# Patient Record
Sex: Female | Born: 1935 | Race: White | Hispanic: No | Marital: Single | Smoking: Never smoker
Health system: Southern US, Community
[De-identification: ages and names within clinical notes are randomized; demographics above are authoritative.]

## PROBLEM LIST (undated history)

## (undated) DIAGNOSIS — I10 Essential (primary) hypertension: Secondary | ICD-10-CM

## (undated) DIAGNOSIS — C50919 Malignant neoplasm of unspecified site of unspecified female breast: Secondary | ICD-10-CM

## (undated) HISTORY — PX: MASTECTOMY: SHX3

## (undated) HISTORY — PX: HIP SURGERY: SHX245

## (undated) HISTORY — PX: KNEE ARTHROSCOPY: SUR90

## (undated) HISTORY — PX: CORONARY ANGIOPLASTY: SHX604

## (undated) HISTORY — PX: EYE SURGERY: SHX253

## (undated) HISTORY — PX: OTHER SURGICAL HISTORY: SHX169

---

## 1971-03-02 DIAGNOSIS — C50919 Malignant neoplasm of unspecified site of unspecified female breast: Secondary | ICD-10-CM

## 1971-03-02 HISTORY — DX: Malignant neoplasm of unspecified site of unspecified female breast: C50.919

## 2000-01-18 ENCOUNTER — Other Ambulatory Visit: Admission: RE | Admit: 2000-01-18 | Discharge: 2000-01-18 | Payer: Self-pay | Admitting: *Deleted

## 2004-05-26 ENCOUNTER — Encounter: Admission: RE | Admit: 2004-05-26 | Discharge: 2004-05-26 | Payer: Self-pay | Admitting: Internal Medicine

## 2004-09-14 ENCOUNTER — Ambulatory Visit (HOSPITAL_COMMUNITY): Admission: RE | Admit: 2004-09-14 | Discharge: 2004-09-14 | Payer: Self-pay | Admitting: *Deleted

## 2004-09-14 ENCOUNTER — Encounter (INDEPENDENT_AMBULATORY_CARE_PROVIDER_SITE_OTHER): Payer: Self-pay | Admitting: *Deleted

## 2008-03-05 ENCOUNTER — Inpatient Hospital Stay (HOSPITAL_COMMUNITY): Admission: RE | Admit: 2008-03-05 | Discharge: 2008-03-08 | Payer: Self-pay | Admitting: Orthopedic Surgery

## 2008-03-05 DIAGNOSIS — Z96649 Presence of unspecified artificial hip joint: Secondary | ICD-10-CM | POA: Insufficient documentation

## 2008-08-23 ENCOUNTER — Encounter (HOSPITAL_COMMUNITY): Admission: RE | Admit: 2008-08-23 | Discharge: 2008-10-31 | Payer: Self-pay | Admitting: Internal Medicine

## 2009-10-20 ENCOUNTER — Encounter (HOSPITAL_COMMUNITY): Admission: RE | Admit: 2009-10-20 | Discharge: 2009-12-30 | Payer: Self-pay | Admitting: Internal Medicine

## 2010-01-08 ENCOUNTER — Ambulatory Visit (HOSPITAL_COMMUNITY): Admission: RE | Admit: 2010-01-08 | Discharge: 2010-01-08 | Payer: Self-pay | Admitting: Internal Medicine

## 2010-04-22 ENCOUNTER — Encounter (HOSPITAL_COMMUNITY): Payer: Medicare HMO | Attending: Internal Medicine

## 2010-04-22 DIAGNOSIS — M810 Age-related osteoporosis without current pathological fracture: Secondary | ICD-10-CM | POA: Insufficient documentation

## 2010-06-15 LAB — BASIC METABOLIC PANEL
BUN: 17 mg/dL (ref 6–23)
BUN: 18 mg/dL (ref 6–23)
CO2: 26 mEq/L (ref 19–32)
Calcium: 8 mg/dL — ABNORMAL LOW (ref 8.4–10.5)
Chloride: 108 mEq/L (ref 96–112)
Creatinine, Ser: 1.13 mg/dL (ref 0.4–1.2)
GFR calc Af Amer: 53 mL/min — ABNORMAL LOW (ref >60)
GFR calc non Af Amer: 44 mL/min — ABNORMAL LOW (ref >60)
Glucose, Bld: 96 mg/dL (ref 70–99)
Potassium: 3.9 mEq/L (ref 3.5–5.1)
Sodium: 139 mEq/L (ref 135–145)

## 2010-06-15 LAB — CBC
HCT: 24.2 % — ABNORMAL LOW (ref 36.0–46.0)
Hemoglobin: 8 g/dL — ABNORMAL LOW (ref 12.0–15.0)
MCHC: 33.1 g/dL (ref 30.0–36.0)
MCV: 95.8 fL (ref 78.0–100.0)
MCV: 97 fL (ref 78.0–100.0)
Platelets: 151 10*3/uL (ref 150–400)
RBC: 3.14 MIL/uL — ABNORMAL LOW (ref 3.87–5.11)
RDW: 12.9 % (ref 11.5–15.5)
WBC: 10 10*3/uL (ref 4.0–10.5)
WBC: 7.4 10*3/uL (ref 4.0–10.5)

## 2010-06-15 LAB — TYPE AND SCREEN

## 2010-06-15 LAB — ABO/RH: ABO/RH(D): O POS

## 2010-07-08 ENCOUNTER — Encounter (HOSPITAL_COMMUNITY): Payer: Medicare HMO | Attending: Internal Medicine

## 2010-07-08 DIAGNOSIS — M810 Age-related osteoporosis without current pathological fracture: Secondary | ICD-10-CM | POA: Insufficient documentation

## 2010-07-14 NOTE — Discharge Summary (Signed)
NAMESHERRITA, RIEDERER NO.:  [id number]   MEDICAL RECORD NO.:  [id number]          PATIENT TYPE:  INP   LOCATION:  1618                         FACILITY:  [facility]   PHYSICIAN:  Madlyn Frankel. Charlann Boxer, M.D.  DATE OF BIRTH:  09-14-1924   DATE OF ADMISSION:  03/05/2008  DATE OF DISCHARGE:  03/08/2008                               DISCHARGE SUMMARY   ADMITTING DIAGNOSES:  1. Osteoarthritis.  2. Hypertension.  3. Hypercholesteremia.  4. Coronary artery disease with heart stenting.  5. Breast cancer.  6. Osteopenia.   DISCHARGE DIAGNOSES:  1. Osteoarthritis.  2. Hypertension.  3. Hypercholesteremia.  4. Coronary artery disease with heart stenting.  5. Breast cancer.  6. Osteopenia.  7. Acute blood loss anemia.   HISTORY OF PRESENT ILLNESS:  Ms. Joyce Oliver is an 77 year old female with  some right hip pain and had right total hip replacement in 1992.  X-rays  revealed significant  acetabular wear and cup malposition.  She was  admitted to the hospital for revision of right total hip replacement by  Dr. Durene Romans and assisted by Dwyane Luo, PA-C.  She was presurgically  cleared by her primary care physician, Dr. Nicholos Johns beforehand and  had a full cardiology workup by Dr. Aleen Campi before.   CONSULTS:  None.   PROCEDURE:  Revision of right total hip replacement.  There is a new  shell and liner placed.  Surgeon was Dr. Durene Romans.  Assistant, Dwyane Luo, PA-C.   LABORATORY DATA:  She did have some acute blood loss anemia with  hemoglobin down to 8 and was transfused a couple of units and her blood  came back up. Her final reading was white blood cells 10, hemoglobin  10.3, hematocrit 30.1.  Metabolic panel final reading sodium 139,  potassium 3.9, creatinine 1.18, glucose 129.   RADIOLOGY:  Portable pelvis showed right total hip placement in good  position.  Chest two-view showed COPD, emphysema.   CARDIOLOGY:  Normal sinus rhythm.   HOSPITAL COURSE:  The  patient underwent revision of right total hip  replacement and tolerated procedure well.  She did have some acute blood  loss anemia and was transfused a couple of units.  Hemovac was  discontinued on day one. She did have some bloody drainage from the  portal.  She was total nonweightbearing on this right lower extremity  but did work with physical therapy to work on some transfer ability as  well as upper body strengthening.  She remained neurovascularly intact  to the right lower extremity and otherwise had minimal pain during the  course of her stay.  When I saw her on the 8th she had just a little bit  of pain and was taking some pain medicine, was afebrile.  Dressing was  changed.  She was stable and ready for discharge to skilled nursing  facility rehab.   DISCHARGE DISPOSITION:  Discharge to skilled nursing facility rehab in  stable and improved condition.   DISCHARGE PHYSICAL THERAPY:  She is nonweightbearing on right lower  extremity and we wan her to have  limited range of motion.  Want to be  careful with any kind of extension of her hip, very limited abduction  exercises, but can flex a little bit just as far as moving around.  We  want to make sure we work on her upper extremity strengthening and help  her with her transfers.   DISCHARGE MEDICATIONS:  1. Lovenox 40 mg subcu q.24h. x10 days.  2. Enteric-coated aspirin 325 mg p.o. daily x4 weeks.  3. Robaxin 500 mg one p.o. q.6h. p.r.n. muscle spasm pain.  4. Iron 325 mg one p.o. t.i.d. x3 weeks.  5. Colace 100 mg one p.o. b.i.d. p.r.n. constipation while on      narcotics.  6. MiraLax 70 grams p.o. daily p.r.n. constipation while on narcotics.  7. Norco 5/325 one to two p.o. every 4 hours-6h. p.r.n. moderate to      severe pain.  8. Tylenol 650 mg one p.o. q.6h. p.r.n. mild pain.  9. Lisinopril 20 mg p.o. daily.  10.Pantoprazole 40 mg p.o. daily.  11.Metoprolol 50 mg p.o. b.i.d.  12.Detrol LA 4 mg p.o. q.h.s.   13.Lovastatin 40 mg one p.o. q.h.s.  14.Os-Cal 500 plus D t.i.d.   DISCHARGE FOLLOWUP:  Follow with Dr. Charlann Boxer at phone number [phone] in 2  weeks for wound check.   DISCHARGE DIET:  Heart-healthy.   DISCHARGE WOUND CARE:  Keep wound dry.  Staples will be removed in Dr.  Nilsa Nutting office in a couple of weeks.     ______________________________  Yetta Glassman [address], [place]      Madlyn Frankel. Charlann Boxer, M.D.  Electronically Signed    BLM/MEDQ  D:  03/08/2008  T:  03/08/2008  Job:  [id number]   cc:   Antionette Char, MD  Fax: [phone]   Georgianne Fick, M.D.  Fax: [phone]

## 2010-07-14 NOTE — Op Note (Signed)
NAMEGISSELLE, Joyce Oliver NO.:  [id number]   MEDICAL RECORD NO.:  [id number]          PATIENT TYPE:  INP   LOCATION:  0001                         FACILITY:  [facility]   PHYSICIAN:  Madlyn Frankel. Charlann Boxer, M.D.  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   FINDINGS:  There were no signs of clinical infection.  There is normal  synovial fluid.  The acetabular component was noted to be completely  loose.  The femoral stem was stable.   OPERATION PERFORMED:  Revision right total hip replacement with a DePuy  Pinnacle Gription multihole cup size 56 with a 36 +4 10 degree high wall  liner and a replacement Stryker C-taper 36 mm +0 head.   ASSISTANT:  Yetta Glassman. Mann, PA   ANESTHESIA:  General.   ESTIMATED BLOOD LOSS:  400 cc  utilizing Cell Saver, none was able to be  captured and none returned.   COMPLICATIONS:  None.   SPECIMENS:  None.   DRAINS:  One Hemovac.   The patient was stable to the recovery room.   INDICATIONS FOR PROCEDURE:  Joyce Oliver is an 77 year old female who  presented on referral for evaluation of her right hip pain.  Radiographs  revealed an obvious change in her acetabular component.  After reviewing  with her these findings, I suggested she consider arthroplasty for  stability of the hip and long term ability for mobilization at 77 years  of age.  The risks of infection, DVT, component failure, dislocation,  need for revision were all discussed and reviewed.  The complicated  nature of this type of procedure was focused on based on potential  significant acetabular bone loss.   DESCRIPTION OF PROCEDURE:  The patient was brought to the operating  theater.  Once adequate anesthesia,  appropriate antibiotics, Ancef  administered, the patient was positioned in the left lateral decubitus  position with the right side up.  The right lower extremity was then  prescrubbed, prepped and draped in sterile fashion.  A lateral based  incision was then made excising the old scar.  A portion of the old  incision was utilized.  The iliotibial band and gluteal fascia were  dissected out and incised posteriorly.  The posterior pseudocapsular  tissues and soft tissue was then removed off the posterior femur  exposing the proximal femur.  With some motion of the hip joint  __________ the cup was loose.   At this point after debriding and exposing the hip joint, removing scar  tissue, I dislocated the hip and removed the femoral head.  This allowed  further exposure.  I was able to elevate some soft tissues up off from  the ilium and placed the trunnion of the femur onto the ilium.  This was  held with retractors.  Her bone quality was noted to be  poor enough that  I ended up having Dwyane Luo, my assistant, pulling on the femur for the  most part as opposed to having it rest on the bone or use retractors.  The acetabular component was removed using rongeur.   Attention was now directed to the acetabulum.  I used a curette to  remove fibrinous tissue from the acetabulum getting down to exposed  bone.  I evaluated the status of the bone noting that the patient had a  crack in the medial wall.  There was no pelvic discontinuity.  There was  a posterior column, intact anterior column and anterior wall relatively  intact.  The superior ilium was intact.  The ischium and pubis were  intact.   Following this extensive debridement, getting down to exposed bone, I  began reaming with a smaller reamer, a 46 reamer.  I then reamed up to a  55 reamer.  At this point I got decent chatter but did not feel that I  wanted to increase my reamer to prevent further bone loss or  complications.  At this  point I chose to use the 56 Pinnacle Gription  cup.   This was opened on the back table.  Upon trying to impact this  component, I found the overall initial fixation was poor.  It was able  to be stabilized into a single position but was readily movable.   I subsequently was able to place 3 screws.  One I was able to place into  the superolateral ilium.  It must have been into some sclerotic bone and  had an excellent bony purchase.   I did place one more into the anteromedial aspect.  This is probably  radiographically going to be penetrating the anterior wall of the ileum  but there were no complications noted.  A third screw was placed close  to the pubis but again more medial to provide some third point of  fixation distally.   Again the primary focus of fixation was the superolateral screw.  With  this, the cup was stable within the acetabulum.   It was noted that I did mix a combination of 5 cc of cancellous chips in  addition to 5 cc of demineralized bone type matrix.  This was packed  into some of the smaller defects that were identified.   With the cups secured with screws, I placed a trial liner.  It was a 36  plus 4 with 10 degree liner.  With the 10 degree lip basically at the 9  o'clock position for this right hip.  I did a trial reduction.  At this  point the cup seemed to be stable.  The hip was stable through a simple  range of motion and this was not stressed.  There was no evidence of  impingement, no subluxation in sleep position with abduction and  internal rotation.  Given these parameters, I removed the trial liner  and placed the final 36 plus 4 10 degree high wall line with the lip in  the same position as the trial reduction.  A final 36 plus 0 ball was  then impacted on the cleaned and dried trunnion.  The hip was reduced.  We had irrigated the hip throughout the case and again at this point.  I  reapproximated some of the pseudocapsular scar tissue to  itself on the  posterior aspect of the hip, placed a medium Hemovac drain deep and then  reapproximated the iliotibial  band and gluteal fascia over top of this.  Subcutaneous layers closed with 2-0 Vicryl, staples in the skin, skin  was cleaned, dried and dressed sterilely with a Mepilex dressing.  She  was placed into a knee immobilizer.   Postoperatively I plan on her being nonweightbearing for probably 2  months until I know that she has got potential acetabular bony ingrowth  and decreased pain.  I will keep her in a knee immobilizer to prevent  excessive motion and excessive stress to the hip joint.      Madlyn Frankel Charlann Boxer, M.D.  Electronically Signed     MDO/MEDQ  D:  03/05/2008  T:  03/05/2008  Job:  [id number]

## 2010-07-17 NOTE — H&P (Signed)
NAMEAASHIKA, CARTA NO.:  [id number]   MEDICAL RECORD NO.:  [id number]          PATIENT TYPE:  INP   LOCATION:                               FACILITY:  [facility]   PHYSICIAN:  Madlyn Frankel. Charlann Boxer, M.D.  DATE OF BIRTH:  09-17-1924   DATE OF ADMISSION:  03/05/2008  DATE OF DISCHARGE:                              HISTORY & PHYSICAL   PROCEDURE:  Revision right total hip replacement with bone graft.   CHIEF COMPLAINT:  Right hip pain.   HISTORY OF PRESENT ILLNESS:  This is an 77 year old female with a  history of right hip pain with history of a right total hip replacement  in 1992.  She has had increasing pain.  She was evaluated in our office.  Her x-ray revealed significant acetabular wear with cup mal-position.  This has been refractory to all conservative treatment and has  diminished her quality of life and affected her ability to ambulate.  She has been pre-surgically assessed prior to surgery.   PRIMARY CARE PHYSICIAN:  Georgianne Fick, M.D.   OTHER PHYSICIANS:  Antionette Char, M.D.   PAST MEDICAL HISTORY:  Significant for:  1. Osteoarthritis.  2. Hypertension.  3. Hypercholesterolemia.  4. Coronary artery disease with heart stenting.  5. Breast cancer.   PAST SURGICAL HISTORY:  1. Mastectomy right breast in 1973.  2. Right hip replacement in 1992.  3. Arthroscopic knee surgery on right knee in 1994.  4. Heart catheterization with angioplasty and stent implantation in      1999.  5. Cataract surgery, lens implant, right eye in July 2009 and left eye      was in September of 2007.   FAMILY MEDICAL HISTORY:  Noncontributory.   SOCIAL HISTORY:  She is single. She is a nonsmoker.  She is planning on  a skilled nursing facility rehab at [facility] in [place].   DRUG ALLERGIES:  MORPHINE causes nausea and vomiting.  CODEINE causes  nausea.  PENICILLIN: Unknown.   CURRENT MEDICATIONS:  1. Detrol LA 4 mg one p.o. daily.  2. Lisinopril 20 mg one  p.o. daily.  3. Metoprolol 50 mg p.o. b.i.d.  4. Pantoprazole 40 mg one p.o. daily.  5. Lovastatin 40 mg one p.o. daily.  6. Aspirin 325 mg one p.o. daily.  7. Tylenol two q.4h. p.r.n.  8. Boniva injection every three months.  9. Os-Cal plus vitamin D 500 p.o. t.i.d.   REVIEW OF SYSTEMS:  DERMATOLOGY:  She has issues with itching.  RESPIRATORY:  She has shortness of breath on exertion.  CARDIOVASCULAR:  Last EKG was December 25, 2007.  GASTROINTESTINAL:  She has issues with  diarrhea, constipation and heartburn.  GENITOURINARY:  She has some  incontinence and increased urination at night.  MUSCULOSKELETAL:  She  has multiple joint pains and  joint swelling.  Otherwise, see  HPI.   PHYSICAL EXAMINATION:  VITAL SIGNS:  Pulse 72, respirations 16, blood  pressure 126/68.  GENERAL:  Awake, alert and oriented, well-developed, well-nourished.  HEENT:  Normocephalic.  NECK:  Supple.  No carotid bruits.  CHEST:  Lungs clear to auscultation bilaterally.  BREASTS:  Deferred.  HEART:  Regular rate and rhythm, S1, S2 distinct.  ABDOMEN:  Soft, nontender, nondistended, bowel sounds present.  PELVIS:  Stable.  GENITOURINARY:  Deferred.  EXTREMITIES:  Right hip has increased pain with range of motion.  SKIN:  No cellulitis.  NEUROLOGICAL:  Intact distal sensibilities.   LABORATORY DATA:  Labs, EKG, chest x-ray all pending presurgical  testing.   IMPRESSION:  Failed right total hip replacement.   PLAN OF ACTION:  Revision right total hip replacement with bone graft.  Planning on just revising the acetabular cup provided stem shows no  signs of loosening.  The risks and complications were discussed with the  patient.   No postoperative medications were provided as the patient is planning on  going to [facility] skilled nursing facility rehab postoperatively.     ______________________________  Yetta Glassman [address], [place]      Madlyn Frankel. Charlann Boxer, M.D.  Electronically Signed    BLM/MEDQ  D:   02/09/2008  T:  02/09/2008  Job:  [id number]   cc:   Georgianne Fick, M.D.  Fax: [phone]   Antionette Char, MD  Fax: [phone]

## 2010-07-17 NOTE — Op Note (Signed)
NAME:  MACON, LESESNE NO.:  [id number]   MEDICAL RECORD NO.:  [id number]          PATIENT TYPE:  AMB   LOCATION:  ENDO                         FACILITY:  [facility]   PHYSICIAN:  Georgiana Spinner, M.D.    DATE OF BIRTH:  August 13, 1924   DATE OF PROCEDURE:  09/14/2004  DATE OF DISCHARGE:                                 OPERATIVE REPORT   PROCEDURE:  Colonoscopy.   INDICATIONS FOR PROCEDURE:  Hemoccult positivity, colon polyp.   ANESTHESIA:  Demerol 20, Versed 1.5 mg.   DESCRIPTION OF PROCEDURE:  With the patient mildly sedated in the left  lateral decubitus position, the Olympus videoscopic pediatric colonoscope  was inserted in the rectum, passed a rather diverticular filled tortuous  sigmoid colon to reach the cecum with pressure applied to the abdomen. The  cecum was identified by the ileocecal valve and appendiceal orifice both of  which were photographed. From this point, the colonoscope was slowly  withdrawn taking circumferential views of the colonic mucosa as we withdrew  to the rectum taking circumferential views of the colonic mucosa stopping in  the ascending colon where there was a polyp seen, photographed and removed  using snare cautery technique on a setting of 20/200 blended current, it was  suctioned into the endoscope. There were diverticula seen in the sigmoid  colon that were photographed and in the rectum the endoscope was placed in  retroflexion to view the anal canal from above and hemorrhoids were seen and  photographed. The endoscope was straightened and withdrawn. The patient's  vital signs and pulse oximeter remained stable. The patient tolerated the  procedure well without apparent complications.   FINDINGS:  Diverticulosis of the sigmoid colon. Internal hemorrhoids, polyp  of ascending colon. Await biopsy report. The patient will call me for  results and followup with me as an outpatient.       GMO/MEDQ  D:  09/14/2004  T:  09/14/2004   Job:  [id number]

## 2010-07-17 NOTE — Op Note (Signed)
NAME:  Joyce Oliver, Joyce Oliver NO.:  [id number]   MEDICAL RECORD NO.:  [id number]          PATIENT TYPE:  AMB   LOCATION:  ENDO                         FACILITY:  [facility]   PHYSICIAN:  Georgiana Spinner, M.D.    DATE OF BIRTH:  07/07/1924   DATE OF PROCEDURE:  09/14/2004  DATE OF DISCHARGE:                                 OPERATIVE REPORT   PROCEDURE:  Upper endoscopy.   ENDOSCOPIST:  Georgiana Spinner, M.D.   INDICATIONS:  Hemoccult positivity.   ANESTHESIA:  Demerol 30, Versed 3.5 mg.   DESCRIPTION OF PROCEDURE:  With the patient mildly sedated in the left  lateral decubitus position, the Olympus videoscopic endoscope was inserted  in the mouth, passed under direct vision through the esophagus which  appeared normal until it reached the distal esophagus, and there was an  ulcer right at about the gastroesophageal junction.  This was photographed  and biopsied.  We entered into the stomach.  Fundus, body, antrum, duodenal  bulb, and second portion of the duodenal were visualized.  From this point,  the endoscope was slowly withdrawn taking circumferential views of the  duodenal mucosal until the endoscope had been pulled back into the stomach,  placed in retroflexion to view the stomach from below.  The endoscope was  straightened and withdrawn taking circumferential views of the remaining  gastric and esophageal mucosa.  The patient's vital signs and pulse oximeter  remained stable.  The patient tolerated the procedure well and without  apparent complications.   FINDINGS:  Ulcer of GE junction.   PLAN:  Await biopsy report.  The patient will call me for results and follow  up with me as an outpatient.  Proceed to colonoscopy as planned.       GMO/MEDQ  D:  09/14/2004  T:  09/14/2004  Job:  [id number]

## 2010-10-07 ENCOUNTER — Encounter (HOSPITAL_COMMUNITY)
Admission: RE | Admit: 2010-10-07 | Discharge: 2010-10-07 | Disposition: A | Discharge status: Home / Self Care | Payer: Medicare HMO | Source: Ambulatory Visit | Attending: Internal Medicine | Admitting: Internal Medicine

## 2010-10-07 DIAGNOSIS — M810 Age-related osteoporosis without current pathological fracture: Secondary | ICD-10-CM | POA: Insufficient documentation

## 2010-12-04 LAB — URINALYSIS, ROUTINE W REFLEX MICROSCOPIC
Hgb urine dipstick: NEGATIVE
Protein, ur: NEGATIVE mg/dL
Specific Gravity, Urine: 1.023 (ref 1.005–1.030)
pH: 5.5 (ref 5.0–8.0)

## 2010-12-04 LAB — URINE MICROSCOPIC-ADD ON

## 2010-12-04 LAB — CBC
HCT: 40 % (ref 36.0–46.0)
Hemoglobin: 13 g/dL (ref 12.0–15.0)
RDW: 13.6 % (ref 11.5–15.5)
WBC: 8.6 10*3/uL (ref 4.0–10.5)

## 2010-12-04 LAB — BASIC METABOLIC PANEL
BUN: 26 mg/dL — ABNORMAL HIGH (ref 6–23)
CO2: 29 mEq/L (ref 19–32)
Calcium: 9.8 mg/dL (ref 8.4–10.5)
Chloride: 105 mEq/L (ref 96–112)
Creatinine, Ser: 1.25 mg/dL — ABNORMAL HIGH (ref 0.4–1.2)
GFR calc Af Amer: 50 mL/min — ABNORMAL LOW (ref >60)

## 2010-12-04 LAB — DIFFERENTIAL
Basophils Absolute: 0 10*3/uL (ref 0.0–0.1)
Eosinophils Relative: 4 % (ref 0–5)
Lymphocytes Relative: 17 % (ref 12–46)
Lymphs Abs: 1.4 10*3/uL (ref 0.7–4.0)
Monocytes Absolute: 0.6 10*3/uL (ref 0.1–1.0)
Neutro Abs: 6.2 10*3/uL (ref 1.7–7.7)

## 2010-12-04 LAB — APTT: aPTT: 30 seconds (ref 24–37)

## 2010-12-30 ENCOUNTER — Other Ambulatory Visit (HOSPITAL_COMMUNITY): Payer: Self-pay | Admitting: *Deleted

## 2011-01-06 ENCOUNTER — Inpatient Hospital Stay (HOSPITAL_COMMUNITY): Admission: RE | Admit: 2011-01-06 | Payer: Medicare HMO | Source: Ambulatory Visit

## 2011-07-12 ENCOUNTER — Other Ambulatory Visit (HOSPITAL_COMMUNITY): Payer: Self-pay | Admitting: *Deleted

## 2011-07-14 ENCOUNTER — Encounter (HOSPITAL_COMMUNITY)
Admission: RE | Admit: 2011-07-14 | Discharge: 2011-07-14 | Disposition: A | Discharge status: Home / Self Care | Payer: Medicare HMO | Source: Ambulatory Visit | Attending: Internal Medicine | Admitting: Internal Medicine

## 2011-07-14 DIAGNOSIS — M810 Age-related osteoporosis without current pathological fracture: Secondary | ICD-10-CM | POA: Insufficient documentation

## 2011-07-14 MED ORDER — SODIUM CHLORIDE 0.9 % IV SOLN
Freq: Once | INTRAVENOUS | Status: AC
  Administered 2011-07-14: 250 mL via INTRAVENOUS

## 2011-07-14 MED ORDER — IBANDRONATE SODIUM 3 MG/3ML IV SOLN
3.0000 mg | Freq: Once | INTRAVENOUS | Status: AC
  Administered 2011-07-14: 3 mg via INTRAVENOUS
  Filled 2011-07-14: qty 3

## 2011-10-12 ENCOUNTER — Other Ambulatory Visit (HOSPITAL_COMMUNITY): Payer: Self-pay | Admitting: *Deleted

## 2011-10-13 ENCOUNTER — Encounter (HOSPITAL_COMMUNITY)
Admission: RE | Admit: 2011-10-13 | Discharge: 2011-10-13 | Disposition: A | Discharge status: Home / Self Care | Payer: Medicare HMO | Source: Ambulatory Visit | Attending: Internal Medicine | Admitting: Internal Medicine

## 2011-10-13 DIAGNOSIS — M810 Age-related osteoporosis without current pathological fracture: Secondary | ICD-10-CM | POA: Insufficient documentation

## 2011-10-13 MED ORDER — IBANDRONATE SODIUM 3 MG/3ML IV SOLN
3.0000 mg | Freq: Once | INTRAVENOUS | Status: AC
  Administered 2011-10-13: 3 mg via INTRAVENOUS

## 2011-10-13 MED ORDER — IBANDRONATE SODIUM 3 MG/3ML IV SOLN
INTRAVENOUS | Status: AC
  Administered 2011-10-13: 3 mg via INTRAVENOUS
  Filled 2011-10-13: qty 3

## 2011-10-13 MED ORDER — SODIUM CHLORIDE 0.9 % IV SOLN
Freq: Once | INTRAVENOUS | Status: AC
  Administered 2011-10-13: 13:00:00 via INTRAVENOUS

## 2012-01-11 ENCOUNTER — Other Ambulatory Visit (HOSPITAL_COMMUNITY): Payer: Self-pay | Admitting: *Deleted

## 2012-01-12 ENCOUNTER — Encounter (HOSPITAL_COMMUNITY)
Admission: RE | Admit: 2012-01-12 | Discharge: 2012-01-12 | Disposition: A | Discharge status: Home / Self Care | Payer: Medicare HMO | Source: Ambulatory Visit | Attending: Internal Medicine | Admitting: Internal Medicine

## 2012-01-12 DIAGNOSIS — M810 Age-related osteoporosis without current pathological fracture: Secondary | ICD-10-CM | POA: Insufficient documentation

## 2012-01-12 MED ORDER — IBANDRONATE SODIUM 3 MG/3ML IV SOLN
INTRAVENOUS | Status: AC
  Filled 2012-01-12: qty 3

## 2012-01-12 MED ORDER — IBANDRONATE SODIUM 3 MG/3ML IV SOLN
3.0000 mg | Freq: Once | INTRAVENOUS | Status: AC
  Administered 2012-01-12: 3 mg via INTRAVENOUS

## 2012-01-12 MED ORDER — SODIUM CHLORIDE 0.9 % IV SOLN
Freq: Once | INTRAVENOUS | Status: AC
  Administered 2012-01-12: 13:00:00 via INTRAVENOUS

## 2012-05-03 ENCOUNTER — Other Ambulatory Visit (HOSPITAL_COMMUNITY): Payer: Self-pay | Admitting: *Deleted

## 2012-05-04 ENCOUNTER — Encounter (HOSPITAL_COMMUNITY)
Admission: RE | Admit: 2012-05-04 | Discharge: 2012-05-04 | Disposition: A | Discharge status: Home / Self Care | Payer: Medicare HMO | Source: Ambulatory Visit | Attending: Internal Medicine | Admitting: Internal Medicine

## 2012-05-04 DIAGNOSIS — M810 Age-related osteoporosis without current pathological fracture: Secondary | ICD-10-CM | POA: Insufficient documentation

## 2012-05-04 MED ORDER — IBANDRONATE SODIUM 3 MG/3ML IV SOLN
INTRAVENOUS | Status: AC
  Administered 2012-05-04: 3 mg via INTRAVENOUS
  Filled 2012-05-04: qty 3

## 2012-05-04 MED ORDER — IBANDRONATE SODIUM 3 MG/3ML IV SOLN: 3.0000 mg | Freq: Once | INTRAVENOUS | Status: AC

## 2012-05-04 MED ORDER — SODIUM CHLORIDE 0.9 % IV SOLN
Freq: Once | INTRAVENOUS | Status: AC
  Administered 2012-05-04: 250 mL via INTRAVENOUS

## 2012-10-04 ENCOUNTER — Other Ambulatory Visit (HOSPITAL_COMMUNITY): Payer: Self-pay | Admitting: *Deleted

## 2012-10-05 ENCOUNTER — Ambulatory Visit (HOSPITAL_COMMUNITY)
Admission: RE | Admit: 2012-10-05 | Discharge: 2012-10-05 | Disposition: A | Discharge status: Home / Self Care | Payer: Medicare HMO | Source: Ambulatory Visit | Attending: Internal Medicine | Admitting: Internal Medicine

## 2012-10-05 DIAGNOSIS — M1990 Unspecified osteoarthritis, unspecified site: Secondary | ICD-10-CM | POA: Insufficient documentation

## 2012-10-05 DIAGNOSIS — M899 Disorder of bone, unspecified: Secondary | ICD-10-CM | POA: Insufficient documentation

## 2012-10-05 DIAGNOSIS — Z96649 Presence of unspecified artificial hip joint: Secondary | ICD-10-CM | POA: Insufficient documentation

## 2012-10-05 DIAGNOSIS — M949 Disorder of cartilage, unspecified: Secondary | ICD-10-CM | POA: Insufficient documentation

## 2012-10-05 MED ORDER — IBANDRONATE SODIUM 3 MG/3ML IV SOLN: 3.0000 mg | Freq: Once | INTRAVENOUS | Status: DC

## 2012-10-05 MED ORDER — SODIUM CHLORIDE 0.9 % IV SOLN
INTRAVENOUS | Status: DC
  Administered 2012-10-05: 12:00:00 via INTRAVENOUS

## 2012-10-05 MED ORDER — IBANDRONATE SODIUM 3 MG/3ML IV SOLN
INTRAVENOUS | Status: AC
  Administered 2012-10-05: 3 mg
  Filled 2012-10-05: qty 3

## 2013-06-17 ENCOUNTER — Emergency Department (HOSPITAL_COMMUNITY): Payer: No Typology Code available for payment source

## 2013-06-17 ENCOUNTER — Encounter (HOSPITAL_COMMUNITY): Payer: Self-pay | Admitting: Emergency Medicine

## 2013-06-17 ENCOUNTER — Observation Stay (HOSPITAL_COMMUNITY)
Admission: EM | Admit: 2013-06-17 | Discharge: 2013-06-19 | Disposition: A | Discharge status: Skilled Nursing Facility | Payer: No Typology Code available for payment source | Attending: General Surgery | Admitting: General Surgery

## 2013-06-17 DIAGNOSIS — I10 Essential (primary) hypertension: Secondary | ICD-10-CM | POA: Insufficient documentation

## 2013-06-17 DIAGNOSIS — Z9010 Acquired absence of unspecified breast and nipple: Secondary | ICD-10-CM | POA: Insufficient documentation

## 2013-06-17 DIAGNOSIS — Z853 Personal history of malignant neoplasm of breast: Secondary | ICD-10-CM | POA: Insufficient documentation

## 2013-06-17 DIAGNOSIS — D62 Acute posthemorrhagic anemia: Secondary | ICD-10-CM | POA: Diagnosis present

## 2013-06-17 DIAGNOSIS — S2220XA Unspecified fracture of sternum, initial encounter for closed fracture: Secondary | ICD-10-CM

## 2013-06-17 DIAGNOSIS — T07XXXA Unspecified multiple injuries, initial encounter: Secondary | ICD-10-CM | POA: Insufficient documentation

## 2013-06-17 HISTORY — DX: Malignant neoplasm of unspecified site of unspecified female breast: C50.919

## 2013-06-17 HISTORY — DX: Essential (primary) hypertension: I10

## 2013-06-17 LAB — I-STAT CHEM 8, ED
BUN: 24 mg/dL — AB (ref 6–23)
CREATININE: 1.3 mg/dL — AB (ref 0.50–1.10)
Calcium, Ion: 1.22 mmol/L (ref 1.13–1.30)
Chloride: 105 mEq/L (ref 96–112)
Glucose, Bld: 114 mg/dL — ABNORMAL HIGH (ref 70–99)
HCT: 34 % — ABNORMAL LOW (ref 36.0–46.0)
Hemoglobin: 11.6 g/dL — ABNORMAL LOW (ref 12.0–15.0)
Potassium: 4.1 mEq/L (ref 3.7–5.3)
Sodium: 141 mEq/L (ref 137–147)
TCO2: 27 mmol/L (ref 0–100)

## 2013-06-17 LAB — CBC WITH DIFFERENTIAL/PLATELET
Basophils Absolute: 0 10*3/uL (ref 0.0–0.1)
Basophils Relative: 0 % (ref 0–1)
EOS PCT: 4 % (ref 0–5)
Eosinophils Absolute: 0.3 10*3/uL (ref 0.0–0.7)
HEMATOCRIT: 34.7 % — AB (ref 36.0–46.0)
HEMOGLOBIN: 11.3 g/dL — AB (ref 12.0–15.0)
LYMPHS ABS: 1.2 10*3/uL (ref 0.7–4.0)
LYMPHS PCT: 14 % (ref 12–46)
MCH: 32.5 pg (ref 26.0–34.0)
MCHC: 32.6 g/dL (ref 30.0–36.0)
MCV: 99.7 fL (ref 78.0–100.0)
Monocytes Absolute: 0.6 10*3/uL (ref 0.1–1.0)
Monocytes Relative: 7 % (ref 3–12)
Neutro Abs: 6.3 10*3/uL (ref 1.7–7.7)
Neutrophils Relative %: 75 % (ref 43–77)
Platelets: 162 10*3/uL (ref 150–400)
RBC: 3.48 MIL/uL — AB (ref 3.87–5.11)
RDW: 14.2 % (ref 11.5–15.5)
WBC: 8.4 10*3/uL (ref 4.0–10.5)

## 2013-06-17 MED ORDER — METOPROLOL TARTRATE 25 MG PO TABS
50.0000 mg | ORAL_TABLET | Freq: Every evening | ORAL | Status: DC
  Administered 2013-06-17 – 2013-06-18 (×2): 50 mg via ORAL
  Filled 2013-06-17 (×3): qty 2

## 2013-06-17 MED ORDER — DOCUSATE SODIUM 100 MG PO CAPS
100.0000 mg | ORAL_CAPSULE | Freq: Two times a day (BID) | ORAL | Status: DC
  Administered 2013-06-17 – 2013-06-19 (×4): 100 mg via ORAL
  Filled 2013-06-17 (×4): qty 1

## 2013-06-17 MED ORDER — SODIUM CHLORIDE 0.9 % IJ SOLN
3.0000 mL | Freq: Two times a day (BID) | INTRAMUSCULAR | Status: DC
  Administered 2013-06-17 – 2013-06-19 (×4): 3 mL via INTRAVENOUS

## 2013-06-17 MED ORDER — HYDROCODONE-ACETAMINOPHEN 5-325 MG PO TABS: 0.5000 | ORAL_TABLET | ORAL | Status: DC | PRN

## 2013-06-17 MED ORDER — SODIUM CHLORIDE 0.9 % IJ SOLN: 3.0000 mL | INTRAMUSCULAR | Status: DC | PRN

## 2013-06-17 MED ORDER — TRAMADOL HCL 50 MG PO TABS
100.0000 mg | ORAL_TABLET | Freq: Four times a day (QID) | ORAL | Status: DC | PRN
  Administered 2013-06-18: 100 mg via ORAL
  Filled 2013-06-17: qty 2

## 2013-06-17 MED ORDER — LISINOPRIL 20 MG PO TABS
20.0000 mg | ORAL_TABLET | Freq: Every day | ORAL | Status: DC
  Administered 2013-06-17 – 2013-06-19 (×3): 20 mg via ORAL
  Filled 2013-06-17 (×3): qty 1

## 2013-06-17 MED ORDER — IOHEXOL 300 MG/ML  SOLN
80.0000 mL | Freq: Once | INTRAMUSCULAR | Status: AC | PRN
  Administered 2013-06-17: 80 mL via INTRAVENOUS

## 2013-06-17 MED ORDER — HYDROMORPHONE HCL PF 1 MG/ML IJ SOLN
0.5000 mg | Freq: Once | INTRAMUSCULAR | Status: AC
  Administered 2013-06-17: 0.5 mg via INTRAVENOUS
  Filled 2013-06-17: qty 1

## 2013-06-17 MED ORDER — PANTOPRAZOLE SODIUM 40 MG PO TBEC
40.0000 mg | DELAYED_RELEASE_TABLET | Freq: Every day | ORAL | Status: DC
  Administered 2013-06-17 – 2013-06-19 (×3): 40 mg via ORAL
  Filled 2013-06-17 (×3): qty 1

## 2013-06-17 MED ORDER — SODIUM CHLORIDE 0.9 % IV SOLN: 250.0000 mL | INTRAVENOUS | Status: DC | PRN

## 2013-06-17 MED ORDER — ONDANSETRON HCL 4 MG/2ML IJ SOLN
4.0000 mg | Freq: Three times a day (TID) | INTRAMUSCULAR | Status: DC | PRN
  Administered 2013-06-17: 4 mg via INTRAVENOUS
  Filled 2013-06-17: qty 2
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
## 2013-06-17 NOTE — ED Notes (Signed)
Per Dr. Hulen Skains, give pt Zofran first, then the Dilaudid.

## 2013-06-17 NOTE — ED Notes (Signed)
Dr. Hulen Skains just in to see patient.

## 2013-06-17 NOTE — ED Notes (Signed)
Room assigned.  Report attempted to floor.

## 2013-06-17 NOTE — ED Notes (Signed)
Portable xray completed

## 2013-06-17 NOTE — ED Notes (Signed)
Unable to pull meds from pyxis.  Call to pharmacy, pharmacy not sure why meds will not come up.  Could be that patient is assigned to room.

## 2013-06-18 ENCOUNTER — Observation Stay (HOSPITAL_COMMUNITY): Payer: No Typology Code available for payment source

## 2013-06-18 DIAGNOSIS — D62 Acute posthemorrhagic anemia: Secondary | ICD-10-CM | POA: Diagnosis present

## 2013-06-18 LAB — BASIC METABOLIC PANEL
BUN: 23 mg/dL (ref 6–23)
CALCIUM: 8.7 mg/dL (ref 8.4–10.5)
CO2: 24 mEq/L (ref 19–32)
Chloride: 105 mEq/L (ref 96–112)
Creatinine, Ser: 1.08 mg/dL (ref 0.50–1.10)
GFR calc Af Amer: 52 mL/min — ABNORMAL LOW (ref >90)
GFR calc non Af Amer: 45 mL/min — ABNORMAL LOW (ref >90)
GLUCOSE: 107 mg/dL — AB (ref 70–99)
Potassium: 4 mEq/L (ref 3.7–5.3)
SODIUM: 141 meq/L (ref 137–147)

## 2013-06-18 LAB — CBC
HCT: 30.2 % — ABNORMAL LOW (ref 36.0–46.0)
HEMOGLOBIN: 9.8 g/dL — AB (ref 12.0–15.0)
MCH: 32.5 pg (ref 26.0–34.0)
MCHC: 32.5 g/dL (ref 30.0–36.0)
MCV: 100 fL (ref 78.0–100.0)
Platelets: 151 10*3/uL (ref 150–400)
RBC: 3.02 MIL/uL — ABNORMAL LOW (ref 3.87–5.11)
RDW: 14.3 % (ref 11.5–15.5)
WBC: 7.6 10*3/uL (ref 4.0–10.5)

## 2013-06-18 MED ORDER — HYDROMORPHONE HCL PF 1 MG/ML IJ SOLN: 0.5000 mg | INTRAMUSCULAR | Status: DC | PRN

## 2013-06-18 MED ORDER — POLYETHYLENE GLYCOL 3350 17 G PO PACK
17.0000 g | PACK | Freq: Every day | ORAL | Status: DC
  Administered 2013-06-18 – 2013-06-19 (×2): 17 g via ORAL
  Filled 2013-06-18 (×2): qty 1

## 2013-06-18 MED ORDER — ENOXAPARIN SODIUM 40 MG/0.4ML ~~LOC~~ SOLN
40.0000 mg | SUBCUTANEOUS | Status: DC
  Administered 2013-06-18 – 2013-06-19 (×2): 40 mg via SUBCUTANEOUS
  Filled 2013-06-18 (×2): qty 0.4

## 2013-06-18 MED ORDER — TRAMADOL HCL 50 MG PO TABS
50.0000 mg | ORAL_TABLET | Freq: Four times a day (QID) | ORAL | Status: DC | PRN
  Administered 2013-06-18 – 2013-06-19 (×3): 100 mg via ORAL
  Filled 2013-06-18 (×3): qty 2
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
## 2013-06-18 NOTE — Progress Notes (Signed)
UR completed 
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
## 2013-06-19 LAB — CBC
HCT: 32.5 % — ABNORMAL LOW (ref 36.0–46.0)
Hemoglobin: 10.3 g/dL — ABNORMAL LOW (ref 12.0–15.0)
MCH: 31.7 pg (ref 26.0–34.0)
MCHC: 31.7 g/dL (ref 30.0–36.0)
MCV: 100 fL (ref 78.0–100.0)
Platelets: 154 10*3/uL (ref 150–400)
RBC: 3.25 MIL/uL — ABNORMAL LOW (ref 3.87–5.11)
RDW: 14.3 % (ref 11.5–15.5)
WBC: 10.4 10*3/uL (ref 4.0–10.5)

## 2013-06-19 MED ORDER — RANITIDINE HCL 150 MG PO CAPS: 150.0000 mg | ORAL_CAPSULE | Freq: Two times a day (BID) | ORAL | Status: DC | PRN

## 2013-06-19 MED ORDER — DSS 100 MG PO CAPS
100.0000 mg | ORAL_CAPSULE | Freq: Two times a day (BID) | ORAL | Status: DC
Start: 2013-06-19 — End: 2015-07-07

## 2013-06-19 MED ORDER — TRAMADOL HCL 50 MG PO TABS: 50.0000 mg | ORAL_TABLET | Freq: Four times a day (QID) | ORAL | Status: DC | PRN

## 2013-06-19 MED ORDER — RANITIDINE HCL 150 MG/10ML PO SYRP
75.0000 mg | ORAL_SOLUTION | Freq: Two times a day (BID) | ORAL | Status: DC | PRN
  Filled 2013-06-19: qty 10

## 2013-06-19 MED ORDER — POLYETHYLENE GLYCOL 3350 17 G PO PACK: 17.0000 g | PACK | Freq: Every day | ORAL | Status: DC | PRN
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
## 2013-06-19 NOTE — Discharge Instructions (Signed)
BE SURE TO CONTINUE TO USE YOU INCENTIVE SPIROMETRY TO HELP PREVENT PNEUMONIA AND KEEP YOUR LUNGS EXPANDED.
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
## 2013-06-19 NOTE — Progress Notes (Signed)
Clinical Social Work Department CLINICAL SOCIAL WORK PLACEMENT NOTE 06/19/2013  Patient:  Joyce Oliver, Joyce Oliver  Account Number:  [id number] [place] date:  06/17/2013  Clinical Social Worker:  Ky Barban, Latanya Presser  Date/time:  06/19/2013 02:50 PM  Clinical Social Work is seeking post-discharge placement for this patient at the following level of care:   SKILLED NURSING   (*CSW will update this form in Epic as items are completed)   N/A-pt/family requesting [facility]  Patient/family provided with [place] Department of Clinical Social Work's list of facilities offering this level of care within the geographic area requested by the patient (or if unable, by the patient's family).  N/A-pt/family requesting [facility]  Patient/family informed of their freedom to choose among providers that offer the needed level of care, that participate in Medicare, Medicaid or managed care program needed by the patient, have an available bed and are willing to accept the patient.  N/A-clinicals not sent to this facility  Patient/family informed of [facility]' ownership interest in [facility], as well as of the fact that they are under no obligation to receive care at this facility.  PASARR submitted to EDS on EXISTING PASARR number received from EDS on EXISTING  FL2 transmitted to all facilities in geographic area requested by pt/family on  06/19/2013 FL2 transmitted to all facilities within larger geographic area on   Patient informed that his/her managed care company has contracts with or will negotiate with  certain facilities, including the following:     Patient/family informed of bed offers received:  06/19/2013 Patient chooses bed at [facility] Physician recommends and patient chooses bed at    Patient to be transferred to [facility] on  06/19/2013 Patient to be transferred to facility by [facility]  The following physician request were entered in Epic:   Additional Comments: CSW notified  pt ready for discharge and wants [facility]. Facility states they can take pt. Pt/family informed. PTAR scheduled for 3:30pm. RN informed. Discharge packet placed on chart.   Ky Barban, MSW, [facility] Clinical Social Worker [phone]

## 2013-06-19 NOTE — Progress Notes (Signed)
Discharge to [facility] transported by EMS. Alert and oriented, not in any distress.

## 2013-06-19 NOTE — Progress Notes (Signed)
I have seen and examined the pt and agree with NP-Riebock's progress note. To SNF with set up

## 2013-06-19 NOTE — Progress Notes (Addendum)
Referrals made to SNF. CSW aware first choice is [facility]. Facility submitting for insurance authorization. Need additional information concerning insurance--CSW left voicemail for financial counselor requesting call back and facility calling pt's family to get information. CSW following and will facilitate discharge once insurance Josem Kaufmann is acquired.   Addendum: [facility] able to take pt today. Please sign FL2 on chart and complete discharge summary.  Ky Barban, MSW, [facility] Clinical Social Worker [phone]

## 2013-06-19 NOTE — Progress Notes (Signed)
Report given to [facility]

## 2013-06-19 NOTE — Progress Notes (Signed)
Clinical Social Work Department BRIEF PSYCHOSOCIAL ASSESSMENT 06/19/2013  Patient:  Joyce Oliver, Joyce Oliver     Account Number:  [id number]     Admit date:  06/17/2013  Clinical Social Worker:  Freeman Caldron  Date/Time:  06/19/2013 02:47 PM  Referred by:  Physician  Date Referred:  06/19/2013 Referred for  SNF Placement   Other Referral:   Interview type:  Patient Other interview type:   Family and input from NP also utilized to complete assessment    PSYCHOSOCIAL DATA Living Status:  ALONE Admitted from facility:   Level of care:   Primary support name:  Tonny Branch [phone]) Primary support relationship to patient:  FAMILY Degree of support available:   Good--pt's family and friend at bedside.    CURRENT CONCERNS Current Concerns  Post-Acute Placement   Other Concerns:    SOCIAL WORK ASSESSMENT / PLAN CSW consulted for SNF placement with [facility] as first choice. Facility submitted for insurance auth and verified they can take pt today. CSW informed pt/family. PTAR scheduled for 3:30pm. Family informed of discharge.   Assessment/plan status:  No Further Intervention Required Other assessment/ plan:   Information/referral to community resources:   [facility]    PATIENT'S/FAMILY'S RESPONSE TO PLAN OF CARE: Good--pt friendly and understanding of CSW role in discharge. Family friendly and engaged in conversation as well.       Ky Barban, MSW, [facility] Clinical Social Worker [phone]

## 2013-06-26 ENCOUNTER — Other Ambulatory Visit (HOSPITAL_COMMUNITY): Payer: Self-pay | Admitting: Internal Medicine

## 2013-06-26 DIAGNOSIS — IMO0001 Reserved for inherently not codable concepts without codable children: Secondary | ICD-10-CM

## 2013-06-26 DIAGNOSIS — K219 Gastro-esophageal reflux disease without esophagitis: Principal | ICD-10-CM

## 2013-06-29 ENCOUNTER — Ambulatory Visit (HOSPITAL_COMMUNITY): Admission: RE | Admit: 2013-06-29 | Payer: Medicare HMO | Source: Ambulatory Visit

## 2014-12-05 DIAGNOSIS — M9903 Segmental and somatic dysfunction of lumbar region: Secondary | ICD-10-CM | POA: Diagnosis not present

## 2014-12-05 DIAGNOSIS — M5136 Other intervertebral disc degeneration, lumbar region: Secondary | ICD-10-CM | POA: Diagnosis not present

## 2014-12-05 DIAGNOSIS — M9905 Segmental and somatic dysfunction of pelvic region: Secondary | ICD-10-CM | POA: Diagnosis not present

## 2014-12-05 DIAGNOSIS — M9904 Segmental and somatic dysfunction of sacral region: Secondary | ICD-10-CM | POA: Diagnosis not present

## 2014-12-09 DIAGNOSIS — I10 Essential (primary) hypertension: Secondary | ICD-10-CM | POA: Diagnosis not present

## 2014-12-09 DIAGNOSIS — E782 Mixed hyperlipidemia: Secondary | ICD-10-CM | POA: Diagnosis not present

## 2014-12-16 DIAGNOSIS — I10 Essential (primary) hypertension: Secondary | ICD-10-CM | POA: Diagnosis not present

## 2014-12-16 DIAGNOSIS — E782 Mixed hyperlipidemia: Secondary | ICD-10-CM | POA: Diagnosis not present

## 2014-12-16 DIAGNOSIS — R0602 Shortness of breath: Secondary | ICD-10-CM | POA: Diagnosis not present

## 2014-12-16 DIAGNOSIS — Z23 Encounter for immunization: Secondary | ICD-10-CM | POA: Diagnosis not present

## 2014-12-16 DIAGNOSIS — R05 Cough: Secondary | ICD-10-CM | POA: Diagnosis not present

## 2014-12-24 DIAGNOSIS — M9905 Segmental and somatic dysfunction of pelvic region: Secondary | ICD-10-CM | POA: Diagnosis not present

## 2014-12-24 DIAGNOSIS — M9904 Segmental and somatic dysfunction of sacral region: Secondary | ICD-10-CM | POA: Diagnosis not present

## 2014-12-24 DIAGNOSIS — M5136 Other intervertebral disc degeneration, lumbar region: Secondary | ICD-10-CM | POA: Diagnosis not present

## 2014-12-24 DIAGNOSIS — M9903 Segmental and somatic dysfunction of lumbar region: Secondary | ICD-10-CM | POA: Diagnosis not present

## 2015-01-02 DIAGNOSIS — M9905 Segmental and somatic dysfunction of pelvic region: Secondary | ICD-10-CM | POA: Diagnosis not present

## 2015-01-02 DIAGNOSIS — M9903 Segmental and somatic dysfunction of lumbar region: Secondary | ICD-10-CM | POA: Diagnosis not present

## 2015-01-02 DIAGNOSIS — M5136 Other intervertebral disc degeneration, lumbar region: Secondary | ICD-10-CM | POA: Diagnosis not present

## 2015-01-02 DIAGNOSIS — M9904 Segmental and somatic dysfunction of sacral region: Secondary | ICD-10-CM | POA: Diagnosis not present

## 2015-01-31 DIAGNOSIS — M5136 Other intervertebral disc degeneration, lumbar region: Secondary | ICD-10-CM | POA: Diagnosis not present

## 2015-01-31 DIAGNOSIS — M9904 Segmental and somatic dysfunction of sacral region: Secondary | ICD-10-CM | POA: Diagnosis not present

## 2015-01-31 DIAGNOSIS — M9903 Segmental and somatic dysfunction of lumbar region: Secondary | ICD-10-CM | POA: Diagnosis not present

## 2015-01-31 DIAGNOSIS — M9905 Segmental and somatic dysfunction of pelvic region: Secondary | ICD-10-CM | POA: Diagnosis not present

## 2015-02-25 DIAGNOSIS — M9905 Segmental and somatic dysfunction of pelvic region: Secondary | ICD-10-CM | POA: Diagnosis not present

## 2015-02-25 DIAGNOSIS — M9903 Segmental and somatic dysfunction of lumbar region: Secondary | ICD-10-CM | POA: Diagnosis not present

## 2015-02-25 DIAGNOSIS — M5136 Other intervertebral disc degeneration, lumbar region: Secondary | ICD-10-CM | POA: Diagnosis not present

## 2015-02-25 DIAGNOSIS — M9904 Segmental and somatic dysfunction of sacral region: Secondary | ICD-10-CM | POA: Diagnosis not present

## 2015-02-27 DIAGNOSIS — M9903 Segmental and somatic dysfunction of lumbar region: Secondary | ICD-10-CM | POA: Diagnosis not present

## 2015-02-27 DIAGNOSIS — M9904 Segmental and somatic dysfunction of sacral region: Secondary | ICD-10-CM | POA: Diagnosis not present

## 2015-02-27 DIAGNOSIS — M9905 Segmental and somatic dysfunction of pelvic region: Secondary | ICD-10-CM | POA: Diagnosis not present

## 2015-02-27 DIAGNOSIS — M5136 Other intervertebral disc degeneration, lumbar region: Secondary | ICD-10-CM | POA: Diagnosis not present

## 2015-03-13 DIAGNOSIS — M9903 Segmental and somatic dysfunction of lumbar region: Secondary | ICD-10-CM | POA: Diagnosis not present

## 2015-03-13 DIAGNOSIS — M9904 Segmental and somatic dysfunction of sacral region: Secondary | ICD-10-CM | POA: Diagnosis not present

## 2015-03-13 DIAGNOSIS — M5136 Other intervertebral disc degeneration, lumbar region: Secondary | ICD-10-CM | POA: Diagnosis not present

## 2015-03-13 DIAGNOSIS — M9905 Segmental and somatic dysfunction of pelvic region: Secondary | ICD-10-CM | POA: Diagnosis not present

## 2015-03-20 DIAGNOSIS — M9905 Segmental and somatic dysfunction of pelvic region: Secondary | ICD-10-CM | POA: Diagnosis not present

## 2015-03-20 DIAGNOSIS — M5136 Other intervertebral disc degeneration, lumbar region: Secondary | ICD-10-CM | POA: Diagnosis not present

## 2015-03-20 DIAGNOSIS — M9903 Segmental and somatic dysfunction of lumbar region: Secondary | ICD-10-CM | POA: Diagnosis not present

## 2015-03-20 DIAGNOSIS — M9904 Segmental and somatic dysfunction of sacral region: Secondary | ICD-10-CM | POA: Diagnosis not present

## 2015-03-24 DIAGNOSIS — M9904 Segmental and somatic dysfunction of sacral region: Secondary | ICD-10-CM | POA: Diagnosis not present

## 2015-03-24 DIAGNOSIS — M9903 Segmental and somatic dysfunction of lumbar region: Secondary | ICD-10-CM | POA: Diagnosis not present

## 2015-03-24 DIAGNOSIS — M9905 Segmental and somatic dysfunction of pelvic region: Secondary | ICD-10-CM | POA: Diagnosis not present

## 2015-03-24 DIAGNOSIS — M5136 Other intervertebral disc degeneration, lumbar region: Secondary | ICD-10-CM | POA: Diagnosis not present

## 2015-04-08 DIAGNOSIS — Z1231 Encounter for screening mammogram for malignant neoplasm of breast: Secondary | ICD-10-CM | POA: Diagnosis not present

## 2015-04-08 DIAGNOSIS — Z853 Personal history of malignant neoplasm of breast: Secondary | ICD-10-CM | POA: Diagnosis not present

## 2015-04-14 DIAGNOSIS — I10 Essential (primary) hypertension: Secondary | ICD-10-CM | POA: Diagnosis not present

## 2015-04-14 DIAGNOSIS — E782 Mixed hyperlipidemia: Secondary | ICD-10-CM | POA: Diagnosis not present

## 2015-04-14 DIAGNOSIS — M9903 Segmental and somatic dysfunction of lumbar region: Secondary | ICD-10-CM | POA: Diagnosis not present

## 2015-04-14 DIAGNOSIS — N390 Urinary tract infection, site not specified: Secondary | ICD-10-CM | POA: Diagnosis not present

## 2015-04-14 DIAGNOSIS — N183 Chronic kidney disease, stage 3 (moderate): Secondary | ICD-10-CM | POA: Diagnosis not present

## 2015-04-14 DIAGNOSIS — M9904 Segmental and somatic dysfunction of sacral region: Secondary | ICD-10-CM | POA: Diagnosis not present

## 2015-04-14 DIAGNOSIS — M5136 Other intervertebral disc degeneration, lumbar region: Secondary | ICD-10-CM | POA: Diagnosis not present

## 2015-04-14 DIAGNOSIS — Z0000 Encounter for general adult medical examination without abnormal findings: Secondary | ICD-10-CM | POA: Diagnosis not present

## 2015-04-14 DIAGNOSIS — M9905 Segmental and somatic dysfunction of pelvic region: Secondary | ICD-10-CM | POA: Diagnosis not present

## 2015-04-14 DIAGNOSIS — I2510 Atherosclerotic heart disease of native coronary artery without angina pectoris: Secondary | ICD-10-CM | POA: Diagnosis not present

## 2015-04-17 DIAGNOSIS — M5136 Other intervertebral disc degeneration, lumbar region: Secondary | ICD-10-CM | POA: Diagnosis not present

## 2015-04-17 DIAGNOSIS — M9903 Segmental and somatic dysfunction of lumbar region: Secondary | ICD-10-CM | POA: Diagnosis not present

## 2015-04-17 DIAGNOSIS — M9904 Segmental and somatic dysfunction of sacral region: Secondary | ICD-10-CM | POA: Diagnosis not present

## 2015-04-17 DIAGNOSIS — M9905 Segmental and somatic dysfunction of pelvic region: Secondary | ICD-10-CM | POA: Diagnosis not present

## 2015-04-21 DIAGNOSIS — E782 Mixed hyperlipidemia: Secondary | ICD-10-CM | POA: Diagnosis not present

## 2015-04-21 DIAGNOSIS — Z23 Encounter for immunization: Secondary | ICD-10-CM | POA: Diagnosis not present

## 2015-04-21 DIAGNOSIS — I10 Essential (primary) hypertension: Secondary | ICD-10-CM | POA: Diagnosis not present

## 2015-04-21 DIAGNOSIS — I2510 Atherosclerotic heart disease of native coronary artery without angina pectoris: Secondary | ICD-10-CM | POA: Diagnosis not present

## 2015-04-21 DIAGNOSIS — N183 Chronic kidney disease, stage 3 (moderate): Secondary | ICD-10-CM | POA: Diagnosis not present

## 2015-05-08 DIAGNOSIS — I25119 Atherosclerotic heart disease of native coronary artery with unspecified angina pectoris: Secondary | ICD-10-CM | POA: Diagnosis not present

## 2015-05-08 DIAGNOSIS — I739 Peripheral vascular disease, unspecified: Secondary | ICD-10-CM | POA: Diagnosis not present

## 2015-05-08 DIAGNOSIS — E7800 Pure hypercholesterolemia, unspecified: Secondary | ICD-10-CM | POA: Diagnosis not present

## 2015-05-08 DIAGNOSIS — R0602 Shortness of breath: Secondary | ICD-10-CM | POA: Diagnosis not present

## 2015-05-19 DIAGNOSIS — I2510 Atherosclerotic heart disease of native coronary artery without angina pectoris: Secondary | ICD-10-CM | POA: Diagnosis not present

## 2015-05-19 DIAGNOSIS — R0602 Shortness of breath: Secondary | ICD-10-CM | POA: Diagnosis not present

## 2015-05-28 DIAGNOSIS — R0602 Shortness of breath: Secondary | ICD-10-CM | POA: Diagnosis not present

## 2015-05-28 DIAGNOSIS — I2510 Atherosclerotic heart disease of native coronary artery without angina pectoris: Secondary | ICD-10-CM | POA: Diagnosis not present

## 2015-05-28 DIAGNOSIS — I739 Peripheral vascular disease, unspecified: Secondary | ICD-10-CM | POA: Diagnosis not present

## 2015-06-05 DIAGNOSIS — M9903 Segmental and somatic dysfunction of lumbar region: Secondary | ICD-10-CM | POA: Diagnosis not present

## 2015-06-05 DIAGNOSIS — M9901 Segmental and somatic dysfunction of cervical region: Secondary | ICD-10-CM | POA: Diagnosis not present

## 2015-06-05 DIAGNOSIS — M9904 Segmental and somatic dysfunction of sacral region: Secondary | ICD-10-CM | POA: Diagnosis not present

## 2015-06-05 DIAGNOSIS — M5136 Other intervertebral disc degeneration, lumbar region: Secondary | ICD-10-CM | POA: Diagnosis not present

## 2015-06-11 DIAGNOSIS — I25119 Atherosclerotic heart disease of native coronary artery with unspecified angina pectoris: Secondary | ICD-10-CM | POA: Diagnosis not present

## 2015-06-11 DIAGNOSIS — E7800 Pure hypercholesterolemia, unspecified: Secondary | ICD-10-CM | POA: Diagnosis not present

## 2015-06-11 DIAGNOSIS — R0602 Shortness of breath: Secondary | ICD-10-CM | POA: Diagnosis not present

## 2015-06-11 DIAGNOSIS — I739 Peripheral vascular disease, unspecified: Secondary | ICD-10-CM | POA: Diagnosis not present

## 2015-07-02 DIAGNOSIS — I25119 Atherosclerotic heart disease of native coronary artery with unspecified angina pectoris: Secondary | ICD-10-CM | POA: Diagnosis not present

## 2015-07-06 DIAGNOSIS — I771 Stricture of artery: Secondary | ICD-10-CM | POA: Diagnosis present

## 2015-07-06 DIAGNOSIS — R0609 Other forms of dyspnea: Secondary | ICD-10-CM

## 2015-07-06 DIAGNOSIS — R0600 Dyspnea, unspecified: Secondary | ICD-10-CM | POA: Diagnosis present

## 2015-07-06 NOTE — H&P (Signed)
OFFICE VISIT NOTES COPIED TO EPIC FOR DOCUMENTATION  . History of Present Illness Joyce Oliver; 06/12/15 11:26 AM) The patient is a 82 year old female who presents for a Follow-up for Shortness of breath. She has a remote history of CAD, status post PTCA and stent placement in 1999 after presenting with unstable angina, as well as hyperlipidemia. She had been doing well until about 6 months ago, she developed dyspnea on exertion. Symptoms have become progressively worse and now even walking across the room causes her to become markedly short of breath. She denies any chest pain. Denies PND, orthopnea, dizziness, syncope, or symptoms suggestive of claudication or TIA. She does report mild lower extremity edema, typically after standing for a long time. She also reports extreme fatigue and often falls asleep when sitting for a few moments. Denies any history of hypertension, diabetes, or thyroid disease. She also reported RUE weakness with signficantly reduced pulses and BP.  She was scheduled for echo, nuclear stress test, and upper extremity arterial duplex and presents today for follow up.   Problem List/Past Medical (Joyce Oliver; 06/12/2015 10:13 AM) Shortness of breath on exertion (R06.02)  Atherosclerosis of native coronary artery of native heart with angina pectoris (I25.119)  04/1997: Presented to [facility] with [place], cath and 1 stent placement Hypercholesteremia (E78.00)  Claudication of upper extremity (I73.9)  Accelerated hypertension (I10)  GERD (gastroesophageal reflux disease) (K21.9)   Allergies (Joyce Oliver; 2015-06-12 10:13 AM) Codeine/Codeine Derivatives  Penicillins  Morphine Derivatives   Family History (Joyce Oliver; Jun 12, 2015 10:13 AM) Mother  Deceased. at age 56, from heart related condition. MI at age 48. Father  Deceased. at age 35, from natural causes. Stroke at 42 Sister 2  Both Deceased Siblings  57 Brothers, all Deceased 2 brothers  had heart conditions  Social History (Joyce Oliver; Joyce 13, 2017 10:13 AM) Current tobacco use  Never smoker. Non Drinker/No Alcohol Use  Marital status  Single. Living Situation  Lives alone. Number of Children  0.  Past Surgical History (Joyce Oliver; 2015-06-12 10:13 AM) Mastectomy; Total - Right 1973 Pt had Radiation treatments Total Hip Replacement - Right 1992 Arthroscopic Knee Surgery - Right 1994 Cataract Extraction-Bilateral 2007 Bilateral. Hip Replacement Revision 03/2008 Right.  Medication History (Joyce Oliver; Jun 12, 2015 10:28 AM) AmLODIPine Besylate (5MG Tablet, 1 (one) Tablet Oral daily, Taken starting 05/08/2015) Active. Lovastatin (40MG Tablet, 1 Oral daily) Active. Pantoprazole Sodium (40MG Tablet DR, 1 Oral daily) Active. Metoprolol Tartrate (50MG Tablet, 1 Oral two times daily) Active. Tolterodine Tartrate ER (4MG Capsule ER 24HR, 1 Oral daily) Active. RaNITidine HCl (150MG Tablet, 1 Oral two times daily) Active. Multiple Vitamin (1 (one) Oral daily) Active. Vitamin B-12 (511m Oral daily) Specific dose unknown - Active. Tylenol Extra Strength (500MG Tablet, 2 Oral daily as needed) Active. Ecotrin (325MG Tablet DR, 1 Oral at bedtime) Active. Oscal 500/200 D-3 (500-200MG-UNIT Tablet, 1 Oral daily) Active. Proventil HFA (108 (90 Base)MCG/ACT Aerosol Soln, 2 puffs Inhalation every 4 hours as needed) Active. Medications Reconciled (Verbally)  Diagnostic Studies History (Joyce Oliver [id number] AM) Coronary Angiogram 1999 Stent placement Echocardiogram 04/18/2014 Left ventricle cavity is small. Mild concentric hypertrophy of the left ventricle. Normal global wall motion. Doppler evidence of grade I (impaired) diastolic dysfunction. Diastolic dysfunction findings suggests elevated LA/LV end diastolic pressure. Calculated EF 56%. Right atrial cavity is moderately dilated. Right ventricle cavity is mildly dilated. Normal right ventricular  function. Mild mitral regurgitation. Mild calcification of the mitral valve annulus. Mild mitral valve leaflet thickening. Mild mitral valve  stenosis. Moderate tricuspid regurgitation. Mild pulmonary hypertension. Mild pulmonic regurgitation. Echocardiogram 05/27/2015 1. Left ventricle size is decreased. Mild concentric hypertrophy of the left ventricle. Normal global wall motion. Doppler evidence of grade II (pseudonormal) diastolic dysfunction. However presence of mitral annular calcification may give erroneous parameters. Calculated EF 78%. 2. Left atrial cavity is mildly dilated. Right atrial cavity is mildly dilated. 3. Right ventricle cavity is mildly dilated. Normal right ventricular function. 4. Mild aortic valve leaflet calcification. Mildly restricted aortic valve leaflets. No evidence of aortic valve stenosis. No aortic valve regurgitation noted. 5. Mild calcification of the mitral valve annulus. Mild mitral valve leaflet calcification. Mildly restricted mitral valve leaflets. No evidence of mitral valve stenosis. Mild mitral regurgitation. 6. Moderate to severe tricuspid regurgitation. Mild pulmonary hypertension. Pulmonary artery systolic pressure is estimated at 47 mm Hg. 7. IVC is normal with blunted respiratory response. Nuclear stress test 05/19/2015 1. The resting electrocardiogram demonstrated normal sinus rhythm and normal resting conduction. Poor R wave progression. Stress EKG is non diagnostic for ischemia as it is a pharmacologic stress using Lexiscan. Stress symptoms included dyspnea. 2. Myocardial perfusion imaging is normal. Overall left ventricular systolic function was normal without regional wall motion abnormalities. The left ventricular ejection fraction was 58%. 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    Review of Systems (Joyce Oliver; 06/11/2015 11:11  AM) General Present- Fatigue. Not Present- Anorexia and Fever. Respiratory Present- Decreased Exercise Tolerance and Difficulty Breathing on Exertion. Not Present- Cough and Dyspnea. Cardiovascular Present- Claudications (RUE weakness). Not Present- Chest Pain, Edema, Orthopnea, Palpitations and Paroxysmal Nocturnal Dyspnea. Gastrointestinal Not Present- Black, Tarry Stool, Change in Bowel Habits and Nausea. Neurological Not Present- Focal Neurological Symptoms and Syncope. Endocrine Not Present- Cold Intolerance, Excessive Sweating, Heat Intolerance and Thyroid Problems. Hematology Not Present- Anemia, Easy Bruising, Petechiae and Prolonged Bleeding.  Vitals (Joyce Oliver; 06/11/2015 10:30 AM) 06/11/2015 10:16 AM Weight: 114.31 lb Height: 63in Body Surface Area: 1.52 m Body Mass Index: 20.25 kg/m  Pulse: 68 (Regular)  P.OX: 95% (Room air) BP: 140/60 (Sitting, Left Arm, Standard)       Physical Exam (Joyce Ebony Hail, Oliver; 06/11/2015 1:17 PM) General Mental Status-Alert. General Appearance-Cooperative, Appears stated age, Not in acute distress. Build & Nutrition-Lean and Moderately built.  Head and Neck Thyroid Gland Characteristics - no palpable nodules, no palpable enlargement.  Chest and Lung Exam Palpation Tender - No chest wall tenderness. Auscultation Breath sounds - Clear.  Cardiovascular Inspection Jugular vein - Right - No Distention. Auscultation Heart Sounds - S1 WNL, S2 WNL and No gallop present. Murmurs & Other Heart Sounds - Murmur - No murmur.  Abdomen Palpation/Percussion Normal exam - Non Tender and No hepatosplenomegaly. Auscultation Normal exam - Bowel sounds normal.  Peripheral Vascular Lower Extremity Inspection - Bilateral - Inspection Normal. Palpation - Edema - Bilateral - Trace edema. Femoral pulse - Bilateral - Normal. Popliteal pulse - Bilateral - Normal. Dorsalis pedis pulse - Bilateral - Feeble. Posterior tibial  pulse - Bilateral - Feeble. Lower Extremity Palpation - Radial pulse - Left - 2+. Right - Feeble. Auscultation - Subclavian Artery - Right - Bruit. Carotid arteries - Bilateral-No Carotid bruit. Abdomen-No prominent abdominal aortic pulsation, No epigastric bruit.  Neurologic Neurologic evaluation reveals -alert and oriented x 3 with no impairment of recent or remote memory. Motor-Grossly intact without any focal deficits.  Musculoskeletal Global Assessment Left Lower Extremity - normal range of motion without pain. Right  Lower Extremity - normal range of motion without pain.    Assessment & Plan (Joyce Oliver; 06/11/2015 1:17 PM) Atherosclerosis of native coronary artery of native heart with angina pectoris (I25.119) Story: 04/1997: Presented to [facility] with [place], cath and 1 stent placement Impression: EKG 05/08/2015: Normal sinus rhythm at rate of 77 bpm, normal axis. Cannot exclude inferior infarct old, anterior infarct old. Normal QT interval, no evidence of ischemia. Low-voltage complexes. Pulmonary disease pattern. Future Plans 04/05/5807: METABOLIC PANEL, BASIC (98338) - one time 06/30/2015: CBC & PLATELETS (AUTO) (25053) - one time 06/30/2015: PT (PROTHROMBIN TIME) (97673) - one time Shortness of breath on exertion (R06.02) Story: Echocardiogram 05/27/2015: 1. Left ventricle size is decreased. Mild concentric hypertrophy of the left ventricle. Normal global wall motion. Doppler evidence of grade II (pseudonormal) diastolic dysfunction. However presence of mitral annular calcification may give erroneous parameters. Calculated EF 78%. 2. Left atrial cavity is mildly dilated. Right atrial cavity is mildly dilated. 3. Right ventricle cavity is mildly dilated. Normal right ventricular function. 4. Mild aortic valve leaflet calcification. Mildly restricted aortic valve leaflets. No evidence of aortic valve stenosis. No aortic valve regurgitation noted. 5. Mild calcification of  the mitral valve annulus. Mild mitral valve leaflet calcification. Mildly restricted mitral valve leaflets. No evidence of mitral valve stenosis. Mild mitral regurgitation. 6. Moderate to severe tricuspid regurgitation. Mild pulmonary hypertension. Pulmonary artery systolic pressure is estimated at 47 mm Hg. 7. IVC is normal with blunted respiratory response.  Lexiscan myoview stress test 05/19/2015: 1. The resting electrocardiogram demonstrated normal sinus rhythm and normal resting conduction. Poor R wave progression. Stress EKG is non diagnostic for ischemia as it is a pharmacologic stress using Lexiscan. Stress symptoms included dyspnea. 2. Myocardial perfusion imaging is normal. Overall left ventricular systolic function was normal without regional wall motion abnormalities. The left ventricular ejection fraction was 58%. 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 Accelerated hypertension (I10) Current Plans  Mechanism of underlying disease process and action of medications discussed with the patient. I discussed primary/secondary prevention and also dietary counseling was done. She presents for follow-up of echocardiogram and nuclear stress test performed due to significantly worsening dyspnea on exertion as well as subclavian artery duplex due to markedly abnormal vascular exam with symptoms suggestive of right upper extremity claudication. Stress test was negative for evidence of ischemia. Echocardiogram revealed elevated PA pressures with moderate to severe tricuspid regurgitation and dilated RV. Despite addition of amlodipine, she reports only minimal improvement in symptoms. Recommend right heart catheterization for further evaluation of PA pressures.  Upper extremity duplex revealed significant stenosis of the right  proximal subclavian artery. She continues to report severe fatigue in her right arm. She also reports occasional dizziness when using her right arm. We'll schedule for subclavian arteriogram with possible angioplasty. This can be performed at the same time as right heart catheterization. Patient understands the risks, benefits, alternatives including medical therapy and CT angiography. Patient understands <1-2% risk of death, embolic complications, bleeding, infection, renal failure, urgent surgical revascularization, but not limited to these and wants to proceed. Follow up after procedure for reevaluation and further recommendation.  *I have discussed this case with Dr. Einar Gip and he personally examined the patient and participated in formulating the plan.*  Addendum Note(Joyce Allison Oliver; 07/02/2015 3:13 PM) 07/02/2015: Creatinine 1.19, EGFR 41, potassium 5.0, CBC normal, PT/INR normal  labs stable to proceed  with arteriogram.  Signed by Joyce Oliver, Oliver (06/11/2015 1:18 PM)

## 2015-07-08 ENCOUNTER — Encounter (HOSPITAL_COMMUNITY)
Admission: RE | Disposition: A | Discharge status: Home / Self Care | Payer: Self-pay | Source: Ambulatory Visit | Attending: Cardiology

## 2015-07-08 ENCOUNTER — Observation Stay (HOSPITAL_COMMUNITY)
Admission: RE | Admit: 2015-07-08 | Discharge: 2015-07-09 | Disposition: A | Discharge status: Home / Self Care | Payer: Medicare HMO | Source: Ambulatory Visit | Attending: Cardiology | Admitting: Cardiology

## 2015-07-08 ENCOUNTER — Encounter (HOSPITAL_COMMUNITY): Payer: Self-pay

## 2015-07-08 DIAGNOSIS — I272 Other secondary pulmonary hypertension: Secondary | ICD-10-CM | POA: Diagnosis not present

## 2015-07-08 DIAGNOSIS — I10 Essential (primary) hypertension: Secondary | ICD-10-CM | POA: Insufficient documentation

## 2015-07-08 DIAGNOSIS — I2510 Atherosclerotic heart disease of native coronary artery without angina pectoris: Secondary | ICD-10-CM | POA: Insufficient documentation

## 2015-07-08 DIAGNOSIS — R0602 Shortness of breath: Secondary | ICD-10-CM | POA: Insufficient documentation

## 2015-07-08 DIAGNOSIS — I742 Embolism and thrombosis of arteries of the upper extremities: Secondary | ICD-10-CM | POA: Diagnosis not present

## 2015-07-08 DIAGNOSIS — Z79899 Other long term (current) drug therapy: Secondary | ICD-10-CM | POA: Diagnosis not present

## 2015-07-08 DIAGNOSIS — Z955 Presence of coronary angioplasty implant and graft: Secondary | ICD-10-CM | POA: Insufficient documentation

## 2015-07-08 DIAGNOSIS — R0600 Dyspnea, unspecified: Secondary | ICD-10-CM | POA: Diagnosis present

## 2015-07-08 DIAGNOSIS — R52 Pain, unspecified: Secondary | ICD-10-CM | POA: Diagnosis present

## 2015-07-08 DIAGNOSIS — R0609 Other forms of dyspnea: Secondary | ICD-10-CM

## 2015-07-08 DIAGNOSIS — Z9011 Acquired absence of right breast and nipple: Secondary | ICD-10-CM | POA: Insufficient documentation

## 2015-07-08 DIAGNOSIS — I708 Atherosclerosis of other arteries: Principal | ICD-10-CM | POA: Insufficient documentation

## 2015-07-08 DIAGNOSIS — Z96641 Presence of right artificial hip joint: Secondary | ICD-10-CM | POA: Insufficient documentation

## 2015-07-08 DIAGNOSIS — D649 Anemia, unspecified: Secondary | ICD-10-CM | POA: Diagnosis not present

## 2015-07-08 DIAGNOSIS — K219 Gastro-esophageal reflux disease without esophagitis: Secondary | ICD-10-CM | POA: Diagnosis not present

## 2015-07-08 DIAGNOSIS — E7800 Pure hypercholesterolemia, unspecified: Secondary | ICD-10-CM | POA: Insufficient documentation

## 2015-07-08 DIAGNOSIS — I771 Stricture of artery: Secondary | ICD-10-CM | POA: Diagnosis present

## 2015-07-08 HISTORY — PX: PERIPHERAL VASCULAR CATHETERIZATION: SHX172C

## 2015-07-08 HISTORY — PX: CARDIAC CATHETERIZATION: SHX172

## 2015-07-08 LAB — POCT I-STAT 3, VENOUS BLOOD GAS (G3P V)
BICARBONATE: 25.9 meq/L — AB (ref 20.0–24.0)
O2 Saturation: 70 %
PO2 VEN: 38 mmHg (ref 31.0–45.0)
TCO2: 27 mmol/L (ref 0–100)
pCO2, Ven: 46.1 mmHg (ref 45.0–50.0)
pH, Ven: 7.358 — ABNORMAL HIGH (ref 7.250–7.300)

## 2015-07-08 LAB — POCT I-STAT 3, ART BLOOD GAS (G3+)
Acid-base deficit: 2 mmol/L (ref 0.0–2.0)
BICARBONATE: 23.6 meq/L (ref 20.0–24.0)
O2 SAT: 98 %
PCO2 ART: 42 mmHg (ref 35.0–45.0)
PH ART: 7.358 (ref 7.350–7.450)
PO2 ART: 110 mmHg — AB (ref 80.0–100.0)
TCO2: 25 mmol/L (ref 0–100)

## 2015-07-08 LAB — POCT ACTIVATED CLOTTING TIME
ACTIVATED CLOTTING TIME: 234 s
Activated Clotting Time: 219 seconds
Activated Clotting Time: 193 seconds
Activated Clotting Time: 173 seconds

## 2015-07-08 SURGERY — RIGHT HEART CATH

## 2015-07-08 MED ORDER — FENTANYL CITRATE (PF) 100 MCG/2ML IJ SOLN
50.0000 ug | INTRAMUSCULAR | Status: DC | PRN
  Administered 2015-07-08: 50 ug via INTRAVENOUS
  Filled 2015-07-08: qty 2

## 2015-07-08 MED ORDER — SODIUM CHLORIDE 0.9 % WEIGHT BASED INFUSION
3.0000 mL/kg/h | INTRAVENOUS | Status: DC
  Administered 2015-07-08: 3 mL/kg/h via INTRAVENOUS

## 2015-07-08 MED ORDER — HEPARIN SODIUM (PORCINE) 1000 UNIT/ML IJ SOLN
INTRAMUSCULAR | Status: DC | PRN
  Administered 2015-07-08: 2000 [IU] via INTRAVENOUS
  Administered 2015-07-08: 5000 [IU] via INTRAVENOUS

## 2015-07-08 MED ORDER — SODIUM CHLORIDE 0.9 % IV SOLN: 250.0000 mL | INTRAVENOUS | Status: DC | PRN

## 2015-07-08 MED ORDER — MIDAZOLAM HCL 2 MG/2ML IJ SOLN
INTRAMUSCULAR | Status: DC | PRN
  Administered 2015-07-08: 1 mg via INTRAVENOUS

## 2015-07-08 MED ORDER — CLOPIDOGREL BISULFATE 300 MG PO TABS
ORAL_TABLET | ORAL | Status: AC
  Filled 2015-07-08: qty 1

## 2015-07-08 MED ORDER — ASPIRIN 81 MG PO CHEW
81.0000 mg | CHEWABLE_TABLET | ORAL | Status: AC
  Administered 2015-07-08: 81 mg via ORAL

## 2015-07-08 MED ORDER — CLOPIDOGREL BISULFATE 75 MG PO TABS: 300.0000 mg | ORAL_TABLET | Freq: Once | ORAL | Status: DC

## 2015-07-08 MED ORDER — CLOPIDOGREL BISULFATE 75 MG PO TABS: 75.0000 mg | ORAL_TABLET | Freq: Every day | ORAL | Status: DC

## 2015-07-08 MED ORDER — ASPIRIN 81 MG PO TABS: 81.0000 mg | ORAL_TABLET | Freq: Every day | ORAL | Status: DC

## 2015-07-08 MED ORDER — SODIUM CHLORIDE 0.9% FLUSH: 3.0000 mL | Freq: Two times a day (BID) | INTRAVENOUS | Status: DC

## 2015-07-08 MED ORDER — IODIXANOL 320 MG/ML IV SOLN
INTRAVENOUS | Status: DC | PRN
  Administered 2015-07-08: 260 mL via INTRA_ARTERIAL

## 2015-07-08 MED ORDER — ASPIRIN 81 MG PO CHEW
CHEWABLE_TABLET | ORAL | Status: AC
  Filled 2015-07-08: qty 1

## 2015-07-08 MED ORDER — HEPARIN (PORCINE) IN NACL 2-0.9 UNIT/ML-% IJ SOLN
INTRAMUSCULAR | Status: DC | PRN
  Administered 2015-07-08: 1000 mL

## 2015-07-08 MED ORDER — LIDOCAINE HCL (PF) 1 % IJ SOLN
INTRAMUSCULAR | Status: AC
  Filled 2015-07-08: qty 30

## 2015-07-08 MED ORDER — SODIUM CHLORIDE 0.9 % WEIGHT BASED INFUSION: 1.0000 mL/kg/h | INTRAVENOUS | Status: DC

## 2015-07-08 MED ORDER — SODIUM CHLORIDE 0.9 % IV SOLN
INTRAVENOUS | Status: DC | PRN
  Administered 2015-07-08: 10 mL via INTRAVENOUS

## 2015-07-08 MED ORDER — HEPARIN SODIUM (PORCINE) 1000 UNIT/ML IJ SOLN
INTRAMUSCULAR | Status: AC
  Filled 2015-07-08: qty 1

## 2015-07-08 MED ORDER — MIDAZOLAM HCL 2 MG/2ML IJ SOLN
INTRAMUSCULAR | Status: AC
  Filled 2015-07-08: qty 2

## 2015-07-08 MED ORDER — LIDOCAINE HCL (PF) 1 % IJ SOLN
INTRAMUSCULAR | Status: DC | PRN
  Administered 2015-07-08: 15 mL via SUBCUTANEOUS

## 2015-07-08 MED ORDER — HEPARIN (PORCINE) IN NACL 2-0.9 UNIT/ML-% IJ SOLN
INTRAMUSCULAR | Status: AC
  Filled 2015-07-08: qty 1000

## 2015-07-08 MED ORDER — SODIUM CHLORIDE 0.9 % IV SOLN
1.0000 mL/kg/h | INTRAVENOUS | Status: DC
  Administered 2015-07-08: 1 mL/kg/h via INTRAVENOUS

## 2015-07-08 MED ORDER — SODIUM CHLORIDE 0.9% FLUSH: 3.0000 mL | INTRAVENOUS | Status: DC | PRN

## 2015-07-08 MED ORDER — CLOPIDOGREL BISULFATE 300 MG PO TABS
ORAL_TABLET | ORAL | Status: DC | PRN
  Administered 2015-07-08: 300 mg via ORAL

## 2015-07-08 SURGICAL SUPPLY — 28 items
BALLN ARMADA 2.0X60X150 (BALLOONS) ×1 IMPLANT
BALLN ARMADA 5X80X135 (BALLOONS) ×3
BALLOON ARMADA 5X80X135 (BALLOONS) IMPLANT
CATH ANGIO 5F BER2 65CM (CATHETERS) IMPLANT
CATH ANGIO 5F PIGTAIL 100CM (CATHETERS) ×1 IMPLANT
CATH HEADHUNTER 5F 125CM (CATHETERS) ×1 IMPLANT
CATH QUICKCROSS .035X135CM (MICROCATHETER) ×1 IMPLANT
CATH SWAN GANZ 7F STRAIGHT (CATHETERS) ×1 IMPLANT
GUIDEWIRE ANGLED .035X150CM (WIRE) ×1 IMPLANT
GUIDEWIRE ASTATO XS 20G 300CM (WIRE) ×1 IMPLANT
KIT ENCORE 26 ADVANTAGE (KITS) ×1 IMPLANT
KIT ESSENTIALS PG (KITS) ×1 IMPLANT
KIT MICROINTRODUCER STIFF 5F (SHEATH) ×1 IMPLANT
KIT PV (KITS) ×3 IMPLANT
SHEATH GUIDING CAROTID 6FRX90 (SHEATH) ×1 IMPLANT
SHEATH PINNACLE 5F 10CM (SHEATH) ×1 IMPLANT
SHEATH PINNACLE 6F 10CM (SHEATH) ×1 IMPLANT
SHEATH PINNACLE 7F 10CM (SHEATH) ×1 IMPLANT
STENT INNOVA 5X80X130 (Permanent Stent) ×1 IMPLANT
TRANSDUCER W/STOPCOCK (MISCELLANEOUS) ×3 IMPLANT
TRAY PV CATH (CUSTOM PROCEDURE TRAY) ×3 IMPLANT
TUBING ART PRESS 72  MALE/FEM (TUBING) ×1
TUBING ART PRESS 72 MALE/FEM (TUBING) IMPLANT
TUBING CIL FLEX 10 FLL-RA (TUBING) ×1 IMPLANT
TUBING HIGH PRESSURE 120CM (CONNECTOR) ×1 IMPLANT
WIRE EMERALD 3MM-J .035X150CM (WIRE) ×1 IMPLANT
WIRE ROSEN-J .035X260CM (WIRE) ×1 IMPLANT
WIRE TREASURE-12 .018X300CM (WIRE) ×1 IMPLANT

## 2015-07-08 NOTE — Progress Notes (Signed)
Rn assessed right groin site upon arrival. Site soft with bruising, but patient is complaining of pain and tenderness. Upon reassessment RN noticed bruising is spreading as well as tightness and tenderness. MD notified. New orders received from MD. Fentanyl 50mg  Q4 hours PRN, hold pressure for 10 minutes, cycle VS and continue to monitor.

## 2015-07-08 NOTE — Progress Notes (Addendum)
Site area: rt fem art and rt fem venous sheaths removed Site Prior to Removal:  Level slight bruising at sheath insertion site Pressure Applied For:30 min Manual:  yes Patient Status During Pull:  A/O Post Pull Site:  Level slight bruising at sheath site.   Post Pull Instructions Given:Post instructions given. Pt understands.   Post Pull Pulses Present:  Dressing Applied:  tegaderm and a 4x4 Bedrest begins @ 14:45:00 Comments: RT dp/Rt pt doppler. Pt leaves cath lab holding in stable condition. Rt groin dressing unremarkable.

## 2015-07-08 NOTE — Progress Notes (Signed)
Report called to [facility] for 3-w-10 and transferred via bed

## 2015-07-08 NOTE — Interval H&P Note (Signed)
History and Physical Interval Note:  07/08/2015 9:32 AM  Joyce Oliver  has presented today for surgery, with the diagnosis of claudication right arm/hp  The various methods of treatment have been discussed with the patient and family. After consideration of risks, benefits and other options for treatment, the patient has consented to  Procedure(s): Right Heart Cath (N/A) Upper Extremity Angiography/Subclavion (N/A) and possible angioplasty  as a surgical intervention .  The patient's history has been reviewed, patient examined, no change in status, stable for surgery.  I have reviewed the patient's chart and labs.  Questions were answered to the patient's satisfaction.     Adrian Prows

## 2015-07-09 ENCOUNTER — Encounter (HOSPITAL_COMMUNITY): Payer: Self-pay | Admitting: Cardiology

## 2015-07-09 DIAGNOSIS — I708 Atherosclerosis of other arteries: Secondary | ICD-10-CM | POA: Diagnosis not present

## 2015-07-09 DIAGNOSIS — I742 Embolism and thrombosis of arteries of the upper extremities: Secondary | ICD-10-CM | POA: Diagnosis not present

## 2015-07-09 LAB — BASIC METABOLIC PANEL
Anion gap: 7 (ref 5–15)
Anion gap: 9 (ref 5–15)
BUN: 19 mg/dL (ref 6–20)
BUN: 21 mg/dL — ABNORMAL HIGH (ref 6–20)
CALCIUM: 8.7 mg/dL — AB (ref 8.9–10.3)
CHLORIDE: 114 mmol/L — AB (ref 101–111)
CO2: 25 mmol/L (ref 22–32)
CO2: 22 mmol/L (ref 22–32)
CREATININE: 1.12 mg/dL — AB (ref 0.44–1.00)
CREATININE: 1.19 mg/dL — AB (ref 0.44–1.00)
Calcium: 8.8 mg/dL — ABNORMAL LOW (ref 8.9–10.3)
Chloride: 111 mmol/L (ref 101–111)
GFR, EST AFRICAN AMERICAN: 49 mL/min — AB (ref >60)
GFR, EST AFRICAN AMERICAN: 46 mL/min — AB (ref >60)
GFR, EST NON AFRICAN AMERICAN: 43 mL/min — AB (ref >60)
GFR, EST NON AFRICAN AMERICAN: 40 mL/min — AB (ref >60)
Glucose, Bld: 127 mg/dL — ABNORMAL HIGH (ref 65–99)
Glucose, Bld: 175 mg/dL — ABNORMAL HIGH (ref 65–99)
Potassium: 3.6 mmol/L (ref 3.5–5.1)
Potassium: 4 mmol/L (ref 3.5–5.1)
Sodium: 142 mmol/L (ref 135–145)
Sodium: 146 mmol/L — ABNORMAL HIGH (ref 135–145)

## 2015-07-09 LAB — CBC
HCT: 25.3 % — ABNORMAL LOW (ref 36.0–46.0)
HCT: 26.7 % — ABNORMAL LOW (ref 36.0–46.0)
HEMOGLOBIN: 8 g/dL — AB (ref 12.0–15.0)
Hemoglobin: 8.6 g/dL — ABNORMAL LOW (ref 12.0–15.0)
MCH: 31.9 pg (ref 26.0–34.0)
MCH: 30.4 pg (ref 26.0–34.0)
MCHC: 32.2 g/dL (ref 30.0–36.0)
MCHC: 31.6 g/dL (ref 30.0–36.0)
MCV: 96.2 fL (ref 78.0–100.0)
MCV: 98.9 fL (ref 78.0–100.0)
PLATELETS: 168 10*3/uL (ref 150–400)
PLATELETS: 159 10*3/uL (ref 150–400)
RBC: 2.7 MIL/uL — AB (ref 3.87–5.11)
RBC: 2.63 MIL/uL — AB (ref 3.87–5.11)
RDW: 14.2 % (ref 11.5–15.5)
RDW: 14.4 % (ref 11.5–15.5)
WBC: 7.1 10*3/uL (ref 4.0–10.5)
WBC: 8.5 10*3/uL (ref 4.0–10.5)

## 2015-07-09 MED ORDER — CLOPIDOGREL BISULFATE 75 MG PO TABS
75.0000 mg | ORAL_TABLET | Freq: Once | ORAL | Status: AC
  Administered 2015-07-09: 75 mg via ORAL
  Filled 2015-07-09: qty 1

## 2015-07-09 NOTE — Progress Notes (Signed)
Patient noted to have increased bruising to cath site right groin. Site soft to palpation. No signs of bleeding noted.  Blood pressure slightly lower then previous. Dr. Einar Gip notified of changes. New orders obtained and implemented. Will continue to monitor closely.

## 2015-07-09 NOTE — Progress Notes (Signed)
Patient currently resting in bed with eyes closed. Vital signs remain consistently stable. Patient denies any pain or discomfort. Right groin site soft. Will continue to monitor.

## 2015-07-09 NOTE — Discharge Summary (Signed)
Physician Discharge Summary  Patient ID: Joyce Oliver MRN: [id number] DOB/AGE: [birth year] 82 y.o.  Admit date: 07/08/2015 Discharge date: 07/09/2015  Discharge Diagnoses: 1. PAD PTA and stenting of the right axillary artery with implantation of a 5.0 x 18 mm Innova self-expanding stent  2. Mild pulmonary hypertension with preserved cardiac output and cardiac index by 4.37 and 2.91. PA pressure 37/15/mean 23 mmHg. Findings consistent with probable primary pulmonary hypertension, pulmonary capillary wedge was 11 mmHg 3. CAD (04/1997 presented with [place] and had 1 stent placed, records not available) 4. Benign hypertension 5. HLD 6. Chronic anemia. Normocytic and normochromic and needs further f/u. 7. Shortness of breath and dyspnea on exertion, probably related to underlying medical comorbidities, primary pulmonary hypertension probably less likely, severe anemia probably contributing to her presentation.  Significant Diagnostic Studies: Right heart catheterization, arch aortogram, selective right subclavian arteriogram and right axillary artery angioplasty 07/08/2015: 1. Mild pulmonary hypertension with preserved cardiac output and cardiac index by 4.37 and 2.91. PA pressure 37/15/mean 23 mmHg. Findings consistent with probable primary pulmonary hypertension, pulmonary capillary wedge was 11 mmHg. 2. 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  3. 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 Right subdural artery shows mild diffuse disease. 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 4. Successful PTA and stenting of the right axillary artery with implantation of a 5.0 x 18 mm Innova self-expanding stent to the right axillary artery, stenosis reduced from 100% to 0% (CTO).  Hospital Course:  Patient is a fairly active 82 year old Caucasian female with known coronary artery disease, who had  been complaining of worsening dyspnea, stress test was negative myocardial ischemia. Echo suggested moderate pulmonary hypertension. She was scheduled for right heart catheterization to evaluate for pulmonary hypertension.  She also has abnormal physical exam with absent right upper and radial pulses, symptoms of severe lifestyle limiting claudication involving the right upper extremity. Therefore, she was also scheduled for peripheral arteriogram and possible angioplasty.  Right heart cath revealed mild pulmonary hypertension, likely primary PAH. Upper extremity angiogram revealed occluded right axillary artery and she underwent successful PTA and stenting of the right axillary artery with implantation of a 5.0 x 18 mm Innova self-expanding stent.  Recommendations on discharge: Suspect that she will have significant improvement in symptoms of claudication. With regard to pulmonary hypertension, it is mild and will consider vasodilator therapy. I suspect correction of anemia will improve the pressure. She will need BMP repeated in about one week to exclude contrast nephropathy. Change ASA 325 to 81mg  and add Plavix post-angioplasty. I will continue Plavix only for 4 weeks in view of severe anemia.  Discharge Exam: Blood pressure 117/64, pulse 88, temperature 98.4 F (36.9 C), temperature source Oral, resp. rate 18, height 5\' 3"  (1.6 m), weight 48.762 kg (107 lb 8 oz), SpO2 100 %.    General appearance: alert, cooperative, appears stated age and no distress Resp: clear to auscultation bilaterally Cardio: regular rate and rhythm, S1, S2 normal, no murmur, click, rub or gallop GI: soft, non-tender; bowel sounds normal; no masses,  no organomegaly Extremities: extremities normal, atraumatic, no cyanosis or edema Pulses: 2+ and symmetric Right groin site with minimal echymois without hematoma or bruit. Bilateral radial pulse normal.  Incision/Wound: No hematoma. No bruit at the access site.  Labs:    Lab Results  Component Value Date   WBC 7.1 07/08/2015   HGB 8.0* 07/08/2015  HCT 25.3* 07/08/2015   MCV 96.2 07/08/2015   PLT 159 07/08/2015    Recent Labs Lab 07/08/15 2346  NA 142  K 4.0  CL 111  CO2 22  BUN 21*  CREATININE 1.19*  CALCIUM 8.7*  GLUCOSE 175*    FOLLOW UP PLANS AND APPOINTMENTS    Medication List    TAKE these medications        acetaminophen 500 MG tablet  Commonly known as:  TYLENOL  Take 1,000 mg by mouth daily as needed for mild pain.     albuterol 108 (90 Base) MCG/ACT inhaler  Commonly known as:  PROVENTIL HFA;VENTOLIN HFA  Inhale 2 puffs into the lungs every 4 (four) hours as needed for wheezing or shortness of breath.     amLODipine 5 MG tablet  Commonly known as:  NORVASC  Take 5 mg by mouth daily.     aspirin 81 MG tablet  Take 1 tablet (81 mg total) by mouth daily.     calcium-vitamin D 500-200 MG-UNIT tablet  Commonly known as:  OSCAL WITH D  Take 1 tablet by mouth daily with breakfast.     CENTRUM SILVER ADULT 50+ PO  Take 1 tablet by mouth daily.     clopidogrel 75 MG tablet  Commonly known as:  PLAVIX  Take 1 tablet (75 mg total) by mouth daily.     lovastatin 40 MG tablet  Commonly known as:  MEVACOR  Take 40 mg by mouth at bedtime.     metoprolol 50 MG tablet  Commonly known as:  LOPRESSOR  Take 50 mg by mouth 2 (two) times daily.     pantoprazole 40 MG tablet  Commonly known as:  PROTONIX  Take 40 mg by mouth daily.     ranitidine 150 MG capsule  Commonly known as:  ZANTAC  Take 1 capsule (150 mg total) by mouth 2 (two) times daily between meals as needed for heartburn.     tolterodine 4 MG 24 hr capsule  Commonly known as:  DETROL LA  Take 4 mg by mouth daily.     vitamin B-12 500 MCG tablet  Commonly known as:  CYANOCOBALAMIN  Take 500 mcg by mouth daily.       Follow-up Information    Follow up with Adrian Prows, MD On 07/18/2015.   Specialty:  Cardiology   Why:  at 10:45am   Contact  information:   [place] [place] [id number] [phone]      Adrian Prows, MD 07/09/2015, 7:49 AM [facility] Pager: [phone] Office: [phone] If no answer: [phone]

## 2015-07-09 NOTE — Care Management Note (Addendum)
Case Management Note  Patient Details  Name: Joyce Oliver MRN: [id number] Date of Birth: April 15, 1924  Subjective/Objective:      PAD PTA and stenting of the right axillary artery , Mild Pulm Hypertension              Action/Plan: Discharge Planning: AVS reviewed:  NCM spoke to pt and friend, [name], and niece, [name] at bedside. Pt states she lives alone but has great family support. Her friend [name] drives her to her appts. She has Rollator at home that she purchased out of pocket, but requesting RW for inside the home. Contacted [employer] DME rep for RW for home. Pt states she has a medical alert system.   PCP- [name]  Expected Discharge Date:  07/09/2015              Expected Discharge Plan:  Home/Self Care  In-House Referral:  NA  Discharge planning Services  CM Consult  Post Acute Care Choice:  NA Choice offered to:  NA  DME Arranged:  [place] rolling DME Agency:  [place]:  NA [place] Agency:  NA  Status of Service:  Completed, signed off  Medicare Important Message Given:    Date Medicare IM Given:    Medicare IM give by:    Date Additional Medicare IM Given:    Additional Medicare Important Message give by:     If discussed at [place] of Stay Meetings, dates discussed:    Additional Comments:  [name], RN 07/09/2015, 10:05 AM

## 2015-07-11 DIAGNOSIS — D509 Iron deficiency anemia, unspecified: Secondary | ICD-10-CM | POA: Diagnosis not present

## 2015-07-11 DIAGNOSIS — R0602 Shortness of breath: Secondary | ICD-10-CM | POA: Diagnosis not present

## 2015-07-11 DIAGNOSIS — N183 Chronic kidney disease, stage 3 (moderate): Secondary | ICD-10-CM | POA: Diagnosis not present

## 2015-07-16 ENCOUNTER — Encounter (HOSPITAL_COMMUNITY): Payer: Self-pay | Admitting: Cardiology

## 2015-07-16 DIAGNOSIS — D509 Iron deficiency anemia, unspecified: Secondary | ICD-10-CM | POA: Diagnosis not present

## 2015-07-16 DIAGNOSIS — R600 Localized edema: Secondary | ICD-10-CM | POA: Diagnosis not present

## 2015-07-16 DIAGNOSIS — R0602 Shortness of breath: Secondary | ICD-10-CM | POA: Diagnosis not present

## 2015-07-18 DIAGNOSIS — I25119 Atherosclerotic heart disease of native coronary artery with unspecified angina pectoris: Secondary | ICD-10-CM | POA: Diagnosis not present

## 2015-07-18 DIAGNOSIS — I270 Primary pulmonary hypertension: Secondary | ICD-10-CM | POA: Diagnosis not present

## 2015-07-18 DIAGNOSIS — R0602 Shortness of breath: Secondary | ICD-10-CM | POA: Diagnosis not present

## 2015-07-18 DIAGNOSIS — I739 Peripheral vascular disease, unspecified: Secondary | ICD-10-CM | POA: Diagnosis not present

## 2015-07-23 DIAGNOSIS — I270 Primary pulmonary hypertension: Secondary | ICD-10-CM | POA: Diagnosis not present

## 2015-08-14 DIAGNOSIS — R600 Localized edema: Secondary | ICD-10-CM | POA: Diagnosis not present

## 2015-08-14 DIAGNOSIS — I270 Primary pulmonary hypertension: Secondary | ICD-10-CM | POA: Diagnosis not present

## 2015-08-14 DIAGNOSIS — R0602 Shortness of breath: Secondary | ICD-10-CM | POA: Diagnosis not present

## 2015-08-20 DIAGNOSIS — I10 Essential (primary) hypertension: Secondary | ICD-10-CM | POA: Diagnosis not present

## 2015-08-20 DIAGNOSIS — D509 Iron deficiency anemia, unspecified: Secondary | ICD-10-CM | POA: Diagnosis not present

## 2015-08-26 DIAGNOSIS — M9904 Segmental and somatic dysfunction of sacral region: Secondary | ICD-10-CM | POA: Diagnosis not present

## 2015-08-26 DIAGNOSIS — M9903 Segmental and somatic dysfunction of lumbar region: Secondary | ICD-10-CM | POA: Diagnosis not present

## 2015-08-26 DIAGNOSIS — M5136 Other intervertebral disc degeneration, lumbar region: Secondary | ICD-10-CM | POA: Diagnosis not present

## 2015-08-26 DIAGNOSIS — M9905 Segmental and somatic dysfunction of pelvic region: Secondary | ICD-10-CM | POA: Diagnosis not present

## 2015-08-27 DIAGNOSIS — N183 Chronic kidney disease, stage 3 (moderate): Secondary | ICD-10-CM | POA: Diagnosis not present

## 2015-08-27 DIAGNOSIS — I10 Essential (primary) hypertension: Secondary | ICD-10-CM | POA: Diagnosis not present

## 2015-08-27 DIAGNOSIS — I2510 Atherosclerotic heart disease of native coronary artery without angina pectoris: Secondary | ICD-10-CM | POA: Diagnosis not present

## 2015-08-27 DIAGNOSIS — E782 Mixed hyperlipidemia: Secondary | ICD-10-CM | POA: Diagnosis not present

## 2015-08-28 DIAGNOSIS — R0602 Shortness of breath: Secondary | ICD-10-CM | POA: Diagnosis not present

## 2015-08-29 DIAGNOSIS — M9905 Segmental and somatic dysfunction of pelvic region: Secondary | ICD-10-CM | POA: Diagnosis not present

## 2015-08-29 DIAGNOSIS — M5136 Other intervertebral disc degeneration, lumbar region: Secondary | ICD-10-CM | POA: Diagnosis not present

## 2015-08-29 DIAGNOSIS — M9903 Segmental and somatic dysfunction of lumbar region: Secondary | ICD-10-CM | POA: Diagnosis not present

## 2015-08-29 DIAGNOSIS — M9904 Segmental and somatic dysfunction of sacral region: Secondary | ICD-10-CM | POA: Diagnosis not present

## 2015-09-12 DIAGNOSIS — R600 Localized edema: Secondary | ICD-10-CM | POA: Diagnosis not present

## 2015-09-12 DIAGNOSIS — R109 Unspecified abdominal pain: Secondary | ICD-10-CM | POA: Diagnosis not present

## 2015-09-12 DIAGNOSIS — R0602 Shortness of breath: Secondary | ICD-10-CM | POA: Diagnosis not present

## 2015-09-12 DIAGNOSIS — I270 Primary pulmonary hypertension: Secondary | ICD-10-CM | POA: Diagnosis not present

## 2015-09-16 DIAGNOSIS — M9901 Segmental and somatic dysfunction of cervical region: Secondary | ICD-10-CM | POA: Diagnosis not present

## 2015-09-16 DIAGNOSIS — M50322 Other cervical disc degeneration at C5-C6 level: Secondary | ICD-10-CM | POA: Diagnosis not present

## 2015-09-16 DIAGNOSIS — M542 Cervicalgia: Secondary | ICD-10-CM | POA: Diagnosis not present

## 2015-10-10 DIAGNOSIS — I270 Primary pulmonary hypertension: Secondary | ICD-10-CM | POA: Diagnosis not present

## 2015-10-16 DIAGNOSIS — R0602 Shortness of breath: Secondary | ICD-10-CM | POA: Diagnosis not present

## 2015-10-16 DIAGNOSIS — I10 Essential (primary) hypertension: Secondary | ICD-10-CM | POA: Diagnosis not present

## 2015-10-16 DIAGNOSIS — R600 Localized edema: Secondary | ICD-10-CM | POA: Diagnosis not present

## 2015-10-16 DIAGNOSIS — I270 Primary pulmonary hypertension: Secondary | ICD-10-CM | POA: Diagnosis not present

## 2015-10-21 DIAGNOSIS — M50322 Other cervical disc degeneration at C5-C6 level: Secondary | ICD-10-CM | POA: Diagnosis not present

## 2015-10-21 DIAGNOSIS — M9901 Segmental and somatic dysfunction of cervical region: Secondary | ICD-10-CM | POA: Diagnosis not present

## 2015-10-21 DIAGNOSIS — M9902 Segmental and somatic dysfunction of thoracic region: Secondary | ICD-10-CM | POA: Diagnosis not present

## 2015-10-21 DIAGNOSIS — M5134 Other intervertebral disc degeneration, thoracic region: Secondary | ICD-10-CM | POA: Diagnosis not present

## 2015-11-10 DIAGNOSIS — M50322 Other cervical disc degeneration at C5-C6 level: Secondary | ICD-10-CM | POA: Diagnosis not present

## 2015-11-10 DIAGNOSIS — M5134 Other intervertebral disc degeneration, thoracic region: Secondary | ICD-10-CM | POA: Diagnosis not present

## 2015-11-10 DIAGNOSIS — M9901 Segmental and somatic dysfunction of cervical region: Secondary | ICD-10-CM | POA: Diagnosis not present

## 2015-11-10 DIAGNOSIS — I2510 Atherosclerotic heart disease of native coronary artery without angina pectoris: Secondary | ICD-10-CM | POA: Diagnosis not present

## 2015-11-10 DIAGNOSIS — M9902 Segmental and somatic dysfunction of thoracic region: Secondary | ICD-10-CM | POA: Diagnosis not present

## 2015-11-17 DIAGNOSIS — Z23 Encounter for immunization: Secondary | ICD-10-CM | POA: Diagnosis not present

## 2015-12-01 DIAGNOSIS — R0602 Shortness of breath: Secondary | ICD-10-CM | POA: Diagnosis not present

## 2015-12-01 DIAGNOSIS — I10 Essential (primary) hypertension: Secondary | ICD-10-CM | POA: Diagnosis not present

## 2015-12-01 DIAGNOSIS — R600 Localized edema: Secondary | ICD-10-CM | POA: Diagnosis not present

## 2015-12-01 DIAGNOSIS — I270 Primary pulmonary hypertension: Secondary | ICD-10-CM | POA: Diagnosis not present

## 2015-12-31 DIAGNOSIS — R0602 Shortness of breath: Secondary | ICD-10-CM | POA: Diagnosis not present

## 2015-12-31 DIAGNOSIS — I10 Essential (primary) hypertension: Secondary | ICD-10-CM | POA: Diagnosis not present

## 2015-12-31 DIAGNOSIS — I25119 Atherosclerotic heart disease of native coronary artery with unspecified angina pectoris: Secondary | ICD-10-CM | POA: Diagnosis not present

## 2015-12-31 DIAGNOSIS — I270 Primary pulmonary hypertension: Secondary | ICD-10-CM | POA: Diagnosis not present

## 2016-01-13 DIAGNOSIS — M9905 Segmental and somatic dysfunction of pelvic region: Secondary | ICD-10-CM | POA: Diagnosis not present

## 2016-01-13 DIAGNOSIS — M5136 Other intervertebral disc degeneration, lumbar region: Secondary | ICD-10-CM | POA: Diagnosis not present

## 2016-01-13 DIAGNOSIS — M9903 Segmental and somatic dysfunction of lumbar region: Secondary | ICD-10-CM | POA: Diagnosis not present

## 2016-01-13 DIAGNOSIS — M9904 Segmental and somatic dysfunction of sacral region: Secondary | ICD-10-CM | POA: Diagnosis not present

## 2016-02-13 DIAGNOSIS — H52203 Unspecified astigmatism, bilateral: Secondary | ICD-10-CM | POA: Diagnosis not present

## 2016-02-13 DIAGNOSIS — H353131 Nonexudative age-related macular degeneration, bilateral, early dry stage: Secondary | ICD-10-CM | POA: Diagnosis not present

## 2016-02-13 DIAGNOSIS — H26493 Other secondary cataract, bilateral: Secondary | ICD-10-CM | POA: Diagnosis not present

## 2016-02-13 DIAGNOSIS — H04123 Dry eye syndrome of bilateral lacrimal glands: Secondary | ICD-10-CM | POA: Diagnosis not present

## 2016-03-25 DIAGNOSIS — H26492 Other secondary cataract, left eye: Secondary | ICD-10-CM | POA: Diagnosis not present

## 2016-03-29 DIAGNOSIS — I2510 Atherosclerotic heart disease of native coronary artery without angina pectoris: Secondary | ICD-10-CM | POA: Diagnosis not present

## 2016-03-29 DIAGNOSIS — I10 Essential (primary) hypertension: Secondary | ICD-10-CM | POA: Diagnosis not present

## 2016-03-29 DIAGNOSIS — N390 Urinary tract infection, site not specified: Secondary | ICD-10-CM | POA: Diagnosis not present

## 2016-03-29 DIAGNOSIS — E782 Mixed hyperlipidemia: Secondary | ICD-10-CM | POA: Diagnosis not present

## 2016-04-02 DIAGNOSIS — I270 Primary pulmonary hypertension: Secondary | ICD-10-CM | POA: Diagnosis not present

## 2016-04-02 DIAGNOSIS — R0602 Shortness of breath: Secondary | ICD-10-CM | POA: Diagnosis not present

## 2016-04-02 DIAGNOSIS — I10 Essential (primary) hypertension: Secondary | ICD-10-CM | POA: Diagnosis not present

## 2016-04-02 DIAGNOSIS — I25119 Atherosclerotic heart disease of native coronary artery with unspecified angina pectoris: Secondary | ICD-10-CM | POA: Diagnosis not present

## 2016-04-05 DIAGNOSIS — E782 Mixed hyperlipidemia: Secondary | ICD-10-CM | POA: Diagnosis not present

## 2016-04-05 DIAGNOSIS — I2510 Atherosclerotic heart disease of native coronary artery without angina pectoris: Secondary | ICD-10-CM | POA: Diagnosis not present

## 2016-04-05 DIAGNOSIS — R69 Illness, unspecified: Secondary | ICD-10-CM | POA: Diagnosis not present

## 2016-04-05 DIAGNOSIS — I270 Primary pulmonary hypertension: Secondary | ICD-10-CM | POA: Diagnosis not present

## 2016-04-08 DIAGNOSIS — H26491 Other secondary cataract, right eye: Secondary | ICD-10-CM | POA: Diagnosis not present

## 2016-04-14 DIAGNOSIS — R0600 Dyspnea, unspecified: Secondary | ICD-10-CM | POA: Diagnosis not present

## 2016-04-14 DIAGNOSIS — I270 Primary pulmonary hypertension: Secondary | ICD-10-CM | POA: Diagnosis not present

## 2016-04-14 DIAGNOSIS — R600 Localized edema: Secondary | ICD-10-CM | POA: Diagnosis not present

## 2016-06-30 DIAGNOSIS — R238 Other skin changes: Secondary | ICD-10-CM | POA: Diagnosis not present

## 2016-10-11 DIAGNOSIS — R0602 Shortness of breath: Secondary | ICD-10-CM | POA: Diagnosis not present

## 2016-10-11 DIAGNOSIS — I25119 Atherosclerotic heart disease of native coronary artery with unspecified angina pectoris: Secondary | ICD-10-CM | POA: Diagnosis not present

## 2016-10-11 DIAGNOSIS — I10 Essential (primary) hypertension: Secondary | ICD-10-CM | POA: Diagnosis not present

## 2016-10-11 DIAGNOSIS — I270 Primary pulmonary hypertension: Secondary | ICD-10-CM | POA: Diagnosis not present

## 2016-10-27 DIAGNOSIS — I2510 Atherosclerotic heart disease of native coronary artery without angina pectoris: Secondary | ICD-10-CM | POA: Diagnosis not present

## 2016-10-27 DIAGNOSIS — N183 Chronic kidney disease, stage 3 (moderate): Secondary | ICD-10-CM | POA: Diagnosis not present

## 2016-10-27 DIAGNOSIS — E782 Mixed hyperlipidemia: Secondary | ICD-10-CM | POA: Diagnosis not present

## 2016-11-03 DIAGNOSIS — I2510 Atherosclerotic heart disease of native coronary artery without angina pectoris: Secondary | ICD-10-CM | POA: Diagnosis not present

## 2016-11-03 DIAGNOSIS — E782 Mixed hyperlipidemia: Secondary | ICD-10-CM | POA: Diagnosis not present

## 2016-11-03 DIAGNOSIS — I129 Hypertensive chronic kidney disease with stage 1 through stage 4 chronic kidney disease, or unspecified chronic kidney disease: Secondary | ICD-10-CM | POA: Diagnosis not present

## 2016-11-03 DIAGNOSIS — N183 Chronic kidney disease, stage 3 (moderate): Secondary | ICD-10-CM | POA: Diagnosis not present

## 2016-12-28 DIAGNOSIS — R69 Illness, unspecified: Secondary | ICD-10-CM | POA: Diagnosis not present

## 2017-04-13 DIAGNOSIS — R0602 Shortness of breath: Secondary | ICD-10-CM | POA: Diagnosis not present

## 2017-04-13 DIAGNOSIS — I10 Essential (primary) hypertension: Secondary | ICD-10-CM | POA: Diagnosis not present

## 2017-04-25 DIAGNOSIS — R0602 Shortness of breath: Secondary | ICD-10-CM | POA: Diagnosis not present

## 2017-04-25 DIAGNOSIS — I25119 Atherosclerotic heart disease of native coronary artery with unspecified angina pectoris: Secondary | ICD-10-CM | POA: Diagnosis not present

## 2017-04-25 DIAGNOSIS — I270 Primary pulmonary hypertension: Secondary | ICD-10-CM | POA: Diagnosis not present

## 2017-04-25 DIAGNOSIS — I10 Essential (primary) hypertension: Secondary | ICD-10-CM | POA: Diagnosis not present

## 2017-04-27 DIAGNOSIS — N390 Urinary tract infection, site not specified: Secondary | ICD-10-CM | POA: Diagnosis not present

## 2017-04-27 DIAGNOSIS — N183 Chronic kidney disease, stage 3 (moderate): Secondary | ICD-10-CM | POA: Diagnosis not present

## 2017-04-27 DIAGNOSIS — Z0000 Encounter for general adult medical examination without abnormal findings: Secondary | ICD-10-CM | POA: Diagnosis not present

## 2017-04-27 DIAGNOSIS — I10 Essential (primary) hypertension: Secondary | ICD-10-CM | POA: Diagnosis not present

## 2017-04-27 DIAGNOSIS — Z23 Encounter for immunization: Secondary | ICD-10-CM | POA: Diagnosis not present

## 2017-04-27 DIAGNOSIS — E782 Mixed hyperlipidemia: Secondary | ICD-10-CM | POA: Diagnosis not present

## 2017-04-27 DIAGNOSIS — I2510 Atherosclerotic heart disease of native coronary artery without angina pectoris: Secondary | ICD-10-CM | POA: Diagnosis not present

## 2017-05-04 DIAGNOSIS — R69 Illness, unspecified: Secondary | ICD-10-CM | POA: Diagnosis not present

## 2017-05-04 DIAGNOSIS — K219 Gastro-esophageal reflux disease without esophagitis: Secondary | ICD-10-CM | POA: Diagnosis not present

## 2017-05-04 DIAGNOSIS — M150 Primary generalized (osteo)arthritis: Secondary | ICD-10-CM | POA: Diagnosis not present

## 2017-05-04 DIAGNOSIS — I2510 Atherosclerotic heart disease of native coronary artery without angina pectoris: Secondary | ICD-10-CM | POA: Diagnosis not present

## 2017-05-04 DIAGNOSIS — K5730 Diverticulosis of large intestine without perforation or abscess without bleeding: Secondary | ICD-10-CM | POA: Diagnosis not present

## 2017-05-04 DIAGNOSIS — I270 Primary pulmonary hypertension: Secondary | ICD-10-CM | POA: Diagnosis not present

## 2017-05-04 DIAGNOSIS — G2581 Restless legs syndrome: Secondary | ICD-10-CM | POA: Diagnosis not present

## 2017-05-04 DIAGNOSIS — N183 Chronic kidney disease, stage 3 (moderate): Secondary | ICD-10-CM | POA: Diagnosis not present

## 2017-05-04 DIAGNOSIS — E782 Mixed hyperlipidemia: Secondary | ICD-10-CM | POA: Diagnosis not present

## 2017-05-04 DIAGNOSIS — I129 Hypertensive chronic kidney disease with stage 1 through stage 4 chronic kidney disease, or unspecified chronic kidney disease: Secondary | ICD-10-CM | POA: Diagnosis not present

## 2017-05-12 DIAGNOSIS — M79651 Pain in right thigh: Secondary | ICD-10-CM | POA: Insufficient documentation

## 2017-05-25 DIAGNOSIS — M79651 Pain in right thigh: Secondary | ICD-10-CM | POA: Diagnosis not present

## 2017-05-25 DIAGNOSIS — M9701XD Periprosthetic fracture around internal prosthetic right hip joint, subsequent encounter: Secondary | ICD-10-CM | POA: Diagnosis not present

## 2017-06-01 DIAGNOSIS — R531 Weakness: Secondary | ICD-10-CM | POA: Diagnosis not present

## 2017-06-01 DIAGNOSIS — R609 Edema, unspecified: Secondary | ICD-10-CM | POA: Diagnosis not present

## 2017-06-01 DIAGNOSIS — Z9181 History of falling: Secondary | ICD-10-CM | POA: Diagnosis not present

## 2017-06-01 DIAGNOSIS — R269 Unspecified abnormalities of gait and mobility: Secondary | ICD-10-CM | POA: Diagnosis not present

## 2017-06-01 DIAGNOSIS — N390 Urinary tract infection, site not specified: Secondary | ICD-10-CM | POA: Diagnosis not present

## 2017-06-25 DIAGNOSIS — I2510 Atherosclerotic heart disease of native coronary artery without angina pectoris: Secondary | ICD-10-CM | POA: Diagnosis not present

## 2017-06-25 DIAGNOSIS — I442 Atrioventricular block, complete: Secondary | ICD-10-CM | POA: Diagnosis not present

## 2017-06-25 DIAGNOSIS — Z9181 History of falling: Secondary | ICD-10-CM | POA: Diagnosis not present

## 2017-06-25 DIAGNOSIS — I272 Pulmonary hypertension, unspecified: Secondary | ICD-10-CM | POA: Diagnosis not present

## 2017-06-25 DIAGNOSIS — S51812D Laceration without foreign body of left forearm, subsequent encounter: Secondary | ICD-10-CM | POA: Diagnosis not present

## 2017-06-25 DIAGNOSIS — S82001D Unspecified fracture of right patella, subsequent encounter for closed fracture with routine healing: Secondary | ICD-10-CM | POA: Diagnosis not present

## 2017-06-25 DIAGNOSIS — N183 Chronic kidney disease, stage 3 (moderate): Secondary | ICD-10-CM | POA: Diagnosis not present

## 2017-06-25 DIAGNOSIS — I509 Heart failure, unspecified: Secondary | ICD-10-CM | POA: Diagnosis not present

## 2017-06-25 DIAGNOSIS — K5790 Diverticulosis of intestine, part unspecified, without perforation or abscess without bleeding: Secondary | ICD-10-CM | POA: Diagnosis not present

## 2017-06-25 DIAGNOSIS — M810 Age-related osteoporosis without current pathological fracture: Secondary | ICD-10-CM | POA: Diagnosis not present

## 2017-06-25 DIAGNOSIS — I130 Hypertensive heart and chronic kidney disease with heart failure and stage 1 through stage 4 chronic kidney disease, or unspecified chronic kidney disease: Secondary | ICD-10-CM | POA: Diagnosis not present

## 2017-06-25 DIAGNOSIS — M1990 Unspecified osteoarthritis, unspecified site: Secondary | ICD-10-CM | POA: Diagnosis not present

## 2017-06-25 DIAGNOSIS — S50812D Abrasion of left forearm, subsequent encounter: Secondary | ICD-10-CM | POA: Diagnosis not present

## 2017-06-25 DIAGNOSIS — C9000 Multiple myeloma not having achieved remission: Secondary | ICD-10-CM | POA: Diagnosis not present

## 2017-06-25 DIAGNOSIS — E782 Mixed hyperlipidemia: Secondary | ICD-10-CM | POA: Diagnosis not present

## 2017-06-25 DIAGNOSIS — R69 Illness, unspecified: Secondary | ICD-10-CM | POA: Diagnosis not present

## 2017-06-25 DIAGNOSIS — J45909 Unspecified asthma, uncomplicated: Secondary | ICD-10-CM | POA: Diagnosis not present

## 2017-06-25 DIAGNOSIS — G2581 Restless legs syndrome: Secondary | ICD-10-CM | POA: Diagnosis not present

## 2017-06-25 DIAGNOSIS — K219 Gastro-esophageal reflux disease without esophagitis: Secondary | ICD-10-CM | POA: Diagnosis not present

## 2017-06-25 DIAGNOSIS — I10 Essential (primary) hypertension: Secondary | ICD-10-CM | POA: Diagnosis not present

## 2017-07-04 DIAGNOSIS — R269 Unspecified abnormalities of gait and mobility: Secondary | ICD-10-CM | POA: Diagnosis not present

## 2017-07-04 DIAGNOSIS — R531 Weakness: Secondary | ICD-10-CM | POA: Diagnosis not present

## 2017-07-04 DIAGNOSIS — R609 Edema, unspecified: Secondary | ICD-10-CM | POA: Diagnosis not present

## 2017-07-04 DIAGNOSIS — D649 Anemia, unspecified: Secondary | ICD-10-CM | POA: Diagnosis not present

## 2017-07-04 DIAGNOSIS — Z9181 History of falling: Secondary | ICD-10-CM | POA: Diagnosis not present

## 2017-07-04 DIAGNOSIS — D509 Iron deficiency anemia, unspecified: Secondary | ICD-10-CM | POA: Diagnosis not present

## 2017-07-11 DIAGNOSIS — I10 Essential (primary) hypertension: Secondary | ICD-10-CM | POA: Diagnosis not present

## 2017-07-11 DIAGNOSIS — I2510 Atherosclerotic heart disease of native coronary artery without angina pectoris: Secondary | ICD-10-CM | POA: Diagnosis not present

## 2017-07-11 DIAGNOSIS — S82001D Unspecified fracture of right patella, subsequent encounter for closed fracture with routine healing: Secondary | ICD-10-CM | POA: Diagnosis not present

## 2017-07-11 DIAGNOSIS — R69 Illness, unspecified: Secondary | ICD-10-CM | POA: Diagnosis not present

## 2017-08-03 DIAGNOSIS — M9701XD Periprosthetic fracture around internal prosthetic right hip joint, subsequent encounter: Secondary | ICD-10-CM | POA: Diagnosis not present

## 2017-08-03 DIAGNOSIS — M25561 Pain in right knee: Secondary | ICD-10-CM | POA: Diagnosis not present

## 2017-08-24 DIAGNOSIS — I10 Essential (primary) hypertension: Secondary | ICD-10-CM | POA: Diagnosis not present

## 2017-08-24 DIAGNOSIS — S82001D Unspecified fracture of right patella, subsequent encounter for closed fracture with routine healing: Secondary | ICD-10-CM | POA: Diagnosis not present

## 2017-08-24 DIAGNOSIS — K219 Gastro-esophageal reflux disease without esophagitis: Secondary | ICD-10-CM | POA: Diagnosis not present

## 2017-08-24 DIAGNOSIS — I2510 Atherosclerotic heart disease of native coronary artery without angina pectoris: Secondary | ICD-10-CM | POA: Diagnosis not present

## 2017-08-24 DIAGNOSIS — G2581 Restless legs syndrome: Secondary | ICD-10-CM | POA: Diagnosis not present

## 2017-08-24 DIAGNOSIS — M810 Age-related osteoporosis without current pathological fracture: Secondary | ICD-10-CM | POA: Diagnosis not present

## 2017-08-24 DIAGNOSIS — E782 Mixed hyperlipidemia: Secondary | ICD-10-CM | POA: Diagnosis not present

## 2017-08-24 DIAGNOSIS — R69 Illness, unspecified: Secondary | ICD-10-CM | POA: Diagnosis not present

## 2017-08-24 DIAGNOSIS — M1990 Unspecified osteoarthritis, unspecified site: Secondary | ICD-10-CM | POA: Diagnosis not present

## 2017-08-24 DIAGNOSIS — K5790 Diverticulosis of intestine, part unspecified, without perforation or abscess without bleeding: Secondary | ICD-10-CM | POA: Diagnosis not present

## 2017-08-25 DIAGNOSIS — I2510 Atherosclerotic heart disease of native coronary artery without angina pectoris: Secondary | ICD-10-CM | POA: Diagnosis not present

## 2017-08-25 DIAGNOSIS — I10 Essential (primary) hypertension: Secondary | ICD-10-CM | POA: Diagnosis not present

## 2017-08-25 DIAGNOSIS — R69 Illness, unspecified: Secondary | ICD-10-CM | POA: Diagnosis not present

## 2017-08-25 DIAGNOSIS — M1990 Unspecified osteoarthritis, unspecified site: Secondary | ICD-10-CM | POA: Diagnosis not present

## 2017-08-25 DIAGNOSIS — M810 Age-related osteoporosis without current pathological fracture: Secondary | ICD-10-CM | POA: Diagnosis not present

## 2017-08-25 DIAGNOSIS — S82001D Unspecified fracture of right patella, subsequent encounter for closed fracture with routine healing: Secondary | ICD-10-CM | POA: Diagnosis not present

## 2017-08-25 DIAGNOSIS — G2581 Restless legs syndrome: Secondary | ICD-10-CM | POA: Diagnosis not present

## 2017-08-25 DIAGNOSIS — K219 Gastro-esophageal reflux disease without esophagitis: Secondary | ICD-10-CM | POA: Diagnosis not present

## 2017-08-25 DIAGNOSIS — E782 Mixed hyperlipidemia: Secondary | ICD-10-CM | POA: Diagnosis not present

## 2017-08-25 DIAGNOSIS — K5790 Diverticulosis of intestine, part unspecified, without perforation or abscess without bleeding: Secondary | ICD-10-CM | POA: Diagnosis not present

## 2017-08-26 DIAGNOSIS — S82001D Unspecified fracture of right patella, subsequent encounter for closed fracture with routine healing: Secondary | ICD-10-CM | POA: Diagnosis not present

## 2017-08-26 DIAGNOSIS — I2510 Atherosclerotic heart disease of native coronary artery without angina pectoris: Secondary | ICD-10-CM | POA: Diagnosis not present

## 2017-08-26 DIAGNOSIS — M810 Age-related osteoporosis without current pathological fracture: Secondary | ICD-10-CM | POA: Diagnosis not present

## 2017-08-26 DIAGNOSIS — K219 Gastro-esophageal reflux disease without esophagitis: Secondary | ICD-10-CM | POA: Diagnosis not present

## 2017-08-26 DIAGNOSIS — I10 Essential (primary) hypertension: Secondary | ICD-10-CM | POA: Diagnosis not present

## 2017-08-26 DIAGNOSIS — K5790 Diverticulosis of intestine, part unspecified, without perforation or abscess without bleeding: Secondary | ICD-10-CM | POA: Diagnosis not present

## 2017-08-26 DIAGNOSIS — E782 Mixed hyperlipidemia: Secondary | ICD-10-CM | POA: Diagnosis not present

## 2017-08-26 DIAGNOSIS — R69 Illness, unspecified: Secondary | ICD-10-CM | POA: Diagnosis not present

## 2017-08-26 DIAGNOSIS — M1990 Unspecified osteoarthritis, unspecified site: Secondary | ICD-10-CM | POA: Diagnosis not present

## 2017-08-26 DIAGNOSIS — G2581 Restless legs syndrome: Secondary | ICD-10-CM | POA: Diagnosis not present

## 2017-08-31 DIAGNOSIS — K219 Gastro-esophageal reflux disease without esophagitis: Secondary | ICD-10-CM | POA: Diagnosis not present

## 2017-08-31 DIAGNOSIS — G2581 Restless legs syndrome: Secondary | ICD-10-CM | POA: Diagnosis not present

## 2017-08-31 DIAGNOSIS — M1990 Unspecified osteoarthritis, unspecified site: Secondary | ICD-10-CM | POA: Diagnosis not present

## 2017-08-31 DIAGNOSIS — E782 Mixed hyperlipidemia: Secondary | ICD-10-CM | POA: Diagnosis not present

## 2017-08-31 DIAGNOSIS — I10 Essential (primary) hypertension: Secondary | ICD-10-CM | POA: Diagnosis not present

## 2017-08-31 DIAGNOSIS — R69 Illness, unspecified: Secondary | ICD-10-CM | POA: Diagnosis not present

## 2017-08-31 DIAGNOSIS — S82001D Unspecified fracture of right patella, subsequent encounter for closed fracture with routine healing: Secondary | ICD-10-CM | POA: Diagnosis not present

## 2017-08-31 DIAGNOSIS — K5790 Diverticulosis of intestine, part unspecified, without perforation or abscess without bleeding: Secondary | ICD-10-CM | POA: Diagnosis not present

## 2017-08-31 DIAGNOSIS — I2510 Atherosclerotic heart disease of native coronary artery without angina pectoris: Secondary | ICD-10-CM | POA: Diagnosis not present

## 2017-08-31 DIAGNOSIS — M810 Age-related osteoporosis without current pathological fracture: Secondary | ICD-10-CM | POA: Diagnosis not present

## 2017-09-02 DIAGNOSIS — I10 Essential (primary) hypertension: Secondary | ICD-10-CM | POA: Diagnosis not present

## 2017-09-02 DIAGNOSIS — K5790 Diverticulosis of intestine, part unspecified, without perforation or abscess without bleeding: Secondary | ICD-10-CM | POA: Diagnosis not present

## 2017-09-02 DIAGNOSIS — G2581 Restless legs syndrome: Secondary | ICD-10-CM | POA: Diagnosis not present

## 2017-09-02 DIAGNOSIS — I2510 Atherosclerotic heart disease of native coronary artery without angina pectoris: Secondary | ICD-10-CM | POA: Diagnosis not present

## 2017-09-02 DIAGNOSIS — S82001D Unspecified fracture of right patella, subsequent encounter for closed fracture with routine healing: Secondary | ICD-10-CM | POA: Diagnosis not present

## 2017-09-02 DIAGNOSIS — E782 Mixed hyperlipidemia: Secondary | ICD-10-CM | POA: Diagnosis not present

## 2017-09-02 DIAGNOSIS — M810 Age-related osteoporosis without current pathological fracture: Secondary | ICD-10-CM | POA: Diagnosis not present

## 2017-09-02 DIAGNOSIS — K219 Gastro-esophageal reflux disease without esophagitis: Secondary | ICD-10-CM | POA: Diagnosis not present

## 2017-09-02 DIAGNOSIS — M1990 Unspecified osteoarthritis, unspecified site: Secondary | ICD-10-CM | POA: Diagnosis not present

## 2017-09-02 DIAGNOSIS — R69 Illness, unspecified: Secondary | ICD-10-CM | POA: Diagnosis not present

## 2017-09-05 DIAGNOSIS — R69 Illness, unspecified: Secondary | ICD-10-CM | POA: Diagnosis not present

## 2017-09-05 DIAGNOSIS — I2510 Atherosclerotic heart disease of native coronary artery without angina pectoris: Secondary | ICD-10-CM | POA: Diagnosis not present

## 2017-09-05 DIAGNOSIS — S82001D Unspecified fracture of right patella, subsequent encounter for closed fracture with routine healing: Secondary | ICD-10-CM | POA: Diagnosis not present

## 2017-09-05 DIAGNOSIS — K5790 Diverticulosis of intestine, part unspecified, without perforation or abscess without bleeding: Secondary | ICD-10-CM | POA: Diagnosis not present

## 2017-09-05 DIAGNOSIS — G2581 Restless legs syndrome: Secondary | ICD-10-CM | POA: Diagnosis not present

## 2017-09-05 DIAGNOSIS — K219 Gastro-esophageal reflux disease without esophagitis: Secondary | ICD-10-CM | POA: Diagnosis not present

## 2017-09-05 DIAGNOSIS — I10 Essential (primary) hypertension: Secondary | ICD-10-CM | POA: Diagnosis not present

## 2017-09-05 DIAGNOSIS — E782 Mixed hyperlipidemia: Secondary | ICD-10-CM | POA: Diagnosis not present

## 2017-09-05 DIAGNOSIS — M1990 Unspecified osteoarthritis, unspecified site: Secondary | ICD-10-CM | POA: Diagnosis not present

## 2017-09-05 DIAGNOSIS — M810 Age-related osteoporosis without current pathological fracture: Secondary | ICD-10-CM | POA: Diagnosis not present

## 2017-09-07 DIAGNOSIS — E782 Mixed hyperlipidemia: Secondary | ICD-10-CM | POA: Diagnosis not present

## 2017-09-07 DIAGNOSIS — K5790 Diverticulosis of intestine, part unspecified, without perforation or abscess without bleeding: Secondary | ICD-10-CM | POA: Diagnosis not present

## 2017-09-07 DIAGNOSIS — K219 Gastro-esophageal reflux disease without esophagitis: Secondary | ICD-10-CM | POA: Diagnosis not present

## 2017-09-07 DIAGNOSIS — S82001D Unspecified fracture of right patella, subsequent encounter for closed fracture with routine healing: Secondary | ICD-10-CM | POA: Diagnosis not present

## 2017-09-07 DIAGNOSIS — I2510 Atherosclerotic heart disease of native coronary artery without angina pectoris: Secondary | ICD-10-CM | POA: Diagnosis not present

## 2017-09-07 DIAGNOSIS — M1990 Unspecified osteoarthritis, unspecified site: Secondary | ICD-10-CM | POA: Diagnosis not present

## 2017-09-07 DIAGNOSIS — R69 Illness, unspecified: Secondary | ICD-10-CM | POA: Diagnosis not present

## 2017-09-07 DIAGNOSIS — G2581 Restless legs syndrome: Secondary | ICD-10-CM | POA: Diagnosis not present

## 2017-09-07 DIAGNOSIS — M810 Age-related osteoporosis without current pathological fracture: Secondary | ICD-10-CM | POA: Diagnosis not present

## 2017-09-07 DIAGNOSIS — I10 Essential (primary) hypertension: Secondary | ICD-10-CM | POA: Diagnosis not present

## 2017-09-08 DIAGNOSIS — M1990 Unspecified osteoarthritis, unspecified site: Secondary | ICD-10-CM | POA: Diagnosis not present

## 2017-09-08 DIAGNOSIS — M810 Age-related osteoporosis without current pathological fracture: Secondary | ICD-10-CM | POA: Diagnosis not present

## 2017-09-08 DIAGNOSIS — I10 Essential (primary) hypertension: Secondary | ICD-10-CM | POA: Diagnosis not present

## 2017-09-08 DIAGNOSIS — G2581 Restless legs syndrome: Secondary | ICD-10-CM | POA: Diagnosis not present

## 2017-09-08 DIAGNOSIS — S82001D Unspecified fracture of right patella, subsequent encounter for closed fracture with routine healing: Secondary | ICD-10-CM | POA: Diagnosis not present

## 2017-09-08 DIAGNOSIS — K5790 Diverticulosis of intestine, part unspecified, without perforation or abscess without bleeding: Secondary | ICD-10-CM | POA: Diagnosis not present

## 2017-09-08 DIAGNOSIS — R69 Illness, unspecified: Secondary | ICD-10-CM | POA: Diagnosis not present

## 2017-09-08 DIAGNOSIS — E782 Mixed hyperlipidemia: Secondary | ICD-10-CM | POA: Diagnosis not present

## 2017-09-08 DIAGNOSIS — I2510 Atherosclerotic heart disease of native coronary artery without angina pectoris: Secondary | ICD-10-CM | POA: Diagnosis not present

## 2017-09-08 DIAGNOSIS — K219 Gastro-esophageal reflux disease without esophagitis: Secondary | ICD-10-CM | POA: Diagnosis not present

## 2017-09-09 DIAGNOSIS — M1990 Unspecified osteoarthritis, unspecified site: Secondary | ICD-10-CM | POA: Diagnosis not present

## 2017-09-09 DIAGNOSIS — K219 Gastro-esophageal reflux disease without esophagitis: Secondary | ICD-10-CM | POA: Diagnosis not present

## 2017-09-09 DIAGNOSIS — S82001D Unspecified fracture of right patella, subsequent encounter for closed fracture with routine healing: Secondary | ICD-10-CM | POA: Diagnosis not present

## 2017-09-09 DIAGNOSIS — G2581 Restless legs syndrome: Secondary | ICD-10-CM | POA: Diagnosis not present

## 2017-09-09 DIAGNOSIS — I10 Essential (primary) hypertension: Secondary | ICD-10-CM | POA: Diagnosis not present

## 2017-09-09 DIAGNOSIS — M810 Age-related osteoporosis without current pathological fracture: Secondary | ICD-10-CM | POA: Diagnosis not present

## 2017-09-09 DIAGNOSIS — R69 Illness, unspecified: Secondary | ICD-10-CM | POA: Diagnosis not present

## 2017-09-09 DIAGNOSIS — I2510 Atherosclerotic heart disease of native coronary artery without angina pectoris: Secondary | ICD-10-CM | POA: Diagnosis not present

## 2017-09-09 DIAGNOSIS — E782 Mixed hyperlipidemia: Secondary | ICD-10-CM | POA: Diagnosis not present

## 2017-09-09 DIAGNOSIS — K5790 Diverticulosis of intestine, part unspecified, without perforation or abscess without bleeding: Secondary | ICD-10-CM | POA: Diagnosis not present

## 2017-09-12 DIAGNOSIS — R69 Illness, unspecified: Secondary | ICD-10-CM | POA: Diagnosis not present

## 2017-09-12 DIAGNOSIS — M1990 Unspecified osteoarthritis, unspecified site: Secondary | ICD-10-CM | POA: Diagnosis not present

## 2017-09-12 DIAGNOSIS — K219 Gastro-esophageal reflux disease without esophagitis: Secondary | ICD-10-CM | POA: Diagnosis not present

## 2017-09-12 DIAGNOSIS — G2581 Restless legs syndrome: Secondary | ICD-10-CM | POA: Diagnosis not present

## 2017-09-12 DIAGNOSIS — E782 Mixed hyperlipidemia: Secondary | ICD-10-CM | POA: Diagnosis not present

## 2017-09-12 DIAGNOSIS — K5790 Diverticulosis of intestine, part unspecified, without perforation or abscess without bleeding: Secondary | ICD-10-CM | POA: Diagnosis not present

## 2017-09-12 DIAGNOSIS — S82001D Unspecified fracture of right patella, subsequent encounter for closed fracture with routine healing: Secondary | ICD-10-CM | POA: Diagnosis not present

## 2017-09-12 DIAGNOSIS — I10 Essential (primary) hypertension: Secondary | ICD-10-CM | POA: Diagnosis not present

## 2017-09-12 DIAGNOSIS — I2510 Atherosclerotic heart disease of native coronary artery without angina pectoris: Secondary | ICD-10-CM | POA: Diagnosis not present

## 2017-09-12 DIAGNOSIS — M810 Age-related osteoporosis without current pathological fracture: Secondary | ICD-10-CM | POA: Diagnosis not present

## 2017-09-14 DIAGNOSIS — R69 Illness, unspecified: Secondary | ICD-10-CM | POA: Diagnosis not present

## 2017-09-14 DIAGNOSIS — M810 Age-related osteoporosis without current pathological fracture: Secondary | ICD-10-CM | POA: Diagnosis not present

## 2017-09-14 DIAGNOSIS — S82001D Unspecified fracture of right patella, subsequent encounter for closed fracture with routine healing: Secondary | ICD-10-CM | POA: Diagnosis not present

## 2017-09-14 DIAGNOSIS — I2510 Atherosclerotic heart disease of native coronary artery without angina pectoris: Secondary | ICD-10-CM | POA: Diagnosis not present

## 2017-09-14 DIAGNOSIS — K5790 Diverticulosis of intestine, part unspecified, without perforation or abscess without bleeding: Secondary | ICD-10-CM | POA: Diagnosis not present

## 2017-09-14 DIAGNOSIS — G2581 Restless legs syndrome: Secondary | ICD-10-CM | POA: Diagnosis not present

## 2017-09-14 DIAGNOSIS — M1990 Unspecified osteoarthritis, unspecified site: Secondary | ICD-10-CM | POA: Diagnosis not present

## 2017-09-14 DIAGNOSIS — I10 Essential (primary) hypertension: Secondary | ICD-10-CM | POA: Diagnosis not present

## 2017-09-14 DIAGNOSIS — K219 Gastro-esophageal reflux disease without esophagitis: Secondary | ICD-10-CM | POA: Diagnosis not present

## 2017-09-14 DIAGNOSIS — E782 Mixed hyperlipidemia: Secondary | ICD-10-CM | POA: Diagnosis not present

## 2017-09-15 DIAGNOSIS — E782 Mixed hyperlipidemia: Secondary | ICD-10-CM | POA: Diagnosis not present

## 2017-09-15 DIAGNOSIS — K5790 Diverticulosis of intestine, part unspecified, without perforation or abscess without bleeding: Secondary | ICD-10-CM | POA: Diagnosis not present

## 2017-09-15 DIAGNOSIS — I10 Essential (primary) hypertension: Secondary | ICD-10-CM | POA: Diagnosis not present

## 2017-09-15 DIAGNOSIS — I2510 Atherosclerotic heart disease of native coronary artery without angina pectoris: Secondary | ICD-10-CM | POA: Diagnosis not present

## 2017-09-15 DIAGNOSIS — R69 Illness, unspecified: Secondary | ICD-10-CM | POA: Diagnosis not present

## 2017-09-15 DIAGNOSIS — M1990 Unspecified osteoarthritis, unspecified site: Secondary | ICD-10-CM | POA: Diagnosis not present

## 2017-09-15 DIAGNOSIS — K219 Gastro-esophageal reflux disease without esophagitis: Secondary | ICD-10-CM | POA: Diagnosis not present

## 2017-09-15 DIAGNOSIS — S82001D Unspecified fracture of right patella, subsequent encounter for closed fracture with routine healing: Secondary | ICD-10-CM | POA: Diagnosis not present

## 2017-09-15 DIAGNOSIS — M810 Age-related osteoporosis without current pathological fracture: Secondary | ICD-10-CM | POA: Diagnosis not present

## 2017-09-15 DIAGNOSIS — G2581 Restless legs syndrome: Secondary | ICD-10-CM | POA: Diagnosis not present

## 2017-09-19 DIAGNOSIS — K219 Gastro-esophageal reflux disease without esophagitis: Secondary | ICD-10-CM | POA: Diagnosis not present

## 2017-09-19 DIAGNOSIS — K5790 Diverticulosis of intestine, part unspecified, without perforation or abscess without bleeding: Secondary | ICD-10-CM | POA: Diagnosis not present

## 2017-09-19 DIAGNOSIS — M1990 Unspecified osteoarthritis, unspecified site: Secondary | ICD-10-CM | POA: Diagnosis not present

## 2017-09-19 DIAGNOSIS — E782 Mixed hyperlipidemia: Secondary | ICD-10-CM | POA: Diagnosis not present

## 2017-09-19 DIAGNOSIS — M810 Age-related osteoporosis without current pathological fracture: Secondary | ICD-10-CM | POA: Diagnosis not present

## 2017-09-19 DIAGNOSIS — I10 Essential (primary) hypertension: Secondary | ICD-10-CM | POA: Diagnosis not present

## 2017-09-19 DIAGNOSIS — G2581 Restless legs syndrome: Secondary | ICD-10-CM | POA: Diagnosis not present

## 2017-09-19 DIAGNOSIS — S82001D Unspecified fracture of right patella, subsequent encounter for closed fracture with routine healing: Secondary | ICD-10-CM | POA: Diagnosis not present

## 2017-09-19 DIAGNOSIS — R69 Illness, unspecified: Secondary | ICD-10-CM | POA: Diagnosis not present

## 2017-09-19 DIAGNOSIS — I2510 Atherosclerotic heart disease of native coronary artery without angina pectoris: Secondary | ICD-10-CM | POA: Diagnosis not present

## 2017-09-21 DIAGNOSIS — K5790 Diverticulosis of intestine, part unspecified, without perforation or abscess without bleeding: Secondary | ICD-10-CM | POA: Diagnosis not present

## 2017-09-21 DIAGNOSIS — M810 Age-related osteoporosis without current pathological fracture: Secondary | ICD-10-CM | POA: Diagnosis not present

## 2017-09-21 DIAGNOSIS — I2510 Atherosclerotic heart disease of native coronary artery without angina pectoris: Secondary | ICD-10-CM | POA: Diagnosis not present

## 2017-09-21 DIAGNOSIS — R69 Illness, unspecified: Secondary | ICD-10-CM | POA: Diagnosis not present

## 2017-09-21 DIAGNOSIS — G2581 Restless legs syndrome: Secondary | ICD-10-CM | POA: Diagnosis not present

## 2017-09-21 DIAGNOSIS — I10 Essential (primary) hypertension: Secondary | ICD-10-CM | POA: Diagnosis not present

## 2017-09-21 DIAGNOSIS — S82001D Unspecified fracture of right patella, subsequent encounter for closed fracture with routine healing: Secondary | ICD-10-CM | POA: Diagnosis not present

## 2017-09-21 DIAGNOSIS — E782 Mixed hyperlipidemia: Secondary | ICD-10-CM | POA: Diagnosis not present

## 2017-09-21 DIAGNOSIS — K219 Gastro-esophageal reflux disease without esophagitis: Secondary | ICD-10-CM | POA: Diagnosis not present

## 2017-09-21 DIAGNOSIS — M1990 Unspecified osteoarthritis, unspecified site: Secondary | ICD-10-CM | POA: Diagnosis not present

## 2017-09-22 DIAGNOSIS — I10 Essential (primary) hypertension: Secondary | ICD-10-CM | POA: Diagnosis not present

## 2017-09-22 DIAGNOSIS — G2581 Restless legs syndrome: Secondary | ICD-10-CM | POA: Diagnosis not present

## 2017-09-22 DIAGNOSIS — S82001D Unspecified fracture of right patella, subsequent encounter for closed fracture with routine healing: Secondary | ICD-10-CM | POA: Diagnosis not present

## 2017-09-22 DIAGNOSIS — M810 Age-related osteoporosis without current pathological fracture: Secondary | ICD-10-CM | POA: Diagnosis not present

## 2017-09-22 DIAGNOSIS — K5790 Diverticulosis of intestine, part unspecified, without perforation or abscess without bleeding: Secondary | ICD-10-CM | POA: Diagnosis not present

## 2017-09-22 DIAGNOSIS — K219 Gastro-esophageal reflux disease without esophagitis: Secondary | ICD-10-CM | POA: Diagnosis not present

## 2017-09-22 DIAGNOSIS — R69 Illness, unspecified: Secondary | ICD-10-CM | POA: Diagnosis not present

## 2017-09-22 DIAGNOSIS — E782 Mixed hyperlipidemia: Secondary | ICD-10-CM | POA: Diagnosis not present

## 2017-09-22 DIAGNOSIS — M1990 Unspecified osteoarthritis, unspecified site: Secondary | ICD-10-CM | POA: Diagnosis not present

## 2017-09-22 DIAGNOSIS — I2510 Atherosclerotic heart disease of native coronary artery without angina pectoris: Secondary | ICD-10-CM | POA: Diagnosis not present

## 2017-09-26 DIAGNOSIS — I10 Essential (primary) hypertension: Secondary | ICD-10-CM | POA: Diagnosis not present

## 2017-09-26 DIAGNOSIS — M1990 Unspecified osteoarthritis, unspecified site: Secondary | ICD-10-CM | POA: Diagnosis not present

## 2017-09-26 DIAGNOSIS — M810 Age-related osteoporosis without current pathological fracture: Secondary | ICD-10-CM | POA: Diagnosis not present

## 2017-09-26 DIAGNOSIS — E782 Mixed hyperlipidemia: Secondary | ICD-10-CM | POA: Diagnosis not present

## 2017-09-26 DIAGNOSIS — R69 Illness, unspecified: Secondary | ICD-10-CM | POA: Diagnosis not present

## 2017-09-26 DIAGNOSIS — K219 Gastro-esophageal reflux disease without esophagitis: Secondary | ICD-10-CM | POA: Diagnosis not present

## 2017-09-26 DIAGNOSIS — G2581 Restless legs syndrome: Secondary | ICD-10-CM | POA: Diagnosis not present

## 2017-09-26 DIAGNOSIS — K5790 Diverticulosis of intestine, part unspecified, without perforation or abscess without bleeding: Secondary | ICD-10-CM | POA: Diagnosis not present

## 2017-09-26 DIAGNOSIS — I2510 Atherosclerotic heart disease of native coronary artery without angina pectoris: Secondary | ICD-10-CM | POA: Diagnosis not present

## 2017-09-26 DIAGNOSIS — S82001D Unspecified fracture of right patella, subsequent encounter for closed fracture with routine healing: Secondary | ICD-10-CM | POA: Diagnosis not present

## 2017-09-28 DIAGNOSIS — S82001D Unspecified fracture of right patella, subsequent encounter for closed fracture with routine healing: Secondary | ICD-10-CM | POA: Diagnosis not present

## 2017-09-28 DIAGNOSIS — K5790 Diverticulosis of intestine, part unspecified, without perforation or abscess without bleeding: Secondary | ICD-10-CM | POA: Diagnosis not present

## 2017-09-28 DIAGNOSIS — G2581 Restless legs syndrome: Secondary | ICD-10-CM | POA: Diagnosis not present

## 2017-09-28 DIAGNOSIS — I2510 Atherosclerotic heart disease of native coronary artery without angina pectoris: Secondary | ICD-10-CM | POA: Diagnosis not present

## 2017-09-28 DIAGNOSIS — R69 Illness, unspecified: Secondary | ICD-10-CM | POA: Diagnosis not present

## 2017-09-28 DIAGNOSIS — M1990 Unspecified osteoarthritis, unspecified site: Secondary | ICD-10-CM | POA: Diagnosis not present

## 2017-09-28 DIAGNOSIS — K219 Gastro-esophageal reflux disease without esophagitis: Secondary | ICD-10-CM | POA: Diagnosis not present

## 2017-09-28 DIAGNOSIS — E782 Mixed hyperlipidemia: Secondary | ICD-10-CM | POA: Diagnosis not present

## 2017-09-28 DIAGNOSIS — M810 Age-related osteoporosis without current pathological fracture: Secondary | ICD-10-CM | POA: Diagnosis not present

## 2017-09-28 DIAGNOSIS — I10 Essential (primary) hypertension: Secondary | ICD-10-CM | POA: Diagnosis not present

## 2017-09-29 DIAGNOSIS — K219 Gastro-esophageal reflux disease without esophagitis: Secondary | ICD-10-CM | POA: Diagnosis not present

## 2017-09-29 DIAGNOSIS — M810 Age-related osteoporosis without current pathological fracture: Secondary | ICD-10-CM | POA: Diagnosis not present

## 2017-09-29 DIAGNOSIS — M1990 Unspecified osteoarthritis, unspecified site: Secondary | ICD-10-CM | POA: Diagnosis not present

## 2017-09-29 DIAGNOSIS — I10 Essential (primary) hypertension: Secondary | ICD-10-CM | POA: Diagnosis not present

## 2017-09-29 DIAGNOSIS — S82001D Unspecified fracture of right patella, subsequent encounter for closed fracture with routine healing: Secondary | ICD-10-CM | POA: Diagnosis not present

## 2017-09-29 DIAGNOSIS — K5790 Diverticulosis of intestine, part unspecified, without perforation or abscess without bleeding: Secondary | ICD-10-CM | POA: Diagnosis not present

## 2017-09-29 DIAGNOSIS — R69 Illness, unspecified: Secondary | ICD-10-CM | POA: Diagnosis not present

## 2017-09-29 DIAGNOSIS — E782 Mixed hyperlipidemia: Secondary | ICD-10-CM | POA: Diagnosis not present

## 2017-09-29 DIAGNOSIS — G2581 Restless legs syndrome: Secondary | ICD-10-CM | POA: Diagnosis not present

## 2017-09-29 DIAGNOSIS — I2510 Atherosclerotic heart disease of native coronary artery without angina pectoris: Secondary | ICD-10-CM | POA: Diagnosis not present

## 2017-10-03 DIAGNOSIS — I10 Essential (primary) hypertension: Secondary | ICD-10-CM | POA: Diagnosis not present

## 2017-10-03 DIAGNOSIS — S82001D Unspecified fracture of right patella, subsequent encounter for closed fracture with routine healing: Secondary | ICD-10-CM | POA: Diagnosis not present

## 2017-10-03 DIAGNOSIS — M810 Age-related osteoporosis without current pathological fracture: Secondary | ICD-10-CM | POA: Diagnosis not present

## 2017-10-03 DIAGNOSIS — E782 Mixed hyperlipidemia: Secondary | ICD-10-CM | POA: Diagnosis not present

## 2017-10-03 DIAGNOSIS — R69 Illness, unspecified: Secondary | ICD-10-CM | POA: Diagnosis not present

## 2017-10-03 DIAGNOSIS — K219 Gastro-esophageal reflux disease without esophagitis: Secondary | ICD-10-CM | POA: Diagnosis not present

## 2017-10-03 DIAGNOSIS — G2581 Restless legs syndrome: Secondary | ICD-10-CM | POA: Diagnosis not present

## 2017-10-03 DIAGNOSIS — I2510 Atherosclerotic heart disease of native coronary artery without angina pectoris: Secondary | ICD-10-CM | POA: Diagnosis not present

## 2017-10-03 DIAGNOSIS — M1990 Unspecified osteoarthritis, unspecified site: Secondary | ICD-10-CM | POA: Diagnosis not present

## 2017-10-03 DIAGNOSIS — K5790 Diverticulosis of intestine, part unspecified, without perforation or abscess without bleeding: Secondary | ICD-10-CM | POA: Diagnosis not present

## 2017-10-04 DIAGNOSIS — I129 Hypertensive chronic kidney disease with stage 1 through stage 4 chronic kidney disease, or unspecified chronic kidney disease: Secondary | ICD-10-CM | POA: Diagnosis not present

## 2017-10-04 DIAGNOSIS — I2510 Atherosclerotic heart disease of native coronary artery without angina pectoris: Secondary | ICD-10-CM | POA: Diagnosis not present

## 2017-10-04 DIAGNOSIS — E782 Mixed hyperlipidemia: Secondary | ICD-10-CM | POA: Diagnosis not present

## 2017-10-04 DIAGNOSIS — N183 Chronic kidney disease, stage 3 (moderate): Secondary | ICD-10-CM | POA: Diagnosis not present

## 2017-10-04 DIAGNOSIS — L03114 Cellulitis of left upper limb: Secondary | ICD-10-CM | POA: Diagnosis not present

## 2017-10-04 DIAGNOSIS — I10 Essential (primary) hypertension: Secondary | ICD-10-CM | POA: Diagnosis not present

## 2017-10-06 DIAGNOSIS — G2581 Restless legs syndrome: Secondary | ICD-10-CM | POA: Diagnosis not present

## 2017-10-06 DIAGNOSIS — K219 Gastro-esophageal reflux disease without esophagitis: Secondary | ICD-10-CM | POA: Diagnosis not present

## 2017-10-06 DIAGNOSIS — S82001D Unspecified fracture of right patella, subsequent encounter for closed fracture with routine healing: Secondary | ICD-10-CM | POA: Diagnosis not present

## 2017-10-06 DIAGNOSIS — K5790 Diverticulosis of intestine, part unspecified, without perforation or abscess without bleeding: Secondary | ICD-10-CM | POA: Diagnosis not present

## 2017-10-06 DIAGNOSIS — R69 Illness, unspecified: Secondary | ICD-10-CM | POA: Diagnosis not present

## 2017-10-06 DIAGNOSIS — I10 Essential (primary) hypertension: Secondary | ICD-10-CM | POA: Diagnosis not present

## 2017-10-06 DIAGNOSIS — M810 Age-related osteoporosis without current pathological fracture: Secondary | ICD-10-CM | POA: Diagnosis not present

## 2017-10-06 DIAGNOSIS — E782 Mixed hyperlipidemia: Secondary | ICD-10-CM | POA: Diagnosis not present

## 2017-10-06 DIAGNOSIS — M1990 Unspecified osteoarthritis, unspecified site: Secondary | ICD-10-CM | POA: Diagnosis not present

## 2017-10-06 DIAGNOSIS — I2510 Atherosclerotic heart disease of native coronary artery without angina pectoris: Secondary | ICD-10-CM | POA: Diagnosis not present

## 2017-10-10 ENCOUNTER — Encounter (HOSPITAL_COMMUNITY): Payer: Self-pay | Admitting: Emergency Medicine

## 2017-10-10 ENCOUNTER — Emergency Department (HOSPITAL_COMMUNITY): Payer: Medicare HMO

## 2017-10-10 ENCOUNTER — Other Ambulatory Visit: Payer: Self-pay

## 2017-10-10 ENCOUNTER — Inpatient Hospital Stay (HOSPITAL_COMMUNITY)
Admission: EM | Admit: 2017-10-10 | Discharge: 2017-10-13 | DRG: 309 | Disposition: A | Discharge status: Home Health | Payer: Medicare HMO | Attending: Internal Medicine | Admitting: Internal Medicine

## 2017-10-10 DIAGNOSIS — Z9842 Cataract extraction status, left eye: Secondary | ICD-10-CM | POA: Diagnosis not present

## 2017-10-10 DIAGNOSIS — Z853 Personal history of malignant neoplasm of breast: Secondary | ICD-10-CM | POA: Diagnosis not present

## 2017-10-10 DIAGNOSIS — Z961 Presence of intraocular lens: Secondary | ICD-10-CM | POA: Diagnosis present

## 2017-10-10 DIAGNOSIS — Z955 Presence of coronary angioplasty implant and graft: Secondary | ICD-10-CM

## 2017-10-10 DIAGNOSIS — J45909 Unspecified asthma, uncomplicated: Secondary | ICD-10-CM | POA: Diagnosis present

## 2017-10-10 DIAGNOSIS — Z9011 Acquired absence of right breast and nipple: Secondary | ICD-10-CM | POA: Diagnosis not present

## 2017-10-10 DIAGNOSIS — Z885 Allergy status to narcotic agent status: Secondary | ICD-10-CM | POA: Diagnosis not present

## 2017-10-10 DIAGNOSIS — K5790 Diverticulosis of intestine, part unspecified, without perforation or abscess without bleeding: Secondary | ICD-10-CM | POA: Diagnosis not present

## 2017-10-10 DIAGNOSIS — G2581 Restless legs syndrome: Secondary | ICD-10-CM | POA: Diagnosis not present

## 2017-10-10 DIAGNOSIS — I509 Heart failure, unspecified: Secondary | ICD-10-CM | POA: Diagnosis present

## 2017-10-10 DIAGNOSIS — C9000 Multiple myeloma not having achieved remission: Secondary | ICD-10-CM | POA: Diagnosis present

## 2017-10-10 DIAGNOSIS — E782 Mixed hyperlipidemia: Secondary | ICD-10-CM | POA: Diagnosis not present

## 2017-10-10 DIAGNOSIS — K219 Gastro-esophageal reflux disease without esophagitis: Secondary | ICD-10-CM | POA: Diagnosis not present

## 2017-10-10 DIAGNOSIS — M1990 Unspecified osteoarthritis, unspecified site: Secondary | ICD-10-CM | POA: Diagnosis not present

## 2017-10-10 DIAGNOSIS — Z880 Allergy status to penicillin: Secondary | ICD-10-CM

## 2017-10-10 DIAGNOSIS — I10 Essential (primary) hypertension: Secondary | ICD-10-CM | POA: Diagnosis not present

## 2017-10-10 DIAGNOSIS — E1122 Type 2 diabetes mellitus with diabetic chronic kidney disease: Secondary | ICD-10-CM | POA: Diagnosis present

## 2017-10-10 DIAGNOSIS — R296 Repeated falls: Secondary | ICD-10-CM | POA: Diagnosis not present

## 2017-10-10 DIAGNOSIS — N183 Chronic kidney disease, stage 3 (moderate): Secondary | ICD-10-CM | POA: Diagnosis present

## 2017-10-10 DIAGNOSIS — I2510 Atherosclerotic heart disease of native coronary artery without angina pectoris: Secondary | ICD-10-CM | POA: Diagnosis present

## 2017-10-10 DIAGNOSIS — Z95828 Presence of other vascular implants and grafts: Secondary | ICD-10-CM | POA: Diagnosis not present

## 2017-10-10 DIAGNOSIS — M810 Age-related osteoporosis without current pathological fracture: Secondary | ICD-10-CM | POA: Diagnosis not present

## 2017-10-10 DIAGNOSIS — Z7902 Long term (current) use of antithrombotics/antiplatelets: Secondary | ICD-10-CM

## 2017-10-10 DIAGNOSIS — R0902 Hypoxemia: Secondary | ICD-10-CM | POA: Diagnosis present

## 2017-10-10 DIAGNOSIS — E785 Hyperlipidemia, unspecified: Secondary | ICD-10-CM | POA: Diagnosis not present

## 2017-10-10 DIAGNOSIS — R69 Illness, unspecified: Secondary | ICD-10-CM | POA: Diagnosis not present

## 2017-10-10 DIAGNOSIS — Z9841 Cataract extraction status, right eye: Secondary | ICD-10-CM | POA: Diagnosis not present

## 2017-10-10 DIAGNOSIS — I361 Nonrheumatic tricuspid (valve) insufficiency: Secondary | ICD-10-CM | POA: Diagnosis not present

## 2017-10-10 DIAGNOSIS — E875 Hyperkalemia: Secondary | ICD-10-CM

## 2017-10-10 DIAGNOSIS — I442 Atrioventricular block, complete: Secondary | ICD-10-CM | POA: Diagnosis present

## 2017-10-10 DIAGNOSIS — I272 Pulmonary hypertension, unspecified: Secondary | ICD-10-CM | POA: Diagnosis present

## 2017-10-10 DIAGNOSIS — S82001D Unspecified fracture of right patella, subsequent encounter for closed fracture with routine healing: Secondary | ICD-10-CM | POA: Diagnosis not present

## 2017-10-10 DIAGNOSIS — I130 Hypertensive heart and chronic kidney disease with heart failure and stage 1 through stage 4 chronic kidney disease, or unspecified chronic kidney disease: Secondary | ICD-10-CM | POA: Diagnosis present

## 2017-10-10 DIAGNOSIS — E1151 Type 2 diabetes mellitus with diabetic peripheral angiopathy without gangrene: Secondary | ICD-10-CM | POA: Diagnosis present

## 2017-10-10 DIAGNOSIS — R531 Weakness: Secondary | ICD-10-CM | POA: Diagnosis not present

## 2017-10-10 DIAGNOSIS — N179 Acute kidney failure, unspecified: Secondary | ICD-10-CM

## 2017-10-10 HISTORY — DX: Atrioventricular block, complete: I44.2

## 2017-10-10 LAB — TSH: TSH: 2.029 u[IU]/mL (ref 0.350–4.500)

## 2017-10-10 LAB — BASIC METABOLIC PANEL
ANION GAP: 11 (ref 5–15)
Anion gap: 11 (ref 5–15)
BUN: 38 mg/dL — AB (ref 8–23)
BUN: 35 mg/dL — AB (ref 8–23)
CALCIUM: 10.1 mg/dL (ref 8.9–10.3)
CALCIUM: 10.3 mg/dL (ref 8.9–10.3)
CO2: 24 mmol/L (ref 22–32)
CO2: 25 mmol/L (ref 22–32)
CREATININE: 2.15 mg/dL — AB (ref 0.44–1.00)
Chloride: 105 mmol/L (ref 98–111)
Chloride: 102 mmol/L (ref 98–111)
Creatinine, Ser: 2.27 mg/dL — ABNORMAL HIGH (ref 0.44–1.00)
GFR calc Af Amer: 22 mL/min — ABNORMAL LOW (ref >60)
GFR calc Af Amer: 21 mL/min — ABNORMAL LOW (ref >60)
GFR calc non Af Amer: 18 mL/min — ABNORMAL LOW (ref >60)
GFR, EST NON AFRICAN AMERICAN: 19 mL/min — AB (ref >60)
GLUCOSE: 116 mg/dL — AB (ref 70–99)
Glucose, Bld: 113 mg/dL — ABNORMAL HIGH (ref 70–99)
POTASSIUM: 6.4 mmol/L — AB (ref 3.5–5.1)
Potassium: 5.5 mmol/L — ABNORMAL HIGH (ref 3.5–5.1)
Sodium: 140 mmol/L (ref 135–145)
Sodium: 138 mmol/L (ref 135–145)

## 2017-10-10 LAB — I-STAT CHEM 8, ED
BUN: 34 mg/dL — ABNORMAL HIGH (ref 8–23)
CALCIUM ION: 1.22 mmol/L (ref 1.15–1.40)
CHLORIDE: 105 mmol/L (ref 98–111)
Creatinine, Ser: 2.2 mg/dL — ABNORMAL HIGH (ref 0.44–1.00)
GLUCOSE: 109 mg/dL — AB (ref 70–99)
HCT: 35 % — ABNORMAL LOW (ref 36.0–46.0)
Hemoglobin: 11.9 g/dL — ABNORMAL LOW (ref 12.0–15.0)
Potassium: 6.2 mmol/L — ABNORMAL HIGH (ref 3.5–5.1)
Sodium: 136 mmol/L (ref 135–145)
TCO2: 22 mmol/L (ref 22–32)

## 2017-10-10 LAB — MRSA PCR SCREENING: MRSA by PCR: NEGATIVE

## 2017-10-10 LAB — CBC
HEMATOCRIT: 36.8 % (ref 36.0–46.0)
Hemoglobin: 11.7 g/dL — ABNORMAL LOW (ref 12.0–15.0)
MCH: 31 pg (ref 26.0–34.0)
MCHC: 31.8 g/dL (ref 30.0–36.0)
MCV: 97.6 fL (ref 78.0–100.0)
Platelets: 241 10*3/uL (ref 150–400)
RBC: 3.77 MIL/uL — ABNORMAL LOW (ref 3.87–5.11)
RDW: 14.8 % (ref 11.5–15.5)
WBC: 8.9 10*3/uL (ref 4.0–10.5)

## 2017-10-10 LAB — CK: Total CK: 58 U/L (ref 38–234)

## 2017-10-10 LAB — I-STAT TROPONIN, ED: Troponin i, poc: 0 ng/mL (ref 0.00–0.08)

## 2017-10-10 LAB — MAGNESIUM: MAGNESIUM: 2.3 mg/dL (ref 1.7–2.4)

## 2017-10-10 LAB — SODIUM, URINE, RANDOM: SODIUM UR: 129 mmol/L

## 2017-10-10 LAB — CREATININE, URINE, RANDOM: Creatinine, Urine: 18.57 mg/dL

## 2017-10-10 LAB — T4, FREE: Free T4: 0.67 ng/dL — ABNORMAL LOW (ref 0.82–1.77)

## 2017-10-10 MED ORDER — SODIUM CHLORIDE 0.9 % IV SOLN: 250.0000 mL | INTRAVENOUS | Status: DC | PRN

## 2017-10-10 MED ORDER — INSULIN ASPART 100 UNIT/ML ~~LOC~~ SOLN
5.0000 [IU] | Freq: Once | SUBCUTANEOUS | Status: AC
  Administered 2017-10-10: 5 [IU] via INTRAVENOUS

## 2017-10-10 MED ORDER — SODIUM CHLORIDE 0.9 % IV BOLUS
500.0000 mL | Freq: Once | INTRAVENOUS | Status: AC
  Administered 2017-10-10: 500 mL via INTRAVENOUS

## 2017-10-10 MED ORDER — FUROSEMIDE 10 MG/ML IJ SOLN
80.0000 mg | Freq: Once | INTRAMUSCULAR | Status: AC
  Administered 2017-10-10: 80 mg via INTRAVENOUS
  Filled 2017-10-10: qty 8

## 2017-10-10 MED ORDER — SODIUM BICARBONATE 8.4 % IV SOLN
INTRAVENOUS | Status: DC
  Administered 2017-10-10 – 2017-10-11 (×2): via INTRAVENOUS
  Filled 2017-10-10 (×3): qty 150

## 2017-10-10 MED ORDER — SODIUM POLYSTYRENE SULFONATE 15 GM/60ML PO SUSP
30.0000 g | Freq: Once | ORAL | Status: AC
  Administered 2017-10-10: 30 g via ORAL
  Filled 2017-10-10: qty 120

## 2017-10-10 MED ORDER — SODIUM CHLORIDE 0.9 % IV SOLN
1.0000 g | Freq: Once | INTRAVENOUS | Status: AC
  Administered 2017-10-10: 1 g via INTRAVENOUS
  Filled 2017-10-10: qty 10

## 2017-10-10 MED ORDER — INSULIN ASPART 100 UNIT/ML ~~LOC~~ SOLN
5.0000 [IU] | Freq: Once | SUBCUTANEOUS | Status: DC
  Filled 2017-10-10: qty 1

## 2017-10-10 MED ORDER — ATROPINE SULFATE 1 MG/10ML IJ SOSY
PREFILLED_SYRINGE | INTRAMUSCULAR | Status: AC
  Filled 2017-10-10: qty 10

## 2017-10-10 MED ORDER — HEPARIN SODIUM (PORCINE) 5000 UNIT/ML IJ SOLN
5000.0000 [IU] | Freq: Three times a day (TID) | INTRAMUSCULAR | Status: DC
  Administered 2017-10-11 – 2017-10-13 (×7): 5000 [IU] via SUBCUTANEOUS
  Filled 2017-10-10 (×7): qty 1

## 2017-10-10 MED ORDER — DEXTROSE 50 % IV SOLN
1.0000 | Freq: Once | INTRAVENOUS | Status: AC
  Administered 2017-10-10: 50 mL via INTRAVENOUS
  Filled 2017-10-10: qty 50

## 2017-10-10 MED ORDER — SODIUM CHLORIDE 0.9 % IV SOLN: Freq: Once | INTRAVENOUS | Status: DC

## 2017-10-10 MED ORDER — SODIUM CHLORIDE 0.9 % IV SOLN
2.0000 g | Freq: Once | INTRAVENOUS | Status: AC
  Administered 2017-10-10: 2 g via INTRAVENOUS
  Filled 2017-10-10: qty 20

## 2017-10-10 MED ORDER — ALBUTEROL SULFATE (2.5 MG/3ML) 0.083% IN NEBU
5.0000 mg | INHALATION_SOLUTION | Freq: Once | RESPIRATORY_TRACT | Status: AC
  Administered 2017-10-10: 5 mg via RESPIRATORY_TRACT
  Filled 2017-10-10: qty 6

## 2017-10-10 MED ORDER — FUROSEMIDE 10 MG/ML IJ SOLN: 40.0000 mg | Freq: Two times a day (BID) | INTRAMUSCULAR | Status: DC

## 2017-10-10 MED ORDER — SODIUM CHLORIDE 0.9 % IV SOLN
1.0000 g | INTRAVENOUS | Status: AC
  Administered 2017-10-10: 1 g via INTRAVENOUS
  Filled 2017-10-10 (×4): qty 10

## 2017-10-10 MED ORDER — ATROPINE SULFATE 0.4 MG/ML IJ SOLN
0.4000 mg | Freq: Once | INTRAMUSCULAR | Status: AC
  Administered 2017-10-10: 0.4 mg via INTRAVENOUS
  Filled 2017-10-10: qty 1

## 2017-10-10 NOTE — Consult Note (Addendum)
CARDIOLOGY CONSULT NOTE  Patient ID: Joyce Oliver MRN: [id number] DOB/AGE: 04-Jul-1924 84 y.o.  Admit date: 10/10/2017 Referring Physician  Duffy Bruce, MD (ED) Primary Physician:  Merrilee Seashore, MD Reason for Consultation  Complete heart block  HPI: Ms. Joyce Oliver is a pleasant Caucasian female with history of unstable angina pectoris and coronary artery disease and stenting in 1999, who is had a negative nuclear stress test in 2017, due to worsening dyspnea and leg edema had undergone right heart catheterization on 07/08/2015 which revealed pulmonary hypertension probably WHO II group.  She is presently being treated with Revatio.  Since then she has not had any leg edema and dyspnea has improved.  She had been doing well and her usual state of health for the past 3 to 4 weeks ago she has had 2 falls leading to cellulitis of both the upper extremities and was being treated by home health.  She was seen by her PCP 2 weeks ago and her daughter mentioned that patient has been profoundly bradycardic and blood pressure has been low and hence had to reduce the dose of metoprolol tartrate from 50 mg twice daily to once a day.  2 weeks ago the dose was reduced to 25 mg daily due to bradycardia.  As late as past weekend, daughter had taken patient out to a restaurant and kept noticing that the heart rate has been around 50 to 60 bpm.  However this morning home health care noticed her heart rate to be in 30 bpm, was recommended to go to the emergency room.  In the ED she was found to be in complete heart block with acute renal failure and also hyperkalemia.  I was consulted to manage heart block.  Patient otherwise asymptomatic and feels well.  She feels generally weak and has had lack of appetite lately.  No fever or chills.  Past Medical History:  Diagnosis Date  . Cancer of breast, female [facility]) 1973   rt mastectomy  . Hypertension      Past Surgical History:  Procedure Laterality Date  .  CARDIAC CATHETERIZATION N/A 07/08/2015   Procedure: Right Heart Cath;  Surgeon: Adrian Prows, MD;  Location: [facility];  Service: Cardiovascular;  Laterality: N/A;  . CORONARY ANGIOPLASTY    . EYE SURGERY     Cataract surgery, bilateral lens implant  . hip revison-right  Right   . HIP SURGERY Right   . KNEE ARTHROSCOPY Right   . MASTECTOMY Right   . PERIPHERAL VASCULAR CATHETERIZATION N/A 07/08/2015   Procedure: Upper Extremity Angiography/Subclavion;  Surgeon: Adrian Prows, MD;  Location: [place] [facility];  Service: Cardiovascular;  Laterality: N/A;  . PERIPHERAL VASCULAR CATHETERIZATION  07/08/2015   Procedure: Peripheral Vascular Intervention;  Surgeon: Adrian Prows, MD;  Location: [facility];  Service: Cardiovascular;;  rt axillary INNOVA 5X80     Family history: No family history of premature coronary artery disease or diabetes.  Family history is noncontributory.  Social History: Social History   Socioeconomic History  . Marital status: Single    Spouse name: Not on file  . Number of children: Not on file  . Years of education: Not on file  . Highest education level: Not on file  Occupational History  . Not on file  Social Needs  . Financial resource strain: Not on file  . Food insecurity:    Worry: Not on file    Inability: Not on file  . Transportation needs:  Medical: Not on file    Non-medical: Not on file  Tobacco Use  . Smoking status: Never Smoker  . Smokeless tobacco: Never Used  Substance and Sexual Activity  . Alcohol use: No  . Drug use: No  . Sexual activity: Not on file  Lifestyle  . Physical activity:    Days per week: Not on file    Minutes per session: Not on file  . Stress: Not on file  Relationships  . Social connections:    Talks on phone: Not on file    Gets together: Not on file    Attends religious service: Not on file    Active member of club or organization: Not on file    Attends meetings of clubs or organizations: Not on file     Relationship status: Not on file  . Intimate partner violence:    Fear of current or ex partner: Not on file    Emotionally abused: Not on file    Physically abused: Not on file    Forced sexual activity: Not on file  Other Topics Concern  . Not on file  Social History Narrative  . Not on file    Review of Systems  Constitutional: Positive for malaise/fatigue. Negative for chills, fever and weight loss.  HENT: Positive for hearing loss (wears aids). Negative for ear discharge, nosebleeds and tinnitus.   Eyes: Negative.   Respiratory: Negative.   Cardiovascular: Positive for leg swelling. Negative for chest pain, palpitations and claudication.  Gastrointestinal: Negative.   Genitourinary: Positive for urgency (chronic).  Musculoskeletal: Negative.   Skin: Positive for rash (skin breakdown both forearms after fall.).  Neurological: Positive for dizziness (no syncope). Negative for tingling, tremors and headaches.  Psychiatric/Behavioral: Negative.   All other systems reviewed and are negative.  Physical Exam: Blood pressure 102/90, pulse (!) 57, temperature 98.3 F (36.8 C), temperature source Oral, resp. rate 19, SpO2 97 %.  Physical Exam  Constitutional: She is oriented to person, place, and time and well-developed, well-nourished, and in no distress.  Eyes: Conjunctivae are normal.  Neck: No JVD present. No tracheal deviation present. No thyromegaly present.  Cardiovascular:  Bradycardic, regular rhythm, S2 increased intensity.  Soft early systolic murmur in the second left sternal border.  Pulmonary/Chest: Effort normal and breath sounds normal. No stridor.  Abdominal: Soft. Bowel sounds are normal.  Musculoskeletal: Normal range of motion. She exhibits edema.  Lymphadenopathy:    She has no cervical adenopathy.  Neurological: She is alert and oriented to person, place, and time.  Skin: Skin is warm and dry.  Psychiatric: Affect normal.   Labs:  Lab Results   Component Value Date   WBC 8.9 10/10/2017   HGB 11.9 (L) 10/10/2017   HCT 35.0 (L) 10/10/2017   MCV 97.6 10/10/2017   PLT 241 10/10/2017   Recent Labs  Lab 10/10/17 1407 10/10/17 1414  NA 140 136  K 6.4* 6.2*  CL 105 105  CO2 24  --   BUN 38* 34*  CREATININE 2.15* 2.20*  CALCIUM 10.1  --   GLUCOSE 113* 109*  Radiology: Dg Chest 2 View  Result Date: 10/10/2017 CLINICAL DATA:  Multiple falls, low heart rate. EXAM: CHEST - 2 VIEW COMPARISON:  December 16, 2014 FINDINGS: The heart size and mediastinal contours are within normal limits. The lungs are hyperinflated. There is questioned mass with associated scarring in the right lung apex. There is no focal pneumonia, pneumothorax, pulmonary edema or pleural effusion. The visualized skeletal  structures are stable. No definite rib fracture is not identified. IMPRESSION: No active cardiopulmonary disease. Question mass associated with scar in the right lung apex, further evaluation with chest CT is recommended. Hyperinflated lungs. Electronically Signed   By: Abelardo Diesel M.D.   On: 10/10/2017 14:26    Scheduled Meds: . albuterol  5 mg Nebulization Once   Continuous Infusions: . calcium gluconate 1 GM IVPB    . sodium chloride 500 mL (10/10/17 1452)   Scheduled Meds: . atropine      . heparin  5,000 Units Subcutaneous Q8H   Continuous Infusions: . sodium chloride    . sodium chloride    .  sodium bicarbonate  infusion 1000 mL 125 mL/hr at 10/10/17 1742   PRN Meds:.sodium chloride  PRN Meds:.  CARDIAC STUDIES:  07/08/2015: Right heart catheterization, arch aortogram, selective right subclavian arteriogram and right axillary artery angioplasty. 1. Mild pulmonary hypertension with preserved cardiac output and cardiac index by 4.37 and 2.91. PA pressure 37/15/mean 23 mmHg. Findings consistent with probable primary pulmonary hypertension, pulmonary capillary wedge was 11 mmHg. 2. Bovine aortic arch with origin of the left carotid  artery from the right carotid artery.  3. 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 Right subdural artery shows mild diffuse disease. 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 4. Successful PTA and stenting of the right axillary artery with implantation of a 5.0 x 18 mm Innova self-expanding stent to the right axillary artery, stenosis reduced from 100% to 0% (CTO).  EKG 10/10/2017: Complete heart block with junctional escape rhythm at the rate of 28 bpm.  No evidence of ischemia, tall hyperacute T waves noted suggestive of hyperkalemia.  Office ECHO: 04/13/2017: Normal LV size, septal flattening suggestive of right-sided pressure overload pattern.  Diastolic function could not be assessed.  EF calculated at 57%.  Mild MR, moderate to severe TR, PA pressure 45 mmHg.  ASSESSMENT AND PLAN:  1.  Complete heart block probably secondary to hyperkalemia from acute renal failure, stage IV.  Previously she has had mild stage IIIa's chronic renal insufficiency with creatinine around 1.06 and EGFR around 47 mL. Patient was on 50 mg twice daily of metoprolol and 2 weeks ago was reduced to 25 mg, last dose was taken 25 mg yesterday.  2.  Pulmonary hypertension presently on sildenafil 20 mg p.o. 3 times daily but has been taking only twice a day.  Overall has done well with no recurrence of leg edema and dyspnea has improved.  3.  Hypertension 4.  Coronary artery disease with remote angioplasty and stenting in 1999 5.  Peripheral arterial disease with right axillary artery stenosis status post stenting in May 2017  Recommendation: I will start the patient on furosemide 80 mg x 1 followed by 40 mg twice daily and follow-up on potassium levels, agree with giving Kayexalate.  Continue hydration.  There is no indication for permanent pacemaker implantation at this point.  Agree with holding and discontinuing metoprolol.  Reason for acute renal  insufficiency need to be evaluated further, may be related to dehydration.  I will continue to follow. BMP ordered by crit care q 4 hours which I agree. Adrian Prows, MD 10/10/2017, 2:57 PM [facility]. [place] Pager: [phone] Office: [phone] If no answer Cell [phone]

## 2017-10-10 NOTE — ED Notes (Signed)
ED TO INPATIENT HANDOFF REPORT  Name/Age/Gender Joyce Oliver 84 y.o. female  Code Status Advance Directive Documentation     Most Recent Value  Type of Advance Directive  Healthcare Power of Attorney, Living will  Pre-existing out of facility DNR order (yellow form or pink MOST form)  -  "MOST" Form in Place?  -      Home/SNF/Other Home  Chief Complaint low pulse rate  Level of Care/Admitting Diagnosis ED Disposition    ED Disposition Condition [place]: [place] [100100]  Level of Care: ICU [6]  Diagnosis: Complete heart block [facility]) [939030]  Admitting Physician: Sampson Goon [[id number]]  Attending Physician: Sampson Goon [[id number]]  Estimated length of stay: past midnight tomorrow  Certification:: I certify this patient will need inpatient services for at least 2 midnights  PT Class (Do Not Modify): Inpatient [101]  PT Acc Code (Do Not Modify): Private [1]       Medical History Past Medical History:  Diagnosis Date  . Cancer of breast, female [facility]) 1973   rt mastectomy  . Hypertension     Allergies Allergies  Allergen Reactions  . Penicillins Other (See Comments)    Has patient had a PCN reaction causing immediate rash, facial/tongue/throat swelling, SOB or lightheadedness with hypotension: Unknown Has patient had a PCN reaction causing severe rash involving mucus membranes or skin necrosis: No Has patient had a PCN reaction that required hospitalization No Has patient had a PCN reaction occurring within the last 10 years: No If all of the above answers are "NO", then may proceed with Cephalosporin use.    . Codeine Nausea And Vomiting  . Morphine And Related Nausea And Vomiting    IV Location/Drains/Wounds Patient Lines/Drains/Airways Status   Active Line/Drains/Airways    Name:   Placement date:   Placement time:   Site:   Days:   Peripheral IV 10/10/17 Right Antecubital   10/10/17    1406    Antecubital    less than 1          Labs/Imaging Results for orders placed or performed during the hospital encounter of 10/10/17 (from the past 48 hour(s))  Basic metabolic panel     Status: Abnormal   Collection Time: 10/10/17  2:07 PM  Result Value Ref Range   Sodium 140 135 - 145 mmol/L   Potassium 6.4 (HH) 3.5 - 5.1 mmol/L    Comment: CRITICAL RESULT CALLED TO, READ BACK BY AND VERIFIED WITH: TAI,M. RN '@[id number]'  ON 08.12.19 BY COHEN,K    Chloride 105 98 - 111 mmol/L   CO2 24 22 - 32 mmol/L   Glucose, Bld 113 (H) 70 - 99 mg/dL   BUN 38 (H) 8 - 23 mg/dL   Creatinine, Ser 2.15 (H) 0.44 - 1.00 mg/dL   Calcium 10.1 8.9 - 10.3 mg/dL   GFR calc non Af Amer 19 (L) >60 mL/min   GFR calc Af Amer 22 (L) >60 mL/min    Comment: (NOTE) The eGFR has been calculated using the CKD EPI equation. This calculation has not been validated in all clinical situations. eGFR's persistently <60 mL/min signify possible Chronic Kidney Disease.    Anion gap 11 5 - 15    Comment: Performed at [facility], [place] [address] [place], [place] [address]  CBC     Status: Abnormal   Collection Time: 10/10/17  2:07 PM  Result Value Ref Range   WBC 8.9  4.0 - 10.5 K/uL   RBC 3.77 (L) 3.87 - 5.11 MIL/uL   Hemoglobin 11.7 (L) 12.0 - 15.0 g/dL   HCT 36.8 36.0 - 46.0 %   MCV 97.6 78.0 - 100.0 fL   MCH 31.0 26.0 - 34.0 pg   MCHC 31.8 30.0 - 36.0 g/dL   RDW 14.8 11.5 - 15.5 %   Platelets 241 150 - 400 K/uL    Comment: Performed at [facility], [place] [address] [place], [place] [address]  Magnesium     Status: None   Collection Time: 10/10/17  2:07 PM  Result Value Ref Range   Magnesium 2.3 1.7 - 2.4 mg/dL    Comment: Performed at [facility], [place] [address] [place], [place] [address]  I-stat troponin, ED     Status: None   Collection Time: 10/10/17  2:12 PM  Result Value Ref Range   Troponin i, poc 0.00 0.00 - 0.08 ng/mL   Comment 3            Comment: Due to the  release kinetics of cTnI, a negative result within the first hours of the onset of symptoms does not rule out myocardial infarction with certainty. If myocardial infarction is still suspected, repeat the test at appropriate intervals.   I-Stat Chem 8, ED     Status: Abnormal   Collection Time: 10/10/17  2:14 PM  Result Value Ref Range   Sodium 136 135 - 145 mmol/L   Potassium 6.2 (H) 3.5 - 5.1 mmol/L   Chloride 105 98 - 111 mmol/L   BUN 34 (H) 8 - 23 mg/dL   Creatinine, Ser 2.20 (H) 0.44 - 1.00 mg/dL   Glucose, Bld 109 (H) 70 - 99 mg/dL   Calcium, Ion 1.22 1.15 - 1.40 mmol/L   TCO2 22 22 - 32 mmol/L   Hemoglobin 11.9 (L) 12.0 - 15.0 g/dL   HCT 35.0 (L) 36.0 - 46.0 %   Dg Chest 2 View  Result Date: 10/10/2017 CLINICAL DATA:  Multiple falls, low heart rate. EXAM: CHEST - 2 VIEW COMPARISON:  December 16, 2014 FINDINGS: The heart size and mediastinal contours are within normal limits. The lungs are hyperinflated. There is questioned mass with associated scarring in the right lung apex. There is no focal pneumonia, pneumothorax, pulmonary edema or pleural effusion. The visualized skeletal structures are stable. No definite rib fracture is not identified. IMPRESSION: No active cardiopulmonary disease. Question mass associated with scar in the right lung apex, further evaluation with chest CT is recommended. Hyperinflated lungs. Electronically Signed   By: Abelardo Diesel M.D.   On: 10/10/2017 14:26    Pending Labs Unresulted Labs (From admission, onward)    Start     Ordered   10/10/17 1516  Urinalysis, Routine w reflex microscopic  Once,   R     10/10/17 1515          Vitals/Pain Today's Vitals   10/10/17 1346 10/10/17 1347 10/10/17 1430 10/10/17 1501  BP: 102/90  115/70 (!) 170/144  Pulse: (!) 57  (!) 34 (!) 103  Resp: 19  20 (!) 29  Temp: 98.3 F (36.8 C)     TempSrc: Oral     SpO2: 97%  100% 100%  PainSc:  0-No pain      Isolation Precautions No active  isolations  Medications Medications  0.9 %  sodium chloride infusion (has no administration in time range)  calcium gluconate 1 g in sodium chloride 0.9 %  100 mL IVPB (has no administration in time range)  calcium gluconate 1 g in sodium chloride 0.9 % 100 mL IVPB (0 g Intravenous Stopped 10/10/17 1548)  dextrose 50 % solution 50 mL (50 mLs Intravenous Given 10/10/17 1454)  sodium chloride 0.9 % bolus 500 mL (500 mLs Intravenous New Bag/Given 10/10/17 1452)  albuterol (PROVENTIL) (2.5 MG/3ML) 0.083% nebulizer solution 5 mg (5 mg Nebulization Given 10/10/17 1552)  insulin aspart (novoLOG) injection 5 Units (5 Units Intravenous Given 10/10/17 1455)  atropine injection 0.4 mg (0.4 mg Intravenous Given 10/10/17 1541)    Mobility walks with person assist

## 2017-10-10 NOTE — Consult Note (Signed)
PULMONARY / CRITICAL CARE MEDICINE   Name: Joyce Oliver MRN: [id number] DOB: 27-Feb-1925    ADMISSION DATE:  10/10/2017 CONSULTATION DATE: 10/10/2017   CHIEF COMPLAINT: weakness  HISTORY OF PRESENT ILLNESS:        This is a 84 year old who presented to the [facility] emergency room complaining of generalized weakness.  She was found to be in third-degree AV block and hyperkalemic and at the request of cardiology she was transferred to this hospital.  Pertinent to the third-degree AV block she is not recently experienced any chest pain she is not in experienced an increase in dyspnea or any increase in lower extremity edema.  She believes someone changed 1 of her medicines a month ago but is not aware of what that medicine was.  She is receiving a beta-blocker.  Pertinent to the hyperkalemia again she suspects that 1 of her medicines was changed a month ago.  Her MAR does not list any ARB's or ACE inhibitors.  She has not had any prolonged immobilization or muscle pains.  PAST MEDICAL HISTORY :  She  has a past medical history of Cancer of breast, female ([place]) (1973) and Hypertension.  PAST SURGICAL HISTORY: She  has a past surgical history that includes Mastectomy (Right); Hip surgery (Right); Knee arthroscopy (Right); Coronary angioplasty; Eye surgery; hip revison-right  (Right); Cardiac catheterization (N/A, 07/08/2015); Cardiac catheterization (N/A, 07/08/2015); and Cardiac catheterization (07/08/2015).  Allergies  Allergen Reactions  . Penicillins Other (See Comments)    Has patient had a PCN reaction causing immediate rash, facial/tongue/throat swelling, SOB or lightheadedness with hypotension: Unknown Has patient had a PCN reaction causing severe rash involving mucus membranes or skin necrosis: No Has patient had a PCN reaction that required hospitalization No Has patient had a PCN reaction occurring within the last 10 years: No If all of the above answers are "NO", then may proceed with  Cephalosporin use.    . Codeine Nausea And Vomiting  . Morphine And Related Nausea And Vomiting    No current facility-administered medications on file prior to encounter.    Current Outpatient Medications on File Prior to Encounter  Medication Sig  . acetaminophen (TYLENOL) 500 MG tablet Take 1,000 mg by mouth daily as needed for mild pain.   Marland Kitchen albuterol (PROVENTIL HFA;VENTOLIN HFA) 108 (90 Base) MCG/ACT inhaler Inhale 2 puffs into the lungs every 4 (four) hours as needed for wheezing or shortness of breath.  Marland Kitchen amLODipine (NORVASC) 5 MG tablet Take 5 mg by mouth daily.  Marland Kitchen aspirin 81 MG tablet Take 1 tablet (81 mg total) by mouth daily.  . calcium-vitamin D (OSCAL WITH D) 500-200 MG-UNIT tablet Take 1 tablet by mouth daily with breakfast.  . clopidogrel (PLAVIX) 75 MG tablet Take 1 tablet (75 mg total) by mouth daily.  Marland Kitchen lovastatin (MEVACOR) 40 MG tablet Take 40 mg by mouth at bedtime.  . metoprolol (LOPRESSOR) 50 MG tablet Take 50 mg by mouth 2 (two) times daily.   . Multiple Vitamins-Minerals (CENTRUM SILVER ADULT 50+ PO) Take 1 tablet by mouth daily.  . pantoprazole (PROTONIX) 40 MG tablet Take 40 mg by mouth daily.  . ranitidine (ZANTAC) 150 MG capsule Take 1 capsule (150 mg total) by mouth 2 (two) times daily between meals as needed for heartburn.  . tolterodine (DETROL LA) 4 MG 24 hr capsule Take 4 mg by mouth daily.  . vitamin B-12 (CYANOCOBALAMIN) 500 MCG tablet Take 500 mcg by mouth daily.    FAMILY HISTORY:  Her family history is not on file.  SOCIAL HISTORY: She  reports that she has never smoked. She has never used smokeless tobacco. She reports that she does not drink alcohol or use drugs.  REVIEW OF SYSTEMS:   10 system review of systems reveals the following: She normally is fairly independent but says that she does not have enough strength to get upstairs at this point.  She is able to get herself in and out of a chair but if she finds herself on the floor she cannot  get back up.  She denies a prior history of stroke or seizure, she says that her memory is failing her.  She has no known history of psychiatric disorders.  She is not aware that she carries any respiratory diagnoses, she specifically denies unusual dyspnea wheezing or noisy breathing or hemoptysis.  She has not had any recent chest pain as noted.  She denies a history of GI bleeding, abdominal pathology, or hepatitis.  She has no known history of thyroid disease and no history of diabetes.  She believes her hearing is failing and she does require glasses to read.  SUBJECTIVE:  As above  VITAL SIGNS: BP (!) 170/144   Pulse (!) 103   Temp 98.3 F (36.8 C) (Oral)   Resp (!) 29   SpO2 100%   HEMODYNAMICS:    VENTILATOR SETTINGS:    INTAKE / OUTPUT: No intake/output data recorded.  PHYSICAL EXAMINATION: General: Thin elderly female who is very alert and appropriately interactive and in no distress Neuro: He is oriented x3.  Pupils are equal and the face is symmetric.  She has a 5 out of 5 grip bilaterally and is able to lift both legs off the gurney. Cardiovascular: S1 and S2 are slow and regular without murmur rub or gallop Lungs: Respirations are unlabored, there is symmetric air movement, no wheezes. Abdomen: The abdomen is soft without any organomegaly masses or tenderness Musculoskeletal: She has trace dependent edema Skin:    LABS:  BMET Recent Labs  Lab 10/10/17 1407 10/10/17 1414  NA 140 136  K 6.4* 6.2*  CL 105 105  CO2 24  --   BUN 38* 34*  CREATININE 2.15* 2.20*  GLUCOSE 113* 109*    Electrolytes Recent Labs  Lab 10/10/17 1407  CALCIUM 10.1  MG 2.3    CBC Recent Labs  Lab 10/10/17 1407 10/10/17 1414  WBC 8.9  --   HGB 11.7* 11.9*  HCT 36.8 35.0*  PLT 241  --     Coag's No results for input(s): APTT, INR in the last 168 hours.  Sepsis Markers No results for input(s): LATICACIDVEN, PROCALCITON, O2SATVEN in the last 168 hours.  ABG No  results for input(s): PHART, PCO2ART, PO2ART in the last 168 hours.  Liver Enzymes No results for input(s): AST, ALT, ALKPHOS, BILITOT, ALBUMIN in the last 168 hours.  Cardiac Enzymes No results for input(s): TROPONINI, PROBNP in the last 168 hours.  Glucose No results for input(s): GLUCAP in the last 168 hours.  Imaging Dg Chest 2 View  Result Date: 10/10/2017 CLINICAL DATA:  Multiple falls, low heart rate. EXAM: CHEST - 2 VIEW COMPARISON:  December 16, 2014 FINDINGS: The heart size and mediastinal contours are within normal limits. The lungs are hyperinflated. There is questioned mass with associated scarring in the right lung apex. There is no focal pneumonia, pneumothorax, pulmonary edema or pleural effusion. The visualized skeletal structures are stable. No definite rib fracture is not identified.  IMPRESSION: No active cardiopulmonary disease. Question mass associated with scar in the right lung apex, further evaluation with chest CT is recommended. Hyperinflated lungs. Electronically Signed   By: Abelardo Diesel M.D.   On: 10/10/2017 14:26     STUDIES:  EKG shows third-degree AV block, low voltage and T waves that are of higher voltage in her QRS complexes  CULTURES: None  ANTIBIOTICS: None  SIGNIFICANT EVENTS:   LINES/TUBES: None  DISCUSSION: This is a 84 year old who presented with weakness and is found to be in third-degree AV block and hyperkalemic  ASSESSMENT / PLAN:  PULMONARY A: No issues     CARDIOVASCULAR A: Third-degree AV block however she does appear to be adequately perfusing at least as judged by mentation.  Pacing pads are in place, I am obviously holding her beta-blocker and correcting her hyperkalemia.  She does have diffuse low voltage, the rate is sufficiently slow and regular that I do not think I could judge if she had any paradox and I have ordered an echocardiogram to rule out concurrent pericardial disease.  RENAL A: Hyperkalemia.  No obvious  pharmacologic provocation.  Treating with bicarbonate and Kayexalate, if she does not respond we will need to consult nephrology for potential dialysis.  CPK has been ordered to rule out rhabdomyolysis as a provocation for her weakness and hyperkalemia.  GASTROINTESTINAL A: No active issues   HEMATOLOGIC A: No active issues  INFECTIOUS A: No active issues     ENDOCRINE A: No known history of thyroid disease however TSH and free T4 have been ordered.   NEUROLOGIC A: No active issues   Greater than 32 minutes has been spent in the care of this patient today who has potentially life-threatening arrhythmias and hyperkalemia   Lars Masson, MD [place] Pager: [phone]  10/10/2017, 5:21 PM
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
## 2017-10-10 NOTE — ED Notes (Addendum)
CRITICAL VALUE STICKER  CRITICAL VALUE: K of 6.4  RECEIVER (on-site recipient of call): Malea Swilling, [place] NOTIFIED: 10/10/17 @ 14:47  MESSENGER (representative from lab):   MD NOTIFIED: Dr. Myrene Buddy  TIME OF NOTIFICATION: 14:49  RESPONSE: Waiting for response.

## 2017-10-10 NOTE — ED Triage Notes (Signed)
Pt home health nurse came out today to evaluate patient's where she has fallen couple times and on antibiotics, pt HR was 37 so advised to go to ED.

## 2017-10-11 ENCOUNTER — Inpatient Hospital Stay (HOSPITAL_COMMUNITY): Payer: Medicare HMO

## 2017-10-11 DIAGNOSIS — R531 Weakness: Secondary | ICD-10-CM

## 2017-10-11 DIAGNOSIS — I361 Nonrheumatic tricuspid (valve) insufficiency: Secondary | ICD-10-CM

## 2017-10-11 DIAGNOSIS — I442 Atrioventricular block, complete: Principal | ICD-10-CM

## 2017-10-11 DIAGNOSIS — N179 Acute kidney failure, unspecified: Secondary | ICD-10-CM

## 2017-10-11 LAB — BASIC METABOLIC PANEL
ANION GAP: 13 (ref 5–15)
ANION GAP: 12 (ref 5–15)
ANION GAP: 12 (ref 5–15)
ANION GAP: 12 (ref 5–15)
BUN: 33 mg/dL — ABNORMAL HIGH (ref 8–23)
BUN: 29 mg/dL — ABNORMAL HIGH (ref 8–23)
BUN: 29 mg/dL — ABNORMAL HIGH (ref 8–23)
BUN: 26 mg/dL — ABNORMAL HIGH (ref 8–23)
CALCIUM: 9.6 mg/dL (ref 8.9–10.3)
CALCIUM: 9.2 mg/dL (ref 8.9–10.3)
CHLORIDE: 94 mmol/L — AB (ref 98–111)
CHLORIDE: 100 mmol/L (ref 98–111)
CHLORIDE: 96 mmol/L — AB (ref 98–111)
CHLORIDE: 92 mmol/L — AB (ref 98–111)
CO2: 33 mmol/L — AB (ref 22–32)
CO2: 29 mmol/L (ref 22–32)
CO2: 35 mmol/L — AB (ref 22–32)
CO2: 37 mmol/L — AB (ref 22–32)
CREATININE: 1.97 mg/dL — AB (ref 0.44–1.00)
Calcium: 10.5 mg/dL — ABNORMAL HIGH (ref 8.9–10.3)
Calcium: 10.1 mg/dL (ref 8.9–10.3)
Creatinine, Ser: 2.09 mg/dL — ABNORMAL HIGH (ref 0.44–1.00)
Creatinine, Ser: 2.16 mg/dL — ABNORMAL HIGH (ref 0.44–1.00)
Creatinine, Ser: 2.09 mg/dL — ABNORMAL HIGH (ref 0.44–1.00)
GFR calc non Af Amer: 20 mL/min — ABNORMAL LOW (ref >60)
GFR calc non Af Amer: 19 mL/min — ABNORMAL LOW (ref >60)
GFR calc non Af Amer: 20 mL/min — ABNORMAL LOW (ref >60)
GFR calc non Af Amer: 21 mL/min — ABNORMAL LOW (ref >60)
GFR, EST AFRICAN AMERICAN: 24 mL/min — AB (ref >60)
GFR, EST AFRICAN AMERICAN: 22 mL/min — AB (ref >60)
GFR, EST AFRICAN AMERICAN: 23 mL/min — AB (ref >60)
GFR, EST AFRICAN AMERICAN: 23 mL/min — AB (ref >60)
Glucose, Bld: 105 mg/dL — ABNORMAL HIGH (ref 70–99)
Glucose, Bld: 120 mg/dL — ABNORMAL HIGH (ref 70–99)
Glucose, Bld: 101 mg/dL — ABNORMAL HIGH (ref 70–99)
Glucose, Bld: 119 mg/dL — ABNORMAL HIGH (ref 70–99)
POTASSIUM: 4.4 mmol/L (ref 3.5–5.1)
Potassium: 3.8 mmol/L (ref 3.5–5.1)
Potassium: 3.7 mmol/L (ref 3.5–5.1)
Potassium: 3.6 mmol/L (ref 3.5–5.1)
SODIUM: 143 mmol/L (ref 135–145)
SODIUM: 141 mmol/L (ref 135–145)
SODIUM: 140 mmol/L (ref 135–145)
Sodium: 141 mmol/L (ref 135–145)

## 2017-10-11 LAB — ECHOCARDIOGRAM COMPLETE
HEIGHTINCHES: 66 in
Weight: 1601.42 oz

## 2017-10-11 MED ORDER — PERFLUTREN LIPID MICROSPHERE
INTRAVENOUS | Status: AC
  Administered 2017-10-11: 3 mL via INTRAVENOUS
  Filled 2017-10-11: qty 10

## 2017-10-11 MED ORDER — PERFLUTREN LIPID MICROSPHERE
1.0000 mL | INTRAVENOUS | Status: AC | PRN
  Administered 2017-10-11: 3 mL via INTRAVENOUS
  Filled 2017-10-11: qty 10

## 2017-10-11 MED ORDER — SODIUM CHLORIDE 0.9 % IV SOLN
INTRAVENOUS | Status: DC
  Administered 2017-10-11 – 2017-10-12 (×2): via INTRAVENOUS

## 2017-10-11 NOTE — Progress Notes (Signed)
LUE (Forearm) with possible Full Thickness or Deep Tissue Injury s/p fall at home 6 weeks ago per Niece.  Prior to admission per Niece [facility] was dressing the site. Sites were cleansed with NS and dressed with vaseline gauze and wrapped with Kerlix this evening. Sites were measured at 7cm x 3 cm and 4cm x 2 cm. Niece concerned about the sites.  Informed her of the consult for WOC.

## 2017-10-11 NOTE — Progress Notes (Signed)
  Echocardiogram 2D Echocardiogram with definity has been performed.  Darlina Sicilian M 10/11/2017, 2:09 PM

## 2017-10-11 NOTE — Progress Notes (Signed)
PULMONARY / CRITICAL CARE MEDICINE   Name: Joyce Oliver MRN: [id number] DOB: May 13, 1924    ADMISSION DATE:  10/10/2017 CONSULTATION DATE: 10/10/2017   CHIEF COMPLAINT: weakness  HISTORY OF PRESENT ILLNESS:        This is a 84 year old who presented to the [facility] emergency room complaining of generalized weakness.  She was found to be in third-degree AV block and hyperkalemic and at the request of cardiology she was transferred to this hospital.  Pertinent to the third-degree AV block she is not recently experienced any chest pain she is not in experienced an increase in dyspnea or any increase in lower extremity edema.  She believes someone changed 1 of her medicines a month ago but is not aware of what that medicine was.  She is receiving a beta-blocker.  Pertinent to the hyperkalemia again she suspects that 1 of her medicines was changed a month ago.  Her MAR does not list any ARB's or ACE inhibitors.  She has not had any prolonged immobilization or muscle pains.  SUBJECTIVE:  No events overnight  VITAL SIGNS: BP (!) 113/59   Pulse 62   Temp 97.8 F (36.6 C) (Oral)   Resp 17   Ht 5\' 6"  (1.676 m)   Wt 45.4 kg   SpO2 98%   BMI 16.15 kg/m   HEMODYNAMICS:    VENTILATOR SETTINGS:    INTAKE / OUTPUT: I/O last 3 completed shifts: In: 2303.8 [I.V.:1470.1; IV Piggyback:833.7] Out: 1225 [Urine:1225]  PHYSICAL EXAMINATION: General: Thin elderly female, resting in exam bed, NAD Neuro: Alert and interactive, moving all ext to command Cardiovascular: RRR, Nl S1/S2 and -M/R/G Lungs: Decreased BS at the bases, otherwise clear Abdomen: Soft, NT, ND and +BS Musculoskeletal: minimal edema Skin:  Intact but thin  LABS:  BMET Recent Labs  Lab 10/11/17 0010 10/11/17 0339 10/11/17 0749  NA 141 143 141  K 4.4 3.8 3.7  CL 100 96* 92*  CO2 29 35* 37*  BUN 33* 29* 29*  CREATININE 2.09* 2.16* 2.09*  GLUCOSE 105* 120* 101*    Electrolytes Recent Labs  Lab 10/10/17 1407   10/11/17 0010 10/11/17 0339 10/11/17 0749  CALCIUM 10.1   < > 10.5* 10.1 9.6  MG 2.3  --   --   --   --    < > = values in this interval not displayed.    CBC Recent Labs  Lab 10/10/17 1407 10/10/17 1414  WBC 8.9  --   HGB 11.7* 11.9*  HCT 36.8 35.0*  PLT 241  --     Coag's No results for input(s): APTT, INR in the last 168 hours.  Sepsis Markers No results for input(s): LATICACIDVEN, PROCALCITON, O2SATVEN in the last 168 hours.  ABG No results for input(s): PHART, PCO2ART, PO2ART in the last 168 hours.  Liver Enzymes No results for input(s): AST, ALT, ALKPHOS, BILITOT, ALBUMIN in the last 168 hours.  Cardiac Enzymes No results for input(s): TROPONINI, PROBNP in the last 168 hours.  Glucose No results for input(s): GLUCAP in the last 168 hours.  Imaging Dg Chest 2 View  Result Date: 10/10/2017 CLINICAL DATA:  Multiple falls, low heart rate. EXAM: CHEST - 2 VIEW COMPARISON:  December 16, 2014 FINDINGS: The heart size and mediastinal contours are within normal limits. The lungs are hyperinflated. There is questioned mass with associated scarring in the right lung apex. There is no focal pneumonia, pneumothorax, pulmonary edema or pleural effusion. The visualized skeletal structures are stable. No  definite rib fracture is not identified. IMPRESSION: No active cardiopulmonary disease. Question mass associated with scar in the right lung apex, further evaluation with chest CT is recommended. Hyperinflated lungs. Electronically Signed   By: Abelardo Diesel M.D.   On: 10/10/2017 14:26     STUDIES:  EKG shows third-degree AV block, low voltage and T waves that are of higher voltage in her QRS complexes  CULTURES: None  ANTIBIOTICS: None  SIGNIFICANT EVENTS:   LINES/TUBES: None  I reviewed CXR myself, no acute disease noted  DISCUSSION: This is a 84 year old who presented with weakness and is found to be in third-degree AV block and hyperkalemic.  Resolved with  improvement of hyperkalemia.  Discussed with TRH-MD and PCCM-NP.  ASSESSMENT / PLAN:  PULMONARY A: Mild hypoxemia  - Titrate O2 for sat of 88-92% - Hopefully can get off O2 today, not on O2 at home  CARDIOVASCULAR A: Third degree AV block likely from hyperkalemia due to renal failure, resolved - Tele monitoring - Cards following - D/C BB - Continue to monitor K  RENAL A: Hyperkalemia Acute renal failure - D/C bicarb - Hydrate with NS at 50 ml/hr - D/C lasix - BMET at 7 PM tonight and in AM - Renal U/S  GASTROINTESTINAL A: No active issues - Start heart health diet  HEMATOLOGIC A: No active issues  INFECTIOUS A: No active issues    ENDOCRINE A: No known history of thyroid disease however TSH and free T4 have been ordered and noted  NEUROLOGIC A: No active issues  Will transfer to SDU and to [facility] service with PCCM off 8/14.  Rush Farmer, M.D. [facility] Pulmonary/Critical Care Medicine. Pager: [phone]. After hours pager: [phone].  10/11/2017, 9:17 AM

## 2017-10-11 NOTE — H&P (Signed)
This was entered as a consult note by me on 8/12

## 2017-10-12 LAB — BASIC METABOLIC PANEL
Anion gap: 11 (ref 5–15)
BUN: 26 mg/dL — ABNORMAL HIGH (ref 8–23)
CHLORIDE: 99 mmol/L (ref 98–111)
CO2: 31 mmol/L (ref 22–32)
Calcium: 8.9 mg/dL (ref 8.9–10.3)
Creatinine, Ser: 1.79 mg/dL — ABNORMAL HIGH (ref 0.44–1.00)
GFR calc non Af Amer: 24 mL/min — ABNORMAL LOW (ref >60)
GFR, EST AFRICAN AMERICAN: 27 mL/min — AB (ref >60)
Glucose, Bld: 120 mg/dL — ABNORMAL HIGH (ref 70–99)
Potassium: 3.7 mmol/L (ref 3.5–5.1)
Sodium: 141 mmol/L (ref 135–145)

## 2017-10-12 LAB — CBC
HEMATOCRIT: 33.6 % — AB (ref 36.0–46.0)
HEMOGLOBIN: 10.5 g/dL — AB (ref 12.0–15.0)
MCH: 31.3 pg (ref 26.0–34.0)
MCHC: 31.3 g/dL (ref 30.0–36.0)
MCV: 100.3 fL — ABNORMAL HIGH (ref 78.0–100.0)
Platelets: 153 10*3/uL (ref 150–400)
RBC: 3.35 MIL/uL — ABNORMAL LOW (ref 3.87–5.11)
RDW: 14.1 % (ref 11.5–15.5)
WBC: 7 10*3/uL (ref 4.0–10.5)

## 2017-10-12 LAB — PHOSPHORUS: Phosphorus: 3.8 mg/dL (ref 2.5–4.6)

## 2017-10-12 LAB — MAGNESIUM: MAGNESIUM: 1.6 mg/dL — AB (ref 1.7–2.4)

## 2017-10-12 MED ORDER — ENSURE ENLIVE PO LIQD
237.0000 mL | Freq: Two times a day (BID) | ORAL | Status: DC
  Administered 2017-10-12 – 2017-10-13 (×2): 237 mL via ORAL

## 2017-10-12 MED ORDER — ONDANSETRON HCL 4 MG/2ML IJ SOLN
4.0000 mg | Freq: Four times a day (QID) | INTRAMUSCULAR | Status: DC | PRN
  Administered 2017-10-12: 4 mg via INTRAVENOUS
  Filled 2017-10-12: qty 2

## 2017-10-12 NOTE — Evaluation (Signed)
Physical Therapy Evaluation and Discharge Patient Details Name: Joyce Oliver MRN: [id number] DOB: [birth year] Today's Date: 10/12/2017   History of Present Illness  This is a 84 year old who presented with weakness and is found to be in third-degree AV block and hyperkalemic. PMH includes not but limited to: R knee and hip surgery, Mastectomy.     Clinical Impression  Patient evaluated by Physical Therapy with no further acute PT needs identified. All education has been completed and the patient has no further questions. PTA, pt living home with caregiver support for ADLs all hours of the day, with avaiability for nighttime as well per caregiver. Today patient ambulating hallway with RW, min guard for safety, mild balance deficits, SpO2 WNL on RA with activity, no DOE or overt LOB although feel patient is at increased risk of falls and advised caregiver to guard with OOB mobility.See below for any follow-up Physical Therapy or equipment needs. PT is signing off. Thank you for this referral.     Follow Up Recommendations Home health PT;Supervision for mobility/OOB    Equipment Recommendations  (all needs currently met)    Recommendations for Other Services       Precautions / Restrictions Precautions Precautions: Fall Restrictions Weight Bearing Restrictions: No      Mobility  Bed Mobility               General bed mobility comments: OOB in chair at entry  Transfers Overall transfer level: Needs assistance Equipment used: Rolling walker (2 wheeled) Transfers: Sit to/from Stand Sit to Stand: Min guard         General transfer comment: min guard for safety  Ambulation/Gait Ambulation/Gait assistance: Min guard Gait Distance (Feet): 100 Feet Assistive device: Rolling walker (2 wheeled) Gait Pattern/deviations: Step-to pattern;Step-through pattern Gait velocity: decreased   General Gait Details: pt ambulating with min guard, RW, mild difficulty with obstacle  avoidence and path deviation walking. unsafe to ambulate independently with hx of falls as well. SpO2 WNL on RA, HR max 100. no DOE or overt LOB  Financial trader [employer] (Stroke Patients Only)       Balance                                             Pertinent Vitals/Pain Pain Assessment: No/denies pain    Home Living Family/patient expects to be discharged to:: Private residence Living Arrangements: Alone Available Help at Discharge: Available 24 hours/day Type of Home: House Home Access: Level entry     Home Layout: One level Home Equipment: Environmental consultant - 4 wheels;Walker - 2 wheels;Shower seat;Hand held shower head      Prior Function Level of Independence: Needs assistance   Gait / Transfers Assistance Needed: household ambulation with RW  ADL's / Homemaking Assistance Needed: caregivers with ADLs        Hand Dominance        Extremity/Trunk Assessment   Upper Extremity Assessment Upper Extremity Assessment: Overall WFL for tasks assessed    Lower Extremity Assessment Lower Extremity Assessment: Overall WFL for tasks assessed       Communication      Cognition Arousal/Alertness: Awake/alert Behavior During Therapy: WFL for tasks assessed/performed Overall Cognitive Status: Within Functional Limits for tasks assessed  General Comments General comments (skin integrity, edema, etc.): pts caregiver present, discussed proper use of gait belt    Exercises     Assessment/Plan    PT Assessment All further PT needs can be met in the next venue of care  PT Problem List Cardiopulmonary status limiting activity;Decreased strength;Decreased activity tolerance;Decreased balance;Decreased mobility       PT Treatment Interventions DME instruction;Gait training;Functional mobility training;Therapeutic activities;Balance training;Therapeutic exercise     PT Goals (Current goals can be found in the Care Plan section)  Acute Rehab PT Goals Patient Stated Goal: go home when ready PT Goal Formulation: With patient Time For Goal Achievement: 10/19/17 Potential to Achieve Goals: Good    Frequency     Barriers to discharge        Co-evaluation               AM-PAC PT "6 Clicks" Daily Activity  Outcome Measure Difficulty turning over in bed (including adjusting bedclothes, sheets and blankets)?: None Difficulty moving from lying on back to sitting on the side of the bed? : None Difficulty sitting down on and standing up from a chair with arms (e.g., wheelchair, bedside commode, etc,.)?: A Little Help needed moving to and from a bed to chair (including a wheelchair)?: A Little Help needed walking in hospital room?: A Little Help needed climbing 3-5 steps with a railing? : A Little 6 Click Score: 20    End of Session Equipment Utilized During Treatment: Gait belt Activity Tolerance: Patient tolerated treatment well Patient left: in chair;with call bell/phone within reach Nurse Communication: Mobility status PT Visit Diagnosis: Unsteadiness on feet (R26.81);Muscle weakness (generalized) (M62.81)    Time: 0933-1000 PT Time Calculation (min) (ACUTE ONLY): 27 min   Charges:   PT Evaluation $PT Eval Low Complexity: 1 Low PT Treatments $Gait Training: 8-22 mins        Reinaldo Berber, PT, DPT Acute Rehab Services Pager: [phone]   Reinaldo Berber 10/12/2017, 10:31 AM

## 2017-10-12 NOTE — Consult Note (Addendum)
[place] Nurse wound consult note Reason for Consult: two full thickness, chronic left arm wounds Wound type:trauma Pressure Injury POA: NA Measurement:more proximal wound is 2.5cm x 1.5cm cm x 0.1cm. Second wound is 7cm x 2cm above skin level.  Wound BRA:XENM have 100% pink non granulating, slick, moist wound beds. Both are very clean. Drainage (amount, consistency, odor) none Periwound:intact, thin, dry skin Dressing procedure/placement/frequency: I have provided wound care orders for nursing staff to cleanse and apply vaseline gauze, cover with protective foam. Can peel back foam to cleanse and apply new vaseline daily. These wounds are two months old. They are non granulating. Patient has not had albumin, pre-albumin and protein levels checked with her labs but would guess she is somewhat malnourished. In addition to questionable nutritional deficits she has anemia and some renal insufficiency which could deter wound healing. Would recommend a nutritional consult or at least some protein supplements to assist in wound healing. We will not follow, but will remain available to this patient, to nursing, and the medical and/or surgical teams.  Please re-consult if we need to assist further.  Fara Olden, RN-C, WTA-C, [place] [facility] Treatment Associate Ostomy Care Associate

## 2017-10-12 NOTE — Progress Notes (Signed)
LATE ENTRY FROM YESTERDAY. I have discussed my plans with RN and also PCCM yesterday  Subjective:  No new symptoms. No dyspnea, leg edema or chest pain.  Objective:  Vital Signs in the last 24 hours: Temp:  [97.2 F (36.2 C)-98.3 F (36.8 C)] 98.3 F (36.8 C) (08/14 1554) Resp:  [14-28] 25 (08/14 1200) BP: (88-163)/(53-145) 134/89 (08/14 1200) SpO2:  [93 %-96 %] 95 % (08/14 1200) Weight:  [43.6 kg] 43.6 kg (08/14 0500)  Intake/Output from previous day: 08/13 0701 - 08/14 0700 In: 1363.3 [P.O.:240; I.V.:1123.3] Out: 551 [Urine:551]  Physical Exam: Blood pressure 134/89, pulse 80, temperature 98.3 F (36.8 C), temperature source Oral, resp. rate (!) 25, height 5\' 6"  (1.676 m), weight 43.6 kg, SpO2 95 %. Physical Exam  Constitutional: She is oriented to person, place, and time.  Petite, no acute distress.   Eyes: Conjunctivae are normal.  Neck: Neck supple. No JVD present.  Cardiovascular: Normal rate. Exam reveals no gallop.  Murmur (2/6 mid systolic murmur in left parasternal border) heard. Pulmonary/Chest: Effort normal and breath sounds normal. No stridor.  Abdominal: Soft. Bowel sounds are normal.  Musculoskeletal: Normal range of motion. She exhibits no edema. Deformity: Bilater upper extremity cover in bandages for ulceration.  Neurological: She is alert and oriented to person, place, and time.  Skin: Skin is warm and dry. There is erythema (Bilateral upper extremity covered in bandages for cellulitis).  Psychiatric: Affect normal.   Lab Results: BMP Recent Labs    10/11/17 0749 10/11/17 1903 10/12/17 0227  NA 141 140 141  K 3.7 3.6 3.7  CL 92* 94* 99  CO2 37* 33* 31  GLUCOSE 101* 119* 120*  BUN 29* 26* 26*  CREATININE 2.09* 1.97* 1.79*  CALCIUM 9.6 9.2 8.9  GFRNONAA 20* 21* 24*  GFRAA 23* 24* 27*    CBC Recent Labs  Lab 10/12/17 0227  WBC 7.0  RBC 3.35*  HGB 10.5*  HCT 33.6*  PLT 153  MCV 100.3*  MCH 31.3  MCHC 31.3  RDW 14.1    Cardiac  Panel (last 3 results) Recent Labs    10/10/17 1722  CKTOTAL 58  TSH Recent Labs    10/10/17 1722  TSH 2.029    Imaging: US Renal  Result Date: 10/11/2017 CLINICAL DATA:  Acute renal failure. EXAM: RENAL / URINARY TRACT ULTRASOUND COMPLETE COMPARISON:  None. FINDINGS: Right Kidney: Length: 8.6 cm. The cortex is thinned and cortical echogenicity is increased. Simple cyst measuring 0.8 cm incidentally noted. No hydronephrosis. Left Kidney: Length: 9.6 cm. The cortex is thinned and cortical echogenicity is increased. No hydronephrosis. Bladder: Appears normal for degree of bladder distention. IMPRESSION: No acute abnormality. Negative for hydronephrosis. Senescent change both kidneys. Electronically Signed   By: Inge Rise M.D.   On: 10/11/2017 12:51    CARDIAC STUDIES:  07/08/2015: Right heart catheterization, arch aortogram, selective right subclavian arteriogram and right axillary artery angioplasty. 1. Mild pulmonary hypertension with preserved cardiac output and cardiac index by 4.37 and 2.91. PA pressure 37/15/mean 23 mmHg. Findings consistent with probable primary pulmonary hypertension, pulmonary capillary wedge was 11 mmHg. 2. 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  3. 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 Right subdural artery shows mild diffuse disease. 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 4. Successful PTA and stenting of the right axillary artery with implantation of a 5.0 x 18 mm Innova  self-expanding stent to the right axillary artery, stenosis reduced from 100% to 0% (CTO).  EKG 10/10/2017: Complete heart block with junctional escape rhythm at the rate of 28 bpm.  No evidence of ischemia, tall hyperacute T waves noted suggestive of hyperkalemia.  Telemetry: 10/11/2017: Normal sinus rhythm.  Echocardiogram 10/11/2017:  Hyperdynamic LV, EF  65-70%.  Grade 1 diastolic dysfunction with elevated left atrial pressure.  Severely calcified aortic valve and mitral and the calcification without stenosis in the aortic valve and trace stenosis in the mitral valve.  Small free-floating pericardial effusion.  Moderate ulnarly hypertension, PA pressure 49 mmHg.  Office ECHO: 04/13/2017: Normal LV size, septal flattening suggestive of right-sided pressure overload pattern.  Diastolic function could not be assessed.  EF calculated at 57%.  Mild MR, moderate to severe TR, PA pressure 45 mmHg.  ASSESSMENT AND PLAN:  1.  Complete heart block probably secondary to hyperkalemia from acute renal failure, stage IV. Resolved  2.  Pulmonary hypertension presently on sildenafil 20 mg p.o. 3 times daily but has been taking only twice a day.  Overall has done well with no recurrence of leg edema and dyspnea has improved. Continue the same  3.  Hypertension presently off of meds and controlled. Agree on holding diuretics.  4.  Coronary artery disease with remote angioplasty and stenting in 1999 5.  Peripheral arterial disease with right axillary artery stenosis status post stenting in May 2017  Recommendation: Stable from cardiac status. Reviewed echo, PA pressure stable and no RV strain. Heart block resolved and no indication for pacemaker. I will f/u in the out patient basis and she has an appointment on 10/24/2017  Adrian Prows, M.D. 10/12/2017, 5:33 PM [facility] Pager: [phone] Office: [phone] If no answer: [phone]

## 2017-10-12 NOTE — Progress Notes (Signed)
Initial Nutrition Assessment  DOCUMENTATION CODES:   Underweight  INTERVENTION:   Ensure Enlive po BID, each supplement provides 350 kcal and 20 grams of protein Vanilla is preference    NUTRITION DIAGNOSIS:   Increased nutrient needs related to acute illness(AKI) as evidenced by estimated needs.  GOAL:   Patient will meet greater than or equal to 90% of their needs  MONITOR:   PO intake, Supplement acceptance, Skin  REASON FOR ASSESSMENT:   Consult Assessment of nutrition requirement/status  ASSESSMENT:   Pt with PMH of HTN admitted with generalized weakness found to be in third-degree AV block and hyperkalemic likely due to renal failure.    Pt and her daytime caregiver provided hx. Per pt she weighed 135 lb years ago but for > 1 year pt has been around 100 lb. Per pt at her last doctor visit she was up to 109 lb. She eats 2 meals per day and one ensure per day.  Breakfast: egg, bacon, toast, up to 16 oz OJ, and coffee. Pt has her morning meal starting around 9 am but does not finish until around 10:30 1:30 snack: vanilla ensure 2:30/3 pm either a [employer] frozen meal (pot pie) or take out from restaurant (meat, starch, veggie) this meal usually starts at 2:30/3 pm and she does not finish until 4:30/5 pm No snacks at night but caregiver does leave out tea They are working to get her drinking more fluids.   Pt has no trouble chewing or swallowing.  Breakfast tray at bedside. Pt had consumed 75% of her meal.  Pt does not meet criteria for malnutrition at this time but with muscle depletion and low BMI pt is at a low threshold for developing malnutrition.   Medications reviewed  Labs reviewed: magnesium 1.6 (L)   I/O: +1891 ml since admit UOP: 551 ml x 24 hrs    NUTRITION - FOCUSED PHYSICAL EXAM:    Most Recent Value  Orbital Region  No depletion  Upper Arm Region  No depletion  Thoracic and Lumbar Region  No depletion  Buccal Region  Mild depletion  Temple  Region  Moderate depletion  Clavicle Bone Region  Severe depletion  Clavicle and Acromion Bone Region  Severe depletion  Scapular Bone Region  Severe depletion  Dorsal Hand  Severe depletion  Patellar Region  No depletion  Anterior Thigh Region  No depletion  Posterior Calf Region  No depletion  Edema (RD Assessment)  None  Hair  Reviewed  Eyes  Reviewed  Mouth  Reviewed  Skin  Reviewed  Nails  Reviewed       Diet Order:   Diet Order            Diet regular Room service appropriate? Yes; Fluid consistency: Thin  Diet effective now              EDUCATION NEEDS:   Education needs have been addressed  Skin:  Skin Assessment: Skin Integrity Issues: Skin Integrity Issues:: DTI DTI: left arm/degloving   Last BM:  8/12 large (type 7)  Height:   Ht Readings from Last 1 Encounters:  10/10/17 5\' 6"  (1.676 m)    Weight:   Wt Readings from Last 1 Encounters:  10/12/17 43.6 kg    Ideal Body Weight:  61.8 kg  BMI:  Body mass index is 15.51 kg/m.  Estimated Nutritional Needs:   Kcal:  1200-1450  Protein:  55-70 grams  Fluid:  > 1.5 L/day  Maylon Peppers RD, LDN, CNSC  [phone] Pager [phone] After Hours Pager

## 2017-10-12 NOTE — Progress Notes (Addendum)
0730 Bedside shift report. Pt sleeping, easy to arouse. NAD, fall precautions in place, VSS, caregiver at bedside.   0800 Pt assessed, see flow sheet. Updated with POC. Pt assisted to bathroom, pt sitting in chair eating breakfast. Pt refusing to wear blood pressure cuff on leg. RN attempted 0800 BP but pt kicking leg out in pain. Will discuss with MD in rounds to see if vital sign frequency can be changed.  0945 Pt up walking hallways with physical therapy. Denies pain, CP, NAD, WCTM.   1100 Pt sitting up eating cookies and coffee, awaiting MD for rounds. Caregiver at bedside. Fall precautions in place. WCTM.   1645 Pt c/o nausea and vomiting. Niece at bedside and stated, "it's because she hasn't had her protonix", she stated it was, "acid reflux". MD paged for orders.  1700 Orders received for zofran, medicated pt per [facility].   1800 RN called to room. Pt assisted to bathroom. Denies pain, n/v, SOB and CP. Niece updated with POC. Fall precautions in place, [facility].

## 2017-10-12 NOTE — Progress Notes (Signed)
PROGRESS NOTE    Joyce Oliver  MRN:[id number] DOB: 11/02/1924 DOA: 10/10/2017 PCP: Ramachandran, Ajith, MD  Outpatient Specialists:     Brief Narrative:  Patient is a 84-year-old Caucasian female past medical history significant for chronic kidney disease, multiple myeloma, diabetes mellitus, congestive heart failure, asthma, pulmonary hypertension collagen vascular disease.  Patient was admitted to the critical care team with complete heart block and hyperkalemia.  Patient has been managed conservatively.  With resolution of her acute kidney injury, hyperkalemia, to complete heart block has resolved.  Patient has been transferred to the hospitalist team.  Likely, patient will be discharged in the morning.   Assessment & Plan:   Active Problems:   Complete heart block (HCC)   Third degree AV block (HCC)   Complete heart block: This has resolved with resolution of acute kidney injury and hyperkalemia. Continue to monitor patient for now. Patient remains asymptomatic.  Acute kidney injury on chronic kidney disease stage III: Acute kidney injury has resolved. Serum creatinine is trending downwards. Continue to monitor.  Pulmonary hypertension: This is chronic.  DVT prophylaxis: Subcutaneous heparin Code Status: Full Family Communication: Caretaker present during the consultation Disposition Plan: Likely discharge back home   Consultants:   Patient has been looked after by Dr. Ganji, cardiologist  Procedures:   None  Antimicrobials:   None   Subjective: No new complaints. Patient is not back to baseline.  Objective: Vitals:   10/12/17 1200 10/12/17 1209 10/12/17 1554 10/12/17 1636  BP: 134/89   (!) 164/74  Pulse:      Resp: (!) 25   18  Temp:  98 F (36.7 C) 98.3 F (36.8 C)   TempSrc:  Oral Oral   SpO2: 95%   94%  Weight:      Height:        Intake/Output Summary (Last 24 hours) at 10/12/2017 1906 Last data filed at 10/12/2017 1800 Gross per 24 hour   Intake 1998.75 ml  Output -  Net 1998.75 ml   Filed Weights   10/10/17 2031 10/11/17 0500 10/12/17 0500  Weight: 45.4 kg 45.4 kg 43.6 kg    Examination:  General exam: Appears calm and comfortable  Respiratory system: Clear to auscultation. Respiratory effort normal. Cardiovascular system: S1 & S2 heard, RRR. No JVD, murmurs, rubs, gallops or clicks. No pedal edema. Gastrointestinal system: Abdomen is nondistended, soft and nontender. No organomegaly or masses felt. Normal bowel sounds heard. Central nervous system: Alert and oriented. No focal neurological deficits. Extremities: Symmetric 5 x 5 power. Skin: No rashes, lesions or ulcers Psychiatry: Judgement and insight appear normal. Mood & affect appropriate.     Data Reviewed: I have personally reviewed following labs and imaging studies  CBC: Recent Labs  Lab 10/10/17 1407 10/10/17 1414 10/12/17 0227  WBC 8.9  --  7.0  HGB 11.7* 11.9* 10.5*  HCT 36.8 35.0* 33.6*  MCV 97.6  --  100.3*  PLT 241  --  153   Basic Metabolic Panel: Recent Labs  Lab 10/10/17 1407  10/11/17 0010 10/11/17 0339 10/11/17 0749 10/11/17 1903 10/12/17 0227  NA 140   < > 141 143 141 140 141  K 6.4*   < > 4.4 3.8 3.7 3.6 3.7  CL 105   < > 100 96* 92* 94* 99  CO2 24   < > 29 35* 37* 33* 31  GLUCOSE 113*   < > 105* 120* 101* 119* 120*  BUN 38*   < > 33* 29* 29*   26* 26*  CREATININE 2.15*   < > 2.09* 2.16* 2.09* 1.97* 1.79*  CALCIUM 10.1   < > 10.5* 10.1 9.6 9.2 8.9  MG 2.3  --   --   --   --   --  1.6*  PHOS  --   --   --   --   --   --  3.8   < > = values in this interval not displayed.   GFR: Estimated Creatinine Clearance: 14.1 mL/min (A) (by C-G formula based on SCr of 1.79 mg/dL (H)). Liver Function Tests: No results for input(s): AST, ALT, ALKPHOS, BILITOT, PROT, ALBUMIN in the last 168 hours. No results for input(s): LIPASE, AMYLASE in the last 168 hours. No results for input(s): AMMONIA in the last 168 hours. Coagulation  Profile: No results for input(s): INR, PROTIME in the last 168 hours. Cardiac Enzymes: Recent Labs  Lab 10/10/17 1722  CKTOTAL 58   BNP (last 3 results) No results for input(s): PROBNP in the last 8760 hours. HbA1C: No results for input(s): HGBA1C in the last 72 hours. CBG: No results for input(s): GLUCAP in the last 168 hours. Lipid Profile: No results for input(s): CHOL, HDL, LDLCALC, TRIG, CHOLHDL, LDLDIRECT in the last 72 hours. Thyroid Function Tests: Recent Labs    10/10/17 1722  TSH 2.029  FREET4 0.67*   Anemia Panel: No results for input(s): VITAMINB12, FOLATE, FERRITIN, TIBC, IRON, RETICCTPCT in the last 72 hours. Urine analysis:    Component Value Date/Time   COLORURINE YELLOW 02/28/2008 1000   APPEARANCEUR CLEAR 02/28/2008 1000   LABSPEC 1.023 02/28/2008 1000   PHURINE 5.5 02/28/2008 1000   GLUCOSEU NEGATIVE 02/28/2008 1000   HGBUR NEGATIVE 02/28/2008 1000   BILIRUBINUR SMALL (A) 02/28/2008 1000   KETONESUR TRACE (A) 02/28/2008 1000   PROTEINUR NEGATIVE 02/28/2008 1000   UROBILINOGEN 1.0 02/28/2008 1000   NITRITE NEGATIVE 02/28/2008 1000   LEUKOCYTESUR MODERATE (A) 02/28/2008 1000   Sepsis Labs: _0 (procalcitonin:4,lacticidven:4)  ) Recent Results (from the past 240 hour(s))  MRSA PCR Screening     Status: None   Collection Time: 10/10/17  7:21 PM  Result Value Ref Range Status   MRSA by PCR NEGATIVE NEGATIVE Final    Comment:        The GeneXpert MRSA Assay (FDA approved for NASAL specimens only), is one component of a comprehensive MRSA colonization surveillance program. It is not intended to diagnose MRSA infection nor to guide or monitor treatment for MRSA infections. Performed at [facility], [place] [address] [place], [place] [address]          Radiology Studies: US Renal  Result Date: 10/11/2017 CLINICAL DATA:  Acute renal failure. EXAM: RENAL / URINARY TRACT ULTRASOUND COMPLETE COMPARISON:  None. FINDINGS: Right  Kidney: Length: 8.6 cm. The cortex is thinned and cortical echogenicity is increased. Simple cyst measuring 0.8 cm incidentally noted. No hydronephrosis. Left Kidney: Length: 9.6 cm. The cortex is thinned and cortical echogenicity is increased. No hydronephrosis. Bladder: Appears normal for degree of bladder distention. IMPRESSION: No acute abnormality. Negative for hydronephrosis. Senescent change both kidneys. Electronically Signed   By: Inge Rise M.D.   On: 10/11/2017 12:51        Scheduled Meds: . feeding supplement (ENSURE ENLIVE)  237 mL Oral BID BM  . heparin  5,000 Units Subcutaneous Q8H   Continuous Infusions: . sodium chloride Stopped (10/10/17 2014)  . sodium chloride    . sodium chloride 50 mL/hr at 10/12/17 1800  LOS: 2 days    Time spent: 35 minutes    Dana Allan, MD  [facility] Pager #: [phone] 7PM-7AM contact night coverage as above

## 2017-10-13 MED ORDER — LOVASTATIN 40 MG PO TABS: 20.0000 mg | ORAL_TABLET | Freq: Every day | ORAL | 0 refills | Status: AC

## 2017-10-13 MED ORDER — ENSURE ENLIVE PO LIQD: 237.0000 mL | Freq: Two times a day (BID) | ORAL | 12 refills | Status: AC

## 2017-10-13 MED ORDER — TOLTERODINE TARTRATE ER 2 MG PO CP24: 2.0000 mg | ORAL_CAPSULE | Freq: Every day | ORAL | 0 refills | Status: AC

## 2017-10-13 NOTE — Progress Notes (Signed)
Discharge instructions given to patients POA with no questions. Patient dressed and discharged via wheelchair with no complaints.

## 2017-10-13 NOTE — Discharge Summary (Signed)
Physician Discharge Summary  Patient ID: Joyce Oliver MRN: [id number] DOB/AGE: 04-21-1924 84 y.o.  Admit date: 10/10/2017 Discharge date: 10/13/2017  Admission Diagnoses:  Discharge Diagnoses:  Active Problems:   Complete heart block (HCC)   Third degree AV block (HCC)   Discharged Condition: stable  Hospital Course:  Patient is a 84 year old Caucasian female past medical history significant for chronic kidney disease stage III with baseline serum creatinine of 1.12, multiple myeloma, diabetes mellitus, congestive heart failure, asthma, pulmonary hypertension collagen vascular disease.  Patient was admitted to the critical care team with complete heart block and hyperkalemia.    Patient was admitted with complete heart block and acute kidney injury on chronic kidney disease stage III, with hyperkalemia.  On presentation to the hospital, the serum creatinine was 2.15 and the potassium was 6.4.  The patient was managed by the cardiology and the critical care team, was eventually transferred to the hospitalist service.  With resolution of the acute kidney injury and hyperkalemia, to complete heart block resolved.  After extensive discussion with the patient's family, the family has opted to take patient home with home health, with an understanding that if this fails, the patient will be placed at a facility with higher level of care.    Complete heart block: This has resolved with resolution of acute kidney injury and hyperkalemia. Continue to monitor patient closely.   Patient remains asymptomatic.  Acute kidney injury on chronic kidney disease stage III: Acute kidney injury has resolved. Serum creatinine is trending downwards. Continue to monitor.  Pulmonary hypertension: This is chronic. Continue current medication (Revatio)   Consults: cardiology, critical care team (patient was initially managed by cardiology and critical care team, and transferred to the hospitalist team  eventually)  Discharge Exam: Blood pressure 120/63, pulse 77, temperature 99 F (37.2 C), temperature source Oral, resp. rate 17, height '5\' 6"'  (1.676 m), weight 43.6 kg, SpO2 97 %.   Disposition: Discharge disposition: 06-Home-Health Care Svc   Discharge Instructions    Diet - low sodium heart healthy   Complete by:  As directed    Increase activity slowly   Complete by:  As directed      Allergies as of 10/13/2017      Reactions   Penicillins Other (See Comments)   Has patient had a PCN reaction causing immediate rash, facial/tongue/throat swelling, SOB or lightheadedness with hypotension: Unknown Has patient had a PCN reaction causing severe rash involving mucus membranes or skin necrosis: No Has patient had a PCN reaction that required hospitalization No Has patient had a PCN reaction occurring within the last 10 years: No If all of the above answers are "NO", then may proceed with Cephalosporin use.   Codeine Nausea And Vomiting   Morphine And Related Nausea And Vomiting      Medication List    STOP taking these medications   acetaminophen 500 MG tablet Commonly known as:  TYLENOL   aspirin 81 MG tablet   clindamycin 150 MG capsule Commonly known as:  CLEOCIN   clopidogrel 75 MG tablet Commonly known as:  PLAVIX   ibuprofen 200 MG tablet Commonly known as:  ADVIL,MOTRIN   metoprolol tartrate 25 MG tablet Commonly known as:  LOPRESSOR   pantoprazole 40 MG tablet Commonly known as:  PROTONIX   ranitidine 150 MG capsule Commonly known as:  ZANTAC   sulfamethoxazole-trimethoprim 800-160 MG tablet Commonly known as:  BACTRIM DS,SEPTRA DS     TAKE these medications   albuterol 108 (  90 Base) MCG/ACT inhaler Commonly known as:  PROVENTIL HFA;VENTOLIN HFA Inhale 2 puffs into the lungs every 4 (four) hours as needed for wheezing or shortness of breath.   budesonide-formoterol 80-4.5 MCG/ACT inhaler Commonly known as:  SYMBICORT Inhale 2 puffs into the lungs  2 (two) times daily.   feeding supplement (ENSURE ENLIVE) Liqd Take 237 mLs by mouth 2 (two) times daily between meals.   lovastatin 40 MG tablet Commonly known as:  MEVACOR Take 0.5 tablets (20 mg total) by mouth daily after supper. What changed:  how much to take   multivitamin with minerals Tabs tablet Take 1 tablet by mouth daily.   polyethylene glycol packet Commonly known as:  MIRALAX / GLYCOLAX Take 17 g by mouth daily as needed (constipation). Mix in cup of coffee and drink   sildenafil 20 MG tablet Commonly known as:  REVATIO Take 20 mg by mouth 2 (two) times daily.   tolterodine 2 MG 24 hr capsule Commonly known as:  DETROL LA Take 1 capsule (2 mg total) by mouth daily after supper. What changed:    medication strength  how much to take   vitamin B-12 500 MCG tablet Commonly known as:  CYANOCOBALAMIN Take 500 mcg by mouth daily.      Time spent discharging patient: 35 minutes.  SignedBonnell Public 10/13/2017, 1:48 PM

## 2017-10-13 NOTE — Care Management Note (Signed)
Case Management Note Marvetta Gibbons RN,BSN Unit 2H 1-22 - RN Care Coordinator (Case Management) [phone]  Patient Details  Name: MONTANNA MCBAIN MRN: [id number] Date of Birth: 27-Sep-1924  Subjective/Objective:   Pt admitted with complete heart block per cards will manage medically, has f/u with cards on 8/26                   Action/Plan: PTA pt lived at home has private duty caregivers that were providing support, niece is arranging increased caregiver support 24/7 for discharge. Pt was also active with [facility]- MD has placed resumption orders for HHRN/PT- call placed to [facility] with [facility] for resumption of care. Pt will f/u with Cards and PCP as directed.  Discussed long term plans with niece [name]- pt does have [place] care policy that they could use in future when no longer able to provide private duty at home.   Expected Discharge Date:  10/13/17               Expected Discharge Plan:  [place]  In-House Referral:  NA  Discharge planning Services  CM Consult  Post Acute Care Choice:  Home Health, Resumption of Svcs/PTA Provider Choice offered to:  Patient  DME Arranged:    DME Agency:     HH Arranged:  RN, PT [place] Agency:  [place]  Status of Service:  Completed, signed off  If discussed at [place] of Stay Meetings, dates discussed:    Discharge Disposition: home/home health   Additional Comments:  Dawayne Patricia, RN 10/13/2017, 2:15 PM

## 2017-10-14 DIAGNOSIS — M1990 Unspecified osteoarthritis, unspecified site: Secondary | ICD-10-CM | POA: Diagnosis not present

## 2017-10-14 DIAGNOSIS — M810 Age-related osteoporosis without current pathological fracture: Secondary | ICD-10-CM | POA: Diagnosis not present

## 2017-10-14 DIAGNOSIS — G2581 Restless legs syndrome: Secondary | ICD-10-CM | POA: Diagnosis not present

## 2017-10-14 DIAGNOSIS — R69 Illness, unspecified: Secondary | ICD-10-CM | POA: Diagnosis not present

## 2017-10-14 DIAGNOSIS — S82001D Unspecified fracture of right patella, subsequent encounter for closed fracture with routine healing: Secondary | ICD-10-CM | POA: Diagnosis not present

## 2017-10-14 DIAGNOSIS — K219 Gastro-esophageal reflux disease without esophagitis: Secondary | ICD-10-CM | POA: Diagnosis not present

## 2017-10-14 DIAGNOSIS — K5790 Diverticulosis of intestine, part unspecified, without perforation or abscess without bleeding: Secondary | ICD-10-CM | POA: Diagnosis not present

## 2017-10-14 DIAGNOSIS — I10 Essential (primary) hypertension: Secondary | ICD-10-CM | POA: Diagnosis not present

## 2017-10-14 DIAGNOSIS — E782 Mixed hyperlipidemia: Secondary | ICD-10-CM | POA: Diagnosis not present

## 2017-10-14 DIAGNOSIS — I2510 Atherosclerotic heart disease of native coronary artery without angina pectoris: Secondary | ICD-10-CM | POA: Diagnosis not present

## 2017-10-19 DIAGNOSIS — I270 Primary pulmonary hypertension: Secondary | ICD-10-CM | POA: Diagnosis not present

## 2017-10-19 DIAGNOSIS — L03114 Cellulitis of left upper limb: Secondary | ICD-10-CM | POA: Diagnosis not present

## 2017-10-19 DIAGNOSIS — N183 Chronic kidney disease, stage 3 (moderate): Secondary | ICD-10-CM | POA: Diagnosis not present

## 2017-10-19 DIAGNOSIS — N179 Acute kidney failure, unspecified: Secondary | ICD-10-CM | POA: Diagnosis not present

## 2017-10-19 DIAGNOSIS — I442 Atrioventricular block, complete: Secondary | ICD-10-CM | POA: Diagnosis not present

## 2017-10-21 DIAGNOSIS — K219 Gastro-esophageal reflux disease without esophagitis: Secondary | ICD-10-CM | POA: Diagnosis not present

## 2017-10-21 DIAGNOSIS — I10 Essential (primary) hypertension: Secondary | ICD-10-CM | POA: Diagnosis not present

## 2017-10-21 DIAGNOSIS — I2510 Atherosclerotic heart disease of native coronary artery without angina pectoris: Secondary | ICD-10-CM | POA: Diagnosis not present

## 2017-10-21 DIAGNOSIS — G2581 Restless legs syndrome: Secondary | ICD-10-CM | POA: Diagnosis not present

## 2017-10-21 DIAGNOSIS — S82001D Unspecified fracture of right patella, subsequent encounter for closed fracture with routine healing: Secondary | ICD-10-CM | POA: Diagnosis not present

## 2017-10-21 DIAGNOSIS — M810 Age-related osteoporosis without current pathological fracture: Secondary | ICD-10-CM | POA: Diagnosis not present

## 2017-10-21 DIAGNOSIS — R69 Illness, unspecified: Secondary | ICD-10-CM | POA: Diagnosis not present

## 2017-10-21 DIAGNOSIS — M1990 Unspecified osteoarthritis, unspecified site: Secondary | ICD-10-CM | POA: Diagnosis not present

## 2017-10-21 DIAGNOSIS — E782 Mixed hyperlipidemia: Secondary | ICD-10-CM | POA: Diagnosis not present

## 2017-10-21 DIAGNOSIS — K5790 Diverticulosis of intestine, part unspecified, without perforation or abscess without bleeding: Secondary | ICD-10-CM | POA: Diagnosis not present

## 2017-10-23 DIAGNOSIS — J45909 Unspecified asthma, uncomplicated: Secondary | ICD-10-CM | POA: Diagnosis not present

## 2017-10-23 DIAGNOSIS — R69 Illness, unspecified: Secondary | ICD-10-CM | POA: Diagnosis not present

## 2017-10-23 DIAGNOSIS — N183 Chronic kidney disease, stage 3 (moderate): Secondary | ICD-10-CM | POA: Diagnosis not present

## 2017-10-23 DIAGNOSIS — I130 Hypertensive heart and chronic kidney disease with heart failure and stage 1 through stage 4 chronic kidney disease, or unspecified chronic kidney disease: Secondary | ICD-10-CM | POA: Diagnosis not present

## 2017-10-23 DIAGNOSIS — C9000 Multiple myeloma not having achieved remission: Secondary | ICD-10-CM | POA: Diagnosis not present

## 2017-10-23 DIAGNOSIS — I2510 Atherosclerotic heart disease of native coronary artery without angina pectoris: Secondary | ICD-10-CM | POA: Diagnosis not present

## 2017-10-23 DIAGNOSIS — S82001D Unspecified fracture of right patella, subsequent encounter for closed fracture with routine healing: Secondary | ICD-10-CM | POA: Diagnosis not present

## 2017-10-23 DIAGNOSIS — I442 Atrioventricular block, complete: Secondary | ICD-10-CM | POA: Diagnosis not present

## 2017-10-23 DIAGNOSIS — S50812D Abrasion of left forearm, subsequent encounter: Secondary | ICD-10-CM | POA: Diagnosis not present

## 2017-10-23 DIAGNOSIS — I509 Heart failure, unspecified: Secondary | ICD-10-CM | POA: Diagnosis not present

## 2017-10-24 DIAGNOSIS — N183 Chronic kidney disease, stage 3 (moderate): Secondary | ICD-10-CM | POA: Diagnosis not present

## 2017-10-24 DIAGNOSIS — J45909 Unspecified asthma, uncomplicated: Secondary | ICD-10-CM | POA: Diagnosis not present

## 2017-10-24 DIAGNOSIS — I2510 Atherosclerotic heart disease of native coronary artery without angina pectoris: Secondary | ICD-10-CM | POA: Diagnosis not present

## 2017-10-24 DIAGNOSIS — I442 Atrioventricular block, complete: Secondary | ICD-10-CM | POA: Diagnosis not present

## 2017-10-24 DIAGNOSIS — I270 Primary pulmonary hypertension: Secondary | ICD-10-CM | POA: Diagnosis not present

## 2017-10-24 DIAGNOSIS — S82001D Unspecified fracture of right patella, subsequent encounter for closed fracture with routine healing: Secondary | ICD-10-CM | POA: Diagnosis not present

## 2017-10-24 DIAGNOSIS — I509 Heart failure, unspecified: Secondary | ICD-10-CM | POA: Diagnosis not present

## 2017-10-24 DIAGNOSIS — R69 Illness, unspecified: Secondary | ICD-10-CM | POA: Diagnosis not present

## 2017-10-24 DIAGNOSIS — I130 Hypertensive heart and chronic kidney disease with heart failure and stage 1 through stage 4 chronic kidney disease, or unspecified chronic kidney disease: Secondary | ICD-10-CM | POA: Diagnosis not present

## 2017-10-24 DIAGNOSIS — C9000 Multiple myeloma not having achieved remission: Secondary | ICD-10-CM | POA: Diagnosis not present

## 2017-10-24 DIAGNOSIS — E875 Hyperkalemia: Secondary | ICD-10-CM | POA: Diagnosis not present

## 2017-10-24 DIAGNOSIS — I10 Essential (primary) hypertension: Secondary | ICD-10-CM | POA: Diagnosis not present

## 2017-10-24 DIAGNOSIS — I25119 Atherosclerotic heart disease of native coronary artery with unspecified angina pectoris: Secondary | ICD-10-CM | POA: Diagnosis not present

## 2017-10-24 DIAGNOSIS — S50812D Abrasion of left forearm, subsequent encounter: Secondary | ICD-10-CM | POA: Diagnosis not present

## 2017-10-26 DIAGNOSIS — S50812D Abrasion of left forearm, subsequent encounter: Secondary | ICD-10-CM | POA: Diagnosis not present

## 2017-10-26 DIAGNOSIS — C9000 Multiple myeloma not having achieved remission: Secondary | ICD-10-CM | POA: Diagnosis not present

## 2017-10-26 DIAGNOSIS — I2510 Atherosclerotic heart disease of native coronary artery without angina pectoris: Secondary | ICD-10-CM | POA: Diagnosis not present

## 2017-10-26 DIAGNOSIS — J45909 Unspecified asthma, uncomplicated: Secondary | ICD-10-CM | POA: Diagnosis not present

## 2017-10-26 DIAGNOSIS — N183 Chronic kidney disease, stage 3 (moderate): Secondary | ICD-10-CM | POA: Diagnosis not present

## 2017-10-26 DIAGNOSIS — I130 Hypertensive heart and chronic kidney disease with heart failure and stage 1 through stage 4 chronic kidney disease, or unspecified chronic kidney disease: Secondary | ICD-10-CM | POA: Diagnosis not present

## 2017-10-26 DIAGNOSIS — S82001D Unspecified fracture of right patella, subsequent encounter for closed fracture with routine healing: Secondary | ICD-10-CM | POA: Diagnosis not present

## 2017-10-26 DIAGNOSIS — I509 Heart failure, unspecified: Secondary | ICD-10-CM | POA: Diagnosis not present

## 2017-10-26 DIAGNOSIS — I442 Atrioventricular block, complete: Secondary | ICD-10-CM | POA: Diagnosis not present

## 2017-10-26 DIAGNOSIS — R69 Illness, unspecified: Secondary | ICD-10-CM | POA: Diagnosis not present

## 2017-10-27 DIAGNOSIS — N183 Chronic kidney disease, stage 3 (moderate): Secondary | ICD-10-CM | POA: Diagnosis not present

## 2017-10-27 DIAGNOSIS — I130 Hypertensive heart and chronic kidney disease with heart failure and stage 1 through stage 4 chronic kidney disease, or unspecified chronic kidney disease: Secondary | ICD-10-CM | POA: Diagnosis not present

## 2017-10-27 DIAGNOSIS — I2510 Atherosclerotic heart disease of native coronary artery without angina pectoris: Secondary | ICD-10-CM | POA: Diagnosis not present

## 2017-10-27 DIAGNOSIS — R69 Illness, unspecified: Secondary | ICD-10-CM | POA: Diagnosis not present

## 2017-10-27 DIAGNOSIS — S82001D Unspecified fracture of right patella, subsequent encounter for closed fracture with routine healing: Secondary | ICD-10-CM | POA: Diagnosis not present

## 2017-10-27 DIAGNOSIS — J45909 Unspecified asthma, uncomplicated: Secondary | ICD-10-CM | POA: Diagnosis not present

## 2017-10-27 DIAGNOSIS — I442 Atrioventricular block, complete: Secondary | ICD-10-CM | POA: Diagnosis not present

## 2017-10-27 DIAGNOSIS — C9000 Multiple myeloma not having achieved remission: Secondary | ICD-10-CM | POA: Diagnosis not present

## 2017-10-27 DIAGNOSIS — I509 Heart failure, unspecified: Secondary | ICD-10-CM | POA: Diagnosis not present

## 2017-10-27 DIAGNOSIS — S50812D Abrasion of left forearm, subsequent encounter: Secondary | ICD-10-CM | POA: Diagnosis not present

## 2017-10-31 DIAGNOSIS — C9000 Multiple myeloma not having achieved remission: Secondary | ICD-10-CM | POA: Diagnosis not present

## 2017-10-31 DIAGNOSIS — I130 Hypertensive heart and chronic kidney disease with heart failure and stage 1 through stage 4 chronic kidney disease, or unspecified chronic kidney disease: Secondary | ICD-10-CM | POA: Diagnosis not present

## 2017-10-31 DIAGNOSIS — N183 Chronic kidney disease, stage 3 (moderate): Secondary | ICD-10-CM | POA: Diagnosis not present

## 2017-10-31 DIAGNOSIS — I442 Atrioventricular block, complete: Secondary | ICD-10-CM | POA: Diagnosis not present

## 2017-10-31 DIAGNOSIS — J45909 Unspecified asthma, uncomplicated: Secondary | ICD-10-CM | POA: Diagnosis not present

## 2017-10-31 DIAGNOSIS — R69 Illness, unspecified: Secondary | ICD-10-CM | POA: Diagnosis not present

## 2017-10-31 DIAGNOSIS — S82001D Unspecified fracture of right patella, subsequent encounter for closed fracture with routine healing: Secondary | ICD-10-CM | POA: Diagnosis not present

## 2017-10-31 DIAGNOSIS — I2510 Atherosclerotic heart disease of native coronary artery without angina pectoris: Secondary | ICD-10-CM | POA: Diagnosis not present

## 2017-10-31 DIAGNOSIS — I509 Heart failure, unspecified: Secondary | ICD-10-CM | POA: Diagnosis not present

## 2017-10-31 DIAGNOSIS — S50812D Abrasion of left forearm, subsequent encounter: Secondary | ICD-10-CM | POA: Diagnosis not present

## 2017-11-02 DIAGNOSIS — I442 Atrioventricular block, complete: Secondary | ICD-10-CM | POA: Diagnosis not present

## 2017-11-02 DIAGNOSIS — E782 Mixed hyperlipidemia: Secondary | ICD-10-CM | POA: Diagnosis not present

## 2017-11-02 DIAGNOSIS — N183 Chronic kidney disease, stage 3 (moderate): Secondary | ICD-10-CM | POA: Diagnosis not present

## 2017-11-02 DIAGNOSIS — L03114 Cellulitis of left upper limb: Secondary | ICD-10-CM | POA: Diagnosis not present

## 2017-11-02 DIAGNOSIS — I2510 Atherosclerotic heart disease of native coronary artery without angina pectoris: Secondary | ICD-10-CM | POA: Diagnosis not present

## 2017-11-02 DIAGNOSIS — N179 Acute kidney failure, unspecified: Secondary | ICD-10-CM | POA: Diagnosis not present

## 2017-11-03 DIAGNOSIS — J45909 Unspecified asthma, uncomplicated: Secondary | ICD-10-CM | POA: Diagnosis not present

## 2017-11-03 DIAGNOSIS — C9000 Multiple myeloma not having achieved remission: Secondary | ICD-10-CM | POA: Diagnosis not present

## 2017-11-03 DIAGNOSIS — N183 Chronic kidney disease, stage 3 (moderate): Secondary | ICD-10-CM | POA: Diagnosis not present

## 2017-11-03 DIAGNOSIS — S82001D Unspecified fracture of right patella, subsequent encounter for closed fracture with routine healing: Secondary | ICD-10-CM | POA: Diagnosis not present

## 2017-11-03 DIAGNOSIS — R69 Illness, unspecified: Secondary | ICD-10-CM | POA: Diagnosis not present

## 2017-11-03 DIAGNOSIS — I2510 Atherosclerotic heart disease of native coronary artery without angina pectoris: Secondary | ICD-10-CM | POA: Diagnosis not present

## 2017-11-03 DIAGNOSIS — S50812D Abrasion of left forearm, subsequent encounter: Secondary | ICD-10-CM | POA: Diagnosis not present

## 2017-11-03 DIAGNOSIS — I442 Atrioventricular block, complete: Secondary | ICD-10-CM | POA: Diagnosis not present

## 2017-11-03 DIAGNOSIS — I509 Heart failure, unspecified: Secondary | ICD-10-CM | POA: Diagnosis not present

## 2017-11-03 DIAGNOSIS — I130 Hypertensive heart and chronic kidney disease with heart failure and stage 1 through stage 4 chronic kidney disease, or unspecified chronic kidney disease: Secondary | ICD-10-CM | POA: Diagnosis not present

## 2017-11-05 DIAGNOSIS — J45909 Unspecified asthma, uncomplicated: Secondary | ICD-10-CM | POA: Diagnosis not present

## 2017-11-05 DIAGNOSIS — C9000 Multiple myeloma not having achieved remission: Secondary | ICD-10-CM | POA: Diagnosis not present

## 2017-11-05 DIAGNOSIS — I509 Heart failure, unspecified: Secondary | ICD-10-CM | POA: Diagnosis not present

## 2017-11-05 DIAGNOSIS — R69 Illness, unspecified: Secondary | ICD-10-CM | POA: Diagnosis not present

## 2017-11-05 DIAGNOSIS — I2510 Atherosclerotic heart disease of native coronary artery without angina pectoris: Secondary | ICD-10-CM | POA: Diagnosis not present

## 2017-11-05 DIAGNOSIS — I442 Atrioventricular block, complete: Secondary | ICD-10-CM | POA: Diagnosis not present

## 2017-11-05 DIAGNOSIS — I130 Hypertensive heart and chronic kidney disease with heart failure and stage 1 through stage 4 chronic kidney disease, or unspecified chronic kidney disease: Secondary | ICD-10-CM | POA: Diagnosis not present

## 2017-11-05 DIAGNOSIS — S82001D Unspecified fracture of right patella, subsequent encounter for closed fracture with routine healing: Secondary | ICD-10-CM | POA: Diagnosis not present

## 2017-11-05 DIAGNOSIS — N183 Chronic kidney disease, stage 3 (moderate): Secondary | ICD-10-CM | POA: Diagnosis not present

## 2017-11-05 DIAGNOSIS — S50812D Abrasion of left forearm, subsequent encounter: Secondary | ICD-10-CM | POA: Diagnosis not present

## 2017-11-07 DIAGNOSIS — I130 Hypertensive heart and chronic kidney disease with heart failure and stage 1 through stage 4 chronic kidney disease, or unspecified chronic kidney disease: Secondary | ICD-10-CM | POA: Diagnosis not present

## 2017-11-07 DIAGNOSIS — I442 Atrioventricular block, complete: Secondary | ICD-10-CM | POA: Diagnosis not present

## 2017-11-07 DIAGNOSIS — C9000 Multiple myeloma not having achieved remission: Secondary | ICD-10-CM | POA: Diagnosis not present

## 2017-11-07 DIAGNOSIS — J45909 Unspecified asthma, uncomplicated: Secondary | ICD-10-CM | POA: Diagnosis not present

## 2017-11-07 DIAGNOSIS — S50812D Abrasion of left forearm, subsequent encounter: Secondary | ICD-10-CM | POA: Diagnosis not present

## 2017-11-07 DIAGNOSIS — I509 Heart failure, unspecified: Secondary | ICD-10-CM | POA: Diagnosis not present

## 2017-11-07 DIAGNOSIS — N183 Chronic kidney disease, stage 3 (moderate): Secondary | ICD-10-CM | POA: Diagnosis not present

## 2017-11-07 DIAGNOSIS — R69 Illness, unspecified: Secondary | ICD-10-CM | POA: Diagnosis not present

## 2017-11-07 DIAGNOSIS — S82001D Unspecified fracture of right patella, subsequent encounter for closed fracture with routine healing: Secondary | ICD-10-CM | POA: Diagnosis not present

## 2017-11-07 DIAGNOSIS — I2510 Atherosclerotic heart disease of native coronary artery without angina pectoris: Secondary | ICD-10-CM | POA: Diagnosis not present

## 2017-11-08 DIAGNOSIS — N183 Chronic kidney disease, stage 3 (moderate): Secondary | ICD-10-CM | POA: Diagnosis not present

## 2017-11-08 DIAGNOSIS — I509 Heart failure, unspecified: Secondary | ICD-10-CM | POA: Diagnosis not present

## 2017-11-08 DIAGNOSIS — I442 Atrioventricular block, complete: Secondary | ICD-10-CM | POA: Diagnosis not present

## 2017-11-08 DIAGNOSIS — C9000 Multiple myeloma not having achieved remission: Secondary | ICD-10-CM | POA: Diagnosis not present

## 2017-11-08 DIAGNOSIS — S82001D Unspecified fracture of right patella, subsequent encounter for closed fracture with routine healing: Secondary | ICD-10-CM | POA: Diagnosis not present

## 2017-11-08 DIAGNOSIS — I10 Essential (primary) hypertension: Secondary | ICD-10-CM | POA: Diagnosis not present

## 2017-11-08 DIAGNOSIS — J45909 Unspecified asthma, uncomplicated: Secondary | ICD-10-CM | POA: Diagnosis not present

## 2017-11-08 DIAGNOSIS — I130 Hypertensive heart and chronic kidney disease with heart failure and stage 1 through stage 4 chronic kidney disease, or unspecified chronic kidney disease: Secondary | ICD-10-CM | POA: Diagnosis not present

## 2017-11-08 DIAGNOSIS — S50812D Abrasion of left forearm, subsequent encounter: Secondary | ICD-10-CM | POA: Diagnosis not present

## 2017-11-08 DIAGNOSIS — I2510 Atherosclerotic heart disease of native coronary artery without angina pectoris: Secondary | ICD-10-CM | POA: Diagnosis not present

## 2017-11-08 DIAGNOSIS — R69 Illness, unspecified: Secondary | ICD-10-CM | POA: Diagnosis not present

## 2017-11-10 DIAGNOSIS — I509 Heart failure, unspecified: Secondary | ICD-10-CM | POA: Diagnosis not present

## 2017-11-10 DIAGNOSIS — S82001D Unspecified fracture of right patella, subsequent encounter for closed fracture with routine healing: Secondary | ICD-10-CM | POA: Diagnosis not present

## 2017-11-10 DIAGNOSIS — R69 Illness, unspecified: Secondary | ICD-10-CM | POA: Diagnosis not present

## 2017-11-10 DIAGNOSIS — I442 Atrioventricular block, complete: Secondary | ICD-10-CM | POA: Diagnosis not present

## 2017-11-10 DIAGNOSIS — I130 Hypertensive heart and chronic kidney disease with heart failure and stage 1 through stage 4 chronic kidney disease, or unspecified chronic kidney disease: Secondary | ICD-10-CM | POA: Diagnosis not present

## 2017-11-10 DIAGNOSIS — S50812D Abrasion of left forearm, subsequent encounter: Secondary | ICD-10-CM | POA: Diagnosis not present

## 2017-11-10 DIAGNOSIS — C9000 Multiple myeloma not having achieved remission: Secondary | ICD-10-CM | POA: Diagnosis not present

## 2017-11-10 DIAGNOSIS — J45909 Unspecified asthma, uncomplicated: Secondary | ICD-10-CM | POA: Diagnosis not present

## 2017-11-10 DIAGNOSIS — I2510 Atherosclerotic heart disease of native coronary artery without angina pectoris: Secondary | ICD-10-CM | POA: Diagnosis not present

## 2017-11-10 DIAGNOSIS — N183 Chronic kidney disease, stage 3 (moderate): Secondary | ICD-10-CM | POA: Diagnosis not present

## 2017-11-11 DIAGNOSIS — R69 Illness, unspecified: Secondary | ICD-10-CM | POA: Diagnosis not present

## 2017-11-11 DIAGNOSIS — I442 Atrioventricular block, complete: Secondary | ICD-10-CM | POA: Diagnosis not present

## 2017-11-11 DIAGNOSIS — N183 Chronic kidney disease, stage 3 (moderate): Secondary | ICD-10-CM | POA: Diagnosis not present

## 2017-11-11 DIAGNOSIS — C9000 Multiple myeloma not having achieved remission: Secondary | ICD-10-CM | POA: Diagnosis not present

## 2017-11-11 DIAGNOSIS — S50812D Abrasion of left forearm, subsequent encounter: Secondary | ICD-10-CM | POA: Diagnosis not present

## 2017-11-11 DIAGNOSIS — I509 Heart failure, unspecified: Secondary | ICD-10-CM | POA: Diagnosis not present

## 2017-11-11 DIAGNOSIS — S82001D Unspecified fracture of right patella, subsequent encounter for closed fracture with routine healing: Secondary | ICD-10-CM | POA: Diagnosis not present

## 2017-11-11 DIAGNOSIS — I130 Hypertensive heart and chronic kidney disease with heart failure and stage 1 through stage 4 chronic kidney disease, or unspecified chronic kidney disease: Secondary | ICD-10-CM | POA: Diagnosis not present

## 2017-11-11 DIAGNOSIS — I2510 Atherosclerotic heart disease of native coronary artery without angina pectoris: Secondary | ICD-10-CM | POA: Diagnosis not present

## 2017-11-11 DIAGNOSIS — J45909 Unspecified asthma, uncomplicated: Secondary | ICD-10-CM | POA: Diagnosis not present

## 2017-11-14 DIAGNOSIS — S50812D Abrasion of left forearm, subsequent encounter: Secondary | ICD-10-CM | POA: Diagnosis not present

## 2017-11-14 DIAGNOSIS — I2510 Atherosclerotic heart disease of native coronary artery without angina pectoris: Secondary | ICD-10-CM | POA: Diagnosis not present

## 2017-11-14 DIAGNOSIS — I509 Heart failure, unspecified: Secondary | ICD-10-CM | POA: Diagnosis not present

## 2017-11-14 DIAGNOSIS — S82001D Unspecified fracture of right patella, subsequent encounter for closed fracture with routine healing: Secondary | ICD-10-CM | POA: Diagnosis not present

## 2017-11-14 DIAGNOSIS — I442 Atrioventricular block, complete: Secondary | ICD-10-CM | POA: Diagnosis not present

## 2017-11-14 DIAGNOSIS — C9000 Multiple myeloma not having achieved remission: Secondary | ICD-10-CM | POA: Diagnosis not present

## 2017-11-14 DIAGNOSIS — J45909 Unspecified asthma, uncomplicated: Secondary | ICD-10-CM | POA: Diagnosis not present

## 2017-11-14 DIAGNOSIS — N183 Chronic kidney disease, stage 3 (moderate): Secondary | ICD-10-CM | POA: Diagnosis not present

## 2017-11-14 DIAGNOSIS — I130 Hypertensive heart and chronic kidney disease with heart failure and stage 1 through stage 4 chronic kidney disease, or unspecified chronic kidney disease: Secondary | ICD-10-CM | POA: Diagnosis not present

## 2017-11-14 DIAGNOSIS — R69 Illness, unspecified: Secondary | ICD-10-CM | POA: Diagnosis not present

## 2017-11-15 DIAGNOSIS — I2510 Atherosclerotic heart disease of native coronary artery without angina pectoris: Secondary | ICD-10-CM | POA: Diagnosis not present

## 2017-11-15 DIAGNOSIS — I509 Heart failure, unspecified: Secondary | ICD-10-CM | POA: Diagnosis not present

## 2017-11-15 DIAGNOSIS — S82001D Unspecified fracture of right patella, subsequent encounter for closed fracture with routine healing: Secondary | ICD-10-CM | POA: Diagnosis not present

## 2017-11-15 DIAGNOSIS — J45909 Unspecified asthma, uncomplicated: Secondary | ICD-10-CM | POA: Diagnosis not present

## 2017-11-15 DIAGNOSIS — I442 Atrioventricular block, complete: Secondary | ICD-10-CM | POA: Diagnosis not present

## 2017-11-15 DIAGNOSIS — R69 Illness, unspecified: Secondary | ICD-10-CM | POA: Diagnosis not present

## 2017-11-15 DIAGNOSIS — N183 Chronic kidney disease, stage 3 (moderate): Secondary | ICD-10-CM | POA: Diagnosis not present

## 2017-11-15 DIAGNOSIS — C9000 Multiple myeloma not having achieved remission: Secondary | ICD-10-CM | POA: Diagnosis not present

## 2017-11-15 DIAGNOSIS — S50812D Abrasion of left forearm, subsequent encounter: Secondary | ICD-10-CM | POA: Diagnosis not present

## 2017-11-15 DIAGNOSIS — I130 Hypertensive heart and chronic kidney disease with heart failure and stage 1 through stage 4 chronic kidney disease, or unspecified chronic kidney disease: Secondary | ICD-10-CM | POA: Diagnosis not present

## 2017-11-16 DIAGNOSIS — I130 Hypertensive heart and chronic kidney disease with heart failure and stage 1 through stage 4 chronic kidney disease, or unspecified chronic kidney disease: Secondary | ICD-10-CM | POA: Diagnosis not present

## 2017-11-16 DIAGNOSIS — I442 Atrioventricular block, complete: Secondary | ICD-10-CM | POA: Diagnosis not present

## 2017-11-16 DIAGNOSIS — J45909 Unspecified asthma, uncomplicated: Secondary | ICD-10-CM | POA: Diagnosis not present

## 2017-11-16 DIAGNOSIS — I2510 Atherosclerotic heart disease of native coronary artery without angina pectoris: Secondary | ICD-10-CM | POA: Diagnosis not present

## 2017-11-16 DIAGNOSIS — R69 Illness, unspecified: Secondary | ICD-10-CM | POA: Diagnosis not present

## 2017-11-16 DIAGNOSIS — S82001D Unspecified fracture of right patella, subsequent encounter for closed fracture with routine healing: Secondary | ICD-10-CM | POA: Diagnosis not present

## 2017-11-16 DIAGNOSIS — S50812D Abrasion of left forearm, subsequent encounter: Secondary | ICD-10-CM | POA: Diagnosis not present

## 2017-11-16 DIAGNOSIS — I509 Heart failure, unspecified: Secondary | ICD-10-CM | POA: Diagnosis not present

## 2017-11-16 DIAGNOSIS — N183 Chronic kidney disease, stage 3 (moderate): Secondary | ICD-10-CM | POA: Diagnosis not present

## 2017-11-16 DIAGNOSIS — C9000 Multiple myeloma not having achieved remission: Secondary | ICD-10-CM | POA: Diagnosis not present

## 2017-11-17 DIAGNOSIS — I442 Atrioventricular block, complete: Secondary | ICD-10-CM | POA: Diagnosis not present

## 2017-11-17 DIAGNOSIS — R69 Illness, unspecified: Secondary | ICD-10-CM | POA: Diagnosis not present

## 2017-11-17 DIAGNOSIS — I2510 Atherosclerotic heart disease of native coronary artery without angina pectoris: Secondary | ICD-10-CM | POA: Diagnosis not present

## 2017-11-17 DIAGNOSIS — I509 Heart failure, unspecified: Secondary | ICD-10-CM | POA: Diagnosis not present

## 2017-11-17 DIAGNOSIS — S50812D Abrasion of left forearm, subsequent encounter: Secondary | ICD-10-CM | POA: Diagnosis not present

## 2017-11-17 DIAGNOSIS — J45909 Unspecified asthma, uncomplicated: Secondary | ICD-10-CM | POA: Diagnosis not present

## 2017-11-17 DIAGNOSIS — C9000 Multiple myeloma not having achieved remission: Secondary | ICD-10-CM | POA: Diagnosis not present

## 2017-11-17 DIAGNOSIS — S82001D Unspecified fracture of right patella, subsequent encounter for closed fracture with routine healing: Secondary | ICD-10-CM | POA: Diagnosis not present

## 2017-11-17 DIAGNOSIS — N183 Chronic kidney disease, stage 3 (moderate): Secondary | ICD-10-CM | POA: Diagnosis not present

## 2017-11-17 DIAGNOSIS — I130 Hypertensive heart and chronic kidney disease with heart failure and stage 1 through stage 4 chronic kidney disease, or unspecified chronic kidney disease: Secondary | ICD-10-CM | POA: Diagnosis not present

## 2017-11-21 DIAGNOSIS — I130 Hypertensive heart and chronic kidney disease with heart failure and stage 1 through stage 4 chronic kidney disease, or unspecified chronic kidney disease: Secondary | ICD-10-CM | POA: Diagnosis not present

## 2017-11-21 DIAGNOSIS — I509 Heart failure, unspecified: Secondary | ICD-10-CM | POA: Diagnosis not present

## 2017-11-21 DIAGNOSIS — S50812D Abrasion of left forearm, subsequent encounter: Secondary | ICD-10-CM | POA: Diagnosis not present

## 2017-11-21 DIAGNOSIS — I442 Atrioventricular block, complete: Secondary | ICD-10-CM | POA: Diagnosis not present

## 2017-11-21 DIAGNOSIS — I2510 Atherosclerotic heart disease of native coronary artery without angina pectoris: Secondary | ICD-10-CM | POA: Diagnosis not present

## 2017-11-21 DIAGNOSIS — C9000 Multiple myeloma not having achieved remission: Secondary | ICD-10-CM | POA: Diagnosis not present

## 2017-11-21 DIAGNOSIS — S82001D Unspecified fracture of right patella, subsequent encounter for closed fracture with routine healing: Secondary | ICD-10-CM | POA: Diagnosis not present

## 2017-11-21 DIAGNOSIS — R69 Illness, unspecified: Secondary | ICD-10-CM | POA: Diagnosis not present

## 2017-11-21 DIAGNOSIS — N183 Chronic kidney disease, stage 3 (moderate): Secondary | ICD-10-CM | POA: Diagnosis not present

## 2017-11-21 DIAGNOSIS — J45909 Unspecified asthma, uncomplicated: Secondary | ICD-10-CM | POA: Diagnosis not present

## 2017-11-22 DIAGNOSIS — S82001D Unspecified fracture of right patella, subsequent encounter for closed fracture with routine healing: Secondary | ICD-10-CM | POA: Diagnosis not present

## 2017-11-22 DIAGNOSIS — I442 Atrioventricular block, complete: Secondary | ICD-10-CM | POA: Diagnosis not present

## 2017-11-22 DIAGNOSIS — I2510 Atherosclerotic heart disease of native coronary artery without angina pectoris: Secondary | ICD-10-CM | POA: Diagnosis not present

## 2017-11-22 DIAGNOSIS — I130 Hypertensive heart and chronic kidney disease with heart failure and stage 1 through stage 4 chronic kidney disease, or unspecified chronic kidney disease: Secondary | ICD-10-CM | POA: Diagnosis not present

## 2017-11-22 DIAGNOSIS — C9000 Multiple myeloma not having achieved remission: Secondary | ICD-10-CM | POA: Diagnosis not present

## 2017-11-22 DIAGNOSIS — S50812D Abrasion of left forearm, subsequent encounter: Secondary | ICD-10-CM | POA: Diagnosis not present

## 2017-11-22 DIAGNOSIS — R69 Illness, unspecified: Secondary | ICD-10-CM | POA: Diagnosis not present

## 2017-11-22 DIAGNOSIS — J45909 Unspecified asthma, uncomplicated: Secondary | ICD-10-CM | POA: Diagnosis not present

## 2017-11-22 DIAGNOSIS — I509 Heart failure, unspecified: Secondary | ICD-10-CM | POA: Diagnosis not present

## 2017-11-22 DIAGNOSIS — N183 Chronic kidney disease, stage 3 (moderate): Secondary | ICD-10-CM | POA: Diagnosis not present

## 2017-11-24 DIAGNOSIS — S50812D Abrasion of left forearm, subsequent encounter: Secondary | ICD-10-CM | POA: Diagnosis not present

## 2017-11-24 DIAGNOSIS — H43813 Vitreous degeneration, bilateral: Secondary | ICD-10-CM | POA: Diagnosis not present

## 2017-11-24 DIAGNOSIS — Z961 Presence of intraocular lens: Secondary | ICD-10-CM | POA: Diagnosis not present

## 2017-11-24 DIAGNOSIS — H353131 Nonexudative age-related macular degeneration, bilateral, early dry stage: Secondary | ICD-10-CM | POA: Diagnosis not present

## 2017-11-24 DIAGNOSIS — R69 Illness, unspecified: Secondary | ICD-10-CM | POA: Diagnosis not present

## 2017-11-24 DIAGNOSIS — N183 Chronic kidney disease, stage 3 (moderate): Secondary | ICD-10-CM | POA: Diagnosis not present

## 2017-11-24 DIAGNOSIS — I130 Hypertensive heart and chronic kidney disease with heart failure and stage 1 through stage 4 chronic kidney disease, or unspecified chronic kidney disease: Secondary | ICD-10-CM | POA: Diagnosis not present

## 2017-11-24 DIAGNOSIS — I509 Heart failure, unspecified: Secondary | ICD-10-CM | POA: Diagnosis not present

## 2017-11-24 DIAGNOSIS — C9000 Multiple myeloma not having achieved remission: Secondary | ICD-10-CM | POA: Diagnosis not present

## 2017-11-24 DIAGNOSIS — I442 Atrioventricular block, complete: Secondary | ICD-10-CM | POA: Diagnosis not present

## 2017-11-24 DIAGNOSIS — I2510 Atherosclerotic heart disease of native coronary artery without angina pectoris: Secondary | ICD-10-CM | POA: Diagnosis not present

## 2017-11-24 DIAGNOSIS — S82001D Unspecified fracture of right patella, subsequent encounter for closed fracture with routine healing: Secondary | ICD-10-CM | POA: Diagnosis not present

## 2017-11-24 DIAGNOSIS — J45909 Unspecified asthma, uncomplicated: Secondary | ICD-10-CM | POA: Diagnosis not present

## 2017-11-24 DIAGNOSIS — H52203 Unspecified astigmatism, bilateral: Secondary | ICD-10-CM | POA: Diagnosis not present

## 2017-11-25 DIAGNOSIS — C9000 Multiple myeloma not having achieved remission: Secondary | ICD-10-CM | POA: Diagnosis not present

## 2017-11-25 DIAGNOSIS — S82001D Unspecified fracture of right patella, subsequent encounter for closed fracture with routine healing: Secondary | ICD-10-CM | POA: Diagnosis not present

## 2017-11-25 DIAGNOSIS — J45909 Unspecified asthma, uncomplicated: Secondary | ICD-10-CM | POA: Diagnosis not present

## 2017-11-25 DIAGNOSIS — I130 Hypertensive heart and chronic kidney disease with heart failure and stage 1 through stage 4 chronic kidney disease, or unspecified chronic kidney disease: Secondary | ICD-10-CM | POA: Diagnosis not present

## 2017-11-25 DIAGNOSIS — R69 Illness, unspecified: Secondary | ICD-10-CM | POA: Diagnosis not present

## 2017-11-25 DIAGNOSIS — I2510 Atherosclerotic heart disease of native coronary artery without angina pectoris: Secondary | ICD-10-CM | POA: Diagnosis not present

## 2017-11-25 DIAGNOSIS — N183 Chronic kidney disease, stage 3 (moderate): Secondary | ICD-10-CM | POA: Diagnosis not present

## 2017-11-25 DIAGNOSIS — I509 Heart failure, unspecified: Secondary | ICD-10-CM | POA: Diagnosis not present

## 2017-11-25 DIAGNOSIS — S50812D Abrasion of left forearm, subsequent encounter: Secondary | ICD-10-CM | POA: Diagnosis not present

## 2017-11-25 DIAGNOSIS — I442 Atrioventricular block, complete: Secondary | ICD-10-CM | POA: Diagnosis not present

## 2017-11-28 DIAGNOSIS — I2510 Atherosclerotic heart disease of native coronary artery without angina pectoris: Secondary | ICD-10-CM | POA: Diagnosis not present

## 2017-11-28 DIAGNOSIS — S82001D Unspecified fracture of right patella, subsequent encounter for closed fracture with routine healing: Secondary | ICD-10-CM | POA: Diagnosis not present

## 2017-11-28 DIAGNOSIS — S50812D Abrasion of left forearm, subsequent encounter: Secondary | ICD-10-CM | POA: Diagnosis not present

## 2017-11-28 DIAGNOSIS — I442 Atrioventricular block, complete: Secondary | ICD-10-CM | POA: Diagnosis not present

## 2017-11-28 DIAGNOSIS — I130 Hypertensive heart and chronic kidney disease with heart failure and stage 1 through stage 4 chronic kidney disease, or unspecified chronic kidney disease: Secondary | ICD-10-CM | POA: Diagnosis not present

## 2017-11-28 DIAGNOSIS — N183 Chronic kidney disease, stage 3 (moderate): Secondary | ICD-10-CM | POA: Diagnosis not present

## 2017-11-28 DIAGNOSIS — I509 Heart failure, unspecified: Secondary | ICD-10-CM | POA: Diagnosis not present

## 2017-11-28 DIAGNOSIS — J45909 Unspecified asthma, uncomplicated: Secondary | ICD-10-CM | POA: Diagnosis not present

## 2017-11-28 DIAGNOSIS — R69 Illness, unspecified: Secondary | ICD-10-CM | POA: Diagnosis not present

## 2017-11-28 DIAGNOSIS — C9000 Multiple myeloma not having achieved remission: Secondary | ICD-10-CM | POA: Diagnosis not present

## 2017-11-29 DIAGNOSIS — R69 Illness, unspecified: Secondary | ICD-10-CM | POA: Diagnosis not present

## 2017-11-29 DIAGNOSIS — J45909 Unspecified asthma, uncomplicated: Secondary | ICD-10-CM | POA: Diagnosis not present

## 2017-11-29 DIAGNOSIS — I130 Hypertensive heart and chronic kidney disease with heart failure and stage 1 through stage 4 chronic kidney disease, or unspecified chronic kidney disease: Secondary | ICD-10-CM | POA: Diagnosis not present

## 2017-11-29 DIAGNOSIS — I2510 Atherosclerotic heart disease of native coronary artery without angina pectoris: Secondary | ICD-10-CM | POA: Diagnosis not present

## 2017-11-29 DIAGNOSIS — C9000 Multiple myeloma not having achieved remission: Secondary | ICD-10-CM | POA: Diagnosis not present

## 2017-11-29 DIAGNOSIS — I442 Atrioventricular block, complete: Secondary | ICD-10-CM | POA: Diagnosis not present

## 2017-11-29 DIAGNOSIS — I509 Heart failure, unspecified: Secondary | ICD-10-CM | POA: Diagnosis not present

## 2017-11-29 DIAGNOSIS — N183 Chronic kidney disease, stage 3 (moderate): Secondary | ICD-10-CM | POA: Diagnosis not present

## 2017-11-29 DIAGNOSIS — S50812D Abrasion of left forearm, subsequent encounter: Secondary | ICD-10-CM | POA: Diagnosis not present

## 2017-11-29 DIAGNOSIS — S82001D Unspecified fracture of right patella, subsequent encounter for closed fracture with routine healing: Secondary | ICD-10-CM | POA: Diagnosis not present

## 2017-11-30 DIAGNOSIS — I10 Essential (primary) hypertension: Secondary | ICD-10-CM | POA: Diagnosis not present

## 2017-11-30 DIAGNOSIS — I270 Primary pulmonary hypertension: Secondary | ICD-10-CM | POA: Diagnosis not present

## 2017-11-30 DIAGNOSIS — E782 Mixed hyperlipidemia: Secondary | ICD-10-CM | POA: Diagnosis not present

## 2017-11-30 DIAGNOSIS — I2510 Atherosclerotic heart disease of native coronary artery without angina pectoris: Secondary | ICD-10-CM | POA: Diagnosis not present

## 2017-11-30 DIAGNOSIS — N183 Chronic kidney disease, stage 3 (moderate): Secondary | ICD-10-CM | POA: Diagnosis not present

## 2017-11-30 DIAGNOSIS — R69 Illness, unspecified: Secondary | ICD-10-CM | POA: Diagnosis not present

## 2017-12-01 DIAGNOSIS — R69 Illness, unspecified: Secondary | ICD-10-CM | POA: Diagnosis not present

## 2017-12-01 DIAGNOSIS — C9000 Multiple myeloma not having achieved remission: Secondary | ICD-10-CM | POA: Diagnosis not present

## 2017-12-01 DIAGNOSIS — J45909 Unspecified asthma, uncomplicated: Secondary | ICD-10-CM | POA: Diagnosis not present

## 2017-12-01 DIAGNOSIS — M810 Age-related osteoporosis without current pathological fracture: Secondary | ICD-10-CM | POA: Diagnosis not present

## 2017-12-01 DIAGNOSIS — E782 Mixed hyperlipidemia: Secondary | ICD-10-CM | POA: Diagnosis not present

## 2017-12-01 DIAGNOSIS — I2510 Atherosclerotic heart disease of native coronary artery without angina pectoris: Secondary | ICD-10-CM | POA: Diagnosis not present

## 2017-12-01 DIAGNOSIS — S50812D Abrasion of left forearm, subsequent encounter: Secondary | ICD-10-CM | POA: Diagnosis not present

## 2017-12-01 DIAGNOSIS — I130 Hypertensive heart and chronic kidney disease with heart failure and stage 1 through stage 4 chronic kidney disease, or unspecified chronic kidney disease: Secondary | ICD-10-CM | POA: Diagnosis not present

## 2017-12-01 DIAGNOSIS — S82001D Unspecified fracture of right patella, subsequent encounter for closed fracture with routine healing: Secondary | ICD-10-CM | POA: Diagnosis not present

## 2017-12-01 DIAGNOSIS — I129 Hypertensive chronic kidney disease with stage 1 through stage 4 chronic kidney disease, or unspecified chronic kidney disease: Secondary | ICD-10-CM | POA: Diagnosis not present

## 2017-12-01 DIAGNOSIS — I509 Heart failure, unspecified: Secondary | ICD-10-CM | POA: Diagnosis not present

## 2017-12-01 DIAGNOSIS — I442 Atrioventricular block, complete: Secondary | ICD-10-CM | POA: Diagnosis not present

## 2017-12-01 DIAGNOSIS — N183 Chronic kidney disease, stage 3 (moderate): Secondary | ICD-10-CM | POA: Diagnosis not present

## 2017-12-05 DIAGNOSIS — I2510 Atherosclerotic heart disease of native coronary artery without angina pectoris: Secondary | ICD-10-CM | POA: Diagnosis not present

## 2017-12-05 DIAGNOSIS — I509 Heart failure, unspecified: Secondary | ICD-10-CM | POA: Diagnosis not present

## 2017-12-05 DIAGNOSIS — J45909 Unspecified asthma, uncomplicated: Secondary | ICD-10-CM | POA: Diagnosis not present

## 2017-12-05 DIAGNOSIS — N183 Chronic kidney disease, stage 3 (moderate): Secondary | ICD-10-CM | POA: Diagnosis not present

## 2017-12-05 DIAGNOSIS — I130 Hypertensive heart and chronic kidney disease with heart failure and stage 1 through stage 4 chronic kidney disease, or unspecified chronic kidney disease: Secondary | ICD-10-CM | POA: Diagnosis not present

## 2017-12-05 DIAGNOSIS — R69 Illness, unspecified: Secondary | ICD-10-CM | POA: Diagnosis not present

## 2017-12-05 DIAGNOSIS — I442 Atrioventricular block, complete: Secondary | ICD-10-CM | POA: Diagnosis not present

## 2017-12-05 DIAGNOSIS — S50812D Abrasion of left forearm, subsequent encounter: Secondary | ICD-10-CM | POA: Diagnosis not present

## 2017-12-05 DIAGNOSIS — C9000 Multiple myeloma not having achieved remission: Secondary | ICD-10-CM | POA: Diagnosis not present

## 2017-12-05 DIAGNOSIS — S82001D Unspecified fracture of right patella, subsequent encounter for closed fracture with routine healing: Secondary | ICD-10-CM | POA: Diagnosis not present

## 2017-12-06 DIAGNOSIS — S50812D Abrasion of left forearm, subsequent encounter: Secondary | ICD-10-CM | POA: Diagnosis not present

## 2017-12-06 DIAGNOSIS — I130 Hypertensive heart and chronic kidney disease with heart failure and stage 1 through stage 4 chronic kidney disease, or unspecified chronic kidney disease: Secondary | ICD-10-CM | POA: Diagnosis not present

## 2017-12-06 DIAGNOSIS — I509 Heart failure, unspecified: Secondary | ICD-10-CM | POA: Diagnosis not present

## 2017-12-06 DIAGNOSIS — R69 Illness, unspecified: Secondary | ICD-10-CM | POA: Diagnosis not present

## 2017-12-06 DIAGNOSIS — I442 Atrioventricular block, complete: Secondary | ICD-10-CM | POA: Diagnosis not present

## 2017-12-06 DIAGNOSIS — N183 Chronic kidney disease, stage 3 (moderate): Secondary | ICD-10-CM | POA: Diagnosis not present

## 2017-12-06 DIAGNOSIS — I2510 Atherosclerotic heart disease of native coronary artery without angina pectoris: Secondary | ICD-10-CM | POA: Diagnosis not present

## 2017-12-06 DIAGNOSIS — C9000 Multiple myeloma not having achieved remission: Secondary | ICD-10-CM | POA: Diagnosis not present

## 2017-12-06 DIAGNOSIS — S82001D Unspecified fracture of right patella, subsequent encounter for closed fracture with routine healing: Secondary | ICD-10-CM | POA: Diagnosis not present

## 2017-12-06 DIAGNOSIS — J45909 Unspecified asthma, uncomplicated: Secondary | ICD-10-CM | POA: Diagnosis not present

## 2017-12-08 DIAGNOSIS — C9000 Multiple myeloma not having achieved remission: Secondary | ICD-10-CM | POA: Diagnosis not present

## 2017-12-08 DIAGNOSIS — I2510 Atherosclerotic heart disease of native coronary artery without angina pectoris: Secondary | ICD-10-CM | POA: Diagnosis not present

## 2017-12-08 DIAGNOSIS — I130 Hypertensive heart and chronic kidney disease with heart failure and stage 1 through stage 4 chronic kidney disease, or unspecified chronic kidney disease: Secondary | ICD-10-CM | POA: Diagnosis not present

## 2017-12-08 DIAGNOSIS — S82001D Unspecified fracture of right patella, subsequent encounter for closed fracture with routine healing: Secondary | ICD-10-CM | POA: Diagnosis not present

## 2017-12-08 DIAGNOSIS — J45909 Unspecified asthma, uncomplicated: Secondary | ICD-10-CM | POA: Diagnosis not present

## 2017-12-08 DIAGNOSIS — I509 Heart failure, unspecified: Secondary | ICD-10-CM | POA: Diagnosis not present

## 2017-12-08 DIAGNOSIS — N183 Chronic kidney disease, stage 3 (moderate): Secondary | ICD-10-CM | POA: Diagnosis not present

## 2017-12-08 DIAGNOSIS — I442 Atrioventricular block, complete: Secondary | ICD-10-CM | POA: Diagnosis not present

## 2017-12-08 DIAGNOSIS — R69 Illness, unspecified: Secondary | ICD-10-CM | POA: Diagnosis not present

## 2017-12-08 DIAGNOSIS — S50812D Abrasion of left forearm, subsequent encounter: Secondary | ICD-10-CM | POA: Diagnosis not present

## 2017-12-12 DIAGNOSIS — I509 Heart failure, unspecified: Secondary | ICD-10-CM | POA: Diagnosis not present

## 2017-12-12 DIAGNOSIS — R69 Illness, unspecified: Secondary | ICD-10-CM | POA: Diagnosis not present

## 2017-12-12 DIAGNOSIS — S82001D Unspecified fracture of right patella, subsequent encounter for closed fracture with routine healing: Secondary | ICD-10-CM | POA: Diagnosis not present

## 2017-12-12 DIAGNOSIS — S50812D Abrasion of left forearm, subsequent encounter: Secondary | ICD-10-CM | POA: Diagnosis not present

## 2017-12-12 DIAGNOSIS — J45909 Unspecified asthma, uncomplicated: Secondary | ICD-10-CM | POA: Diagnosis not present

## 2017-12-12 DIAGNOSIS — I130 Hypertensive heart and chronic kidney disease with heart failure and stage 1 through stage 4 chronic kidney disease, or unspecified chronic kidney disease: Secondary | ICD-10-CM | POA: Diagnosis not present

## 2017-12-12 DIAGNOSIS — C9000 Multiple myeloma not having achieved remission: Secondary | ICD-10-CM | POA: Diagnosis not present

## 2017-12-12 DIAGNOSIS — I2510 Atherosclerotic heart disease of native coronary artery without angina pectoris: Secondary | ICD-10-CM | POA: Diagnosis not present

## 2017-12-12 DIAGNOSIS — I442 Atrioventricular block, complete: Secondary | ICD-10-CM | POA: Diagnosis not present

## 2017-12-12 DIAGNOSIS — N183 Chronic kidney disease, stage 3 (moderate): Secondary | ICD-10-CM | POA: Diagnosis not present

## 2017-12-13 DIAGNOSIS — R69 Illness, unspecified: Secondary | ICD-10-CM | POA: Diagnosis not present

## 2017-12-13 DIAGNOSIS — C9000 Multiple myeloma not having achieved remission: Secondary | ICD-10-CM | POA: Diagnosis not present

## 2017-12-13 DIAGNOSIS — S50812D Abrasion of left forearm, subsequent encounter: Secondary | ICD-10-CM | POA: Diagnosis not present

## 2017-12-13 DIAGNOSIS — I2510 Atherosclerotic heart disease of native coronary artery without angina pectoris: Secondary | ICD-10-CM | POA: Diagnosis not present

## 2017-12-13 DIAGNOSIS — I130 Hypertensive heart and chronic kidney disease with heart failure and stage 1 through stage 4 chronic kidney disease, or unspecified chronic kidney disease: Secondary | ICD-10-CM | POA: Diagnosis not present

## 2017-12-13 DIAGNOSIS — S82001D Unspecified fracture of right patella, subsequent encounter for closed fracture with routine healing: Secondary | ICD-10-CM | POA: Diagnosis not present

## 2017-12-13 DIAGNOSIS — N183 Chronic kidney disease, stage 3 (moderate): Secondary | ICD-10-CM | POA: Diagnosis not present

## 2017-12-13 DIAGNOSIS — I509 Heart failure, unspecified: Secondary | ICD-10-CM | POA: Diagnosis not present

## 2017-12-13 DIAGNOSIS — I442 Atrioventricular block, complete: Secondary | ICD-10-CM | POA: Diagnosis not present

## 2017-12-13 DIAGNOSIS — J45909 Unspecified asthma, uncomplicated: Secondary | ICD-10-CM | POA: Diagnosis not present

## 2017-12-15 DIAGNOSIS — S82001D Unspecified fracture of right patella, subsequent encounter for closed fracture with routine healing: Secondary | ICD-10-CM | POA: Diagnosis not present

## 2017-12-15 DIAGNOSIS — J45909 Unspecified asthma, uncomplicated: Secondary | ICD-10-CM | POA: Diagnosis not present

## 2017-12-15 DIAGNOSIS — I2510 Atherosclerotic heart disease of native coronary artery without angina pectoris: Secondary | ICD-10-CM | POA: Diagnosis not present

## 2017-12-15 DIAGNOSIS — R69 Illness, unspecified: Secondary | ICD-10-CM | POA: Diagnosis not present

## 2017-12-15 DIAGNOSIS — C9000 Multiple myeloma not having achieved remission: Secondary | ICD-10-CM | POA: Diagnosis not present

## 2017-12-15 DIAGNOSIS — I509 Heart failure, unspecified: Secondary | ICD-10-CM | POA: Diagnosis not present

## 2017-12-15 DIAGNOSIS — N183 Chronic kidney disease, stage 3 (moderate): Secondary | ICD-10-CM | POA: Diagnosis not present

## 2017-12-15 DIAGNOSIS — S50812D Abrasion of left forearm, subsequent encounter: Secondary | ICD-10-CM | POA: Diagnosis not present

## 2017-12-15 DIAGNOSIS — I130 Hypertensive heart and chronic kidney disease with heart failure and stage 1 through stage 4 chronic kidney disease, or unspecified chronic kidney disease: Secondary | ICD-10-CM | POA: Diagnosis not present

## 2017-12-15 DIAGNOSIS — I442 Atrioventricular block, complete: Secondary | ICD-10-CM | POA: Diagnosis not present

## 2017-12-19 DIAGNOSIS — I509 Heart failure, unspecified: Secondary | ICD-10-CM | POA: Diagnosis not present

## 2017-12-19 DIAGNOSIS — J45909 Unspecified asthma, uncomplicated: Secondary | ICD-10-CM | POA: Diagnosis not present

## 2017-12-19 DIAGNOSIS — N183 Chronic kidney disease, stage 3 (moderate): Secondary | ICD-10-CM | POA: Diagnosis not present

## 2017-12-19 DIAGNOSIS — R69 Illness, unspecified: Secondary | ICD-10-CM | POA: Diagnosis not present

## 2017-12-19 DIAGNOSIS — S50812D Abrasion of left forearm, subsequent encounter: Secondary | ICD-10-CM | POA: Diagnosis not present

## 2017-12-19 DIAGNOSIS — I442 Atrioventricular block, complete: Secondary | ICD-10-CM | POA: Diagnosis not present

## 2017-12-19 DIAGNOSIS — I2510 Atherosclerotic heart disease of native coronary artery without angina pectoris: Secondary | ICD-10-CM | POA: Diagnosis not present

## 2017-12-19 DIAGNOSIS — I130 Hypertensive heart and chronic kidney disease with heart failure and stage 1 through stage 4 chronic kidney disease, or unspecified chronic kidney disease: Secondary | ICD-10-CM | POA: Diagnosis not present

## 2017-12-19 DIAGNOSIS — C9000 Multiple myeloma not having achieved remission: Secondary | ICD-10-CM | POA: Diagnosis not present

## 2017-12-19 DIAGNOSIS — S82001D Unspecified fracture of right patella, subsequent encounter for closed fracture with routine healing: Secondary | ICD-10-CM | POA: Diagnosis not present

## 2017-12-20 DIAGNOSIS — I509 Heart failure, unspecified: Secondary | ICD-10-CM | POA: Diagnosis not present

## 2017-12-20 DIAGNOSIS — I2510 Atherosclerotic heart disease of native coronary artery without angina pectoris: Secondary | ICD-10-CM | POA: Diagnosis not present

## 2017-12-20 DIAGNOSIS — R69 Illness, unspecified: Secondary | ICD-10-CM | POA: Diagnosis not present

## 2017-12-20 DIAGNOSIS — S50812D Abrasion of left forearm, subsequent encounter: Secondary | ICD-10-CM | POA: Diagnosis not present

## 2017-12-20 DIAGNOSIS — J45909 Unspecified asthma, uncomplicated: Secondary | ICD-10-CM | POA: Diagnosis not present

## 2017-12-20 DIAGNOSIS — C9000 Multiple myeloma not having achieved remission: Secondary | ICD-10-CM | POA: Diagnosis not present

## 2017-12-20 DIAGNOSIS — N183 Chronic kidney disease, stage 3 (moderate): Secondary | ICD-10-CM | POA: Diagnosis not present

## 2017-12-20 DIAGNOSIS — I442 Atrioventricular block, complete: Secondary | ICD-10-CM | POA: Diagnosis not present

## 2017-12-20 DIAGNOSIS — S82001D Unspecified fracture of right patella, subsequent encounter for closed fracture with routine healing: Secondary | ICD-10-CM | POA: Diagnosis not present

## 2017-12-20 DIAGNOSIS — I130 Hypertensive heart and chronic kidney disease with heart failure and stage 1 through stage 4 chronic kidney disease, or unspecified chronic kidney disease: Secondary | ICD-10-CM | POA: Diagnosis not present

## 2017-12-21 DIAGNOSIS — I509 Heart failure, unspecified: Secondary | ICD-10-CM | POA: Diagnosis not present

## 2017-12-21 DIAGNOSIS — I442 Atrioventricular block, complete: Secondary | ICD-10-CM | POA: Diagnosis not present

## 2017-12-21 DIAGNOSIS — N183 Chronic kidney disease, stage 3 (moderate): Secondary | ICD-10-CM | POA: Diagnosis not present

## 2017-12-21 DIAGNOSIS — I10 Essential (primary) hypertension: Secondary | ICD-10-CM | POA: Diagnosis not present

## 2017-12-21 DIAGNOSIS — R69 Illness, unspecified: Secondary | ICD-10-CM | POA: Diagnosis not present

## 2017-12-21 DIAGNOSIS — J45909 Unspecified asthma, uncomplicated: Secondary | ICD-10-CM | POA: Diagnosis not present

## 2017-12-21 DIAGNOSIS — E782 Mixed hyperlipidemia: Secondary | ICD-10-CM | POA: Diagnosis not present

## 2017-12-21 DIAGNOSIS — I130 Hypertensive heart and chronic kidney disease with heart failure and stage 1 through stage 4 chronic kidney disease, or unspecified chronic kidney disease: Secondary | ICD-10-CM | POA: Diagnosis not present

## 2017-12-21 DIAGNOSIS — C9000 Multiple myeloma not having achieved remission: Secondary | ICD-10-CM | POA: Diagnosis not present

## 2017-12-21 DIAGNOSIS — I2510 Atherosclerotic heart disease of native coronary artery without angina pectoris: Secondary | ICD-10-CM | POA: Diagnosis not present

## 2017-12-21 DIAGNOSIS — S50812D Abrasion of left forearm, subsequent encounter: Secondary | ICD-10-CM | POA: Diagnosis not present

## 2017-12-21 DIAGNOSIS — S82001D Unspecified fracture of right patella, subsequent encounter for closed fracture with routine healing: Secondary | ICD-10-CM | POA: Diagnosis not present

## 2017-12-22 DIAGNOSIS — I272 Pulmonary hypertension, unspecified: Secondary | ICD-10-CM | POA: Diagnosis not present

## 2017-12-22 DIAGNOSIS — I2510 Atherosclerotic heart disease of native coronary artery without angina pectoris: Secondary | ICD-10-CM | POA: Diagnosis not present

## 2017-12-22 DIAGNOSIS — R69 Illness, unspecified: Secondary | ICD-10-CM | POA: Diagnosis not present

## 2017-12-22 DIAGNOSIS — I130 Hypertensive heart and chronic kidney disease with heart failure and stage 1 through stage 4 chronic kidney disease, or unspecified chronic kidney disease: Secondary | ICD-10-CM | POA: Diagnosis not present

## 2017-12-22 DIAGNOSIS — C9000 Multiple myeloma not having achieved remission: Secondary | ICD-10-CM | POA: Diagnosis not present

## 2017-12-22 DIAGNOSIS — J45909 Unspecified asthma, uncomplicated: Secondary | ICD-10-CM | POA: Diagnosis not present

## 2017-12-22 DIAGNOSIS — I509 Heart failure, unspecified: Secondary | ICD-10-CM | POA: Diagnosis not present

## 2017-12-22 DIAGNOSIS — S51812D Laceration without foreign body of left forearm, subsequent encounter: Secondary | ICD-10-CM | POA: Diagnosis not present

## 2017-12-22 DIAGNOSIS — I442 Atrioventricular block, complete: Secondary | ICD-10-CM | POA: Diagnosis not present

## 2017-12-22 DIAGNOSIS — N183 Chronic kidney disease, stage 3 (moderate): Secondary | ICD-10-CM | POA: Diagnosis not present

## 2017-12-27 DIAGNOSIS — I442 Atrioventricular block, complete: Secondary | ICD-10-CM | POA: Diagnosis not present

## 2017-12-27 DIAGNOSIS — I509 Heart failure, unspecified: Secondary | ICD-10-CM | POA: Diagnosis not present

## 2017-12-27 DIAGNOSIS — S51812D Laceration without foreign body of left forearm, subsequent encounter: Secondary | ICD-10-CM | POA: Diagnosis not present

## 2017-12-27 DIAGNOSIS — J45909 Unspecified asthma, uncomplicated: Secondary | ICD-10-CM | POA: Diagnosis not present

## 2017-12-27 DIAGNOSIS — C9000 Multiple myeloma not having achieved remission: Secondary | ICD-10-CM | POA: Diagnosis not present

## 2017-12-27 DIAGNOSIS — N183 Chronic kidney disease, stage 3 (moderate): Secondary | ICD-10-CM | POA: Diagnosis not present

## 2017-12-27 DIAGNOSIS — R69 Illness, unspecified: Secondary | ICD-10-CM | POA: Diagnosis not present

## 2017-12-27 DIAGNOSIS — I272 Pulmonary hypertension, unspecified: Secondary | ICD-10-CM | POA: Diagnosis not present

## 2017-12-27 DIAGNOSIS — I2510 Atherosclerotic heart disease of native coronary artery without angina pectoris: Secondary | ICD-10-CM | POA: Diagnosis not present

## 2017-12-27 DIAGNOSIS — I130 Hypertensive heart and chronic kidney disease with heart failure and stage 1 through stage 4 chronic kidney disease, or unspecified chronic kidney disease: Secondary | ICD-10-CM | POA: Diagnosis not present

## 2017-12-28 DIAGNOSIS — R69 Illness, unspecified: Secondary | ICD-10-CM | POA: Diagnosis not present

## 2017-12-28 DIAGNOSIS — I442 Atrioventricular block, complete: Secondary | ICD-10-CM | POA: Diagnosis not present

## 2017-12-28 DIAGNOSIS — C9000 Multiple myeloma not having achieved remission: Secondary | ICD-10-CM | POA: Diagnosis not present

## 2017-12-28 DIAGNOSIS — S51812D Laceration without foreign body of left forearm, subsequent encounter: Secondary | ICD-10-CM | POA: Diagnosis not present

## 2017-12-28 DIAGNOSIS — I509 Heart failure, unspecified: Secondary | ICD-10-CM | POA: Diagnosis not present

## 2017-12-28 DIAGNOSIS — N183 Chronic kidney disease, stage 3 (moderate): Secondary | ICD-10-CM | POA: Diagnosis not present

## 2017-12-28 DIAGNOSIS — J45909 Unspecified asthma, uncomplicated: Secondary | ICD-10-CM | POA: Diagnosis not present

## 2017-12-28 DIAGNOSIS — I272 Pulmonary hypertension, unspecified: Secondary | ICD-10-CM | POA: Diagnosis not present

## 2017-12-28 DIAGNOSIS — I2510 Atherosclerotic heart disease of native coronary artery without angina pectoris: Secondary | ICD-10-CM | POA: Diagnosis not present

## 2017-12-28 DIAGNOSIS — I130 Hypertensive heart and chronic kidney disease with heart failure and stage 1 through stage 4 chronic kidney disease, or unspecified chronic kidney disease: Secondary | ICD-10-CM | POA: Diagnosis not present

## 2017-12-29 DIAGNOSIS — I2510 Atherosclerotic heart disease of native coronary artery without angina pectoris: Secondary | ICD-10-CM | POA: Diagnosis not present

## 2017-12-29 DIAGNOSIS — I509 Heart failure, unspecified: Secondary | ICD-10-CM | POA: Diagnosis not present

## 2017-12-29 DIAGNOSIS — I272 Pulmonary hypertension, unspecified: Secondary | ICD-10-CM | POA: Diagnosis not present

## 2017-12-29 DIAGNOSIS — S51812D Laceration without foreign body of left forearm, subsequent encounter: Secondary | ICD-10-CM | POA: Diagnosis not present

## 2017-12-29 DIAGNOSIS — J45909 Unspecified asthma, uncomplicated: Secondary | ICD-10-CM | POA: Diagnosis not present

## 2017-12-29 DIAGNOSIS — N183 Chronic kidney disease, stage 3 (moderate): Secondary | ICD-10-CM | POA: Diagnosis not present

## 2017-12-29 DIAGNOSIS — I130 Hypertensive heart and chronic kidney disease with heart failure and stage 1 through stage 4 chronic kidney disease, or unspecified chronic kidney disease: Secondary | ICD-10-CM | POA: Diagnosis not present

## 2017-12-29 DIAGNOSIS — C9000 Multiple myeloma not having achieved remission: Secondary | ICD-10-CM | POA: Diagnosis not present

## 2017-12-29 DIAGNOSIS — R69 Illness, unspecified: Secondary | ICD-10-CM | POA: Diagnosis not present

## 2017-12-29 DIAGNOSIS — I442 Atrioventricular block, complete: Secondary | ICD-10-CM | POA: Diagnosis not present

## 2017-12-30 DIAGNOSIS — R69 Illness, unspecified: Secondary | ICD-10-CM | POA: Diagnosis not present

## 2017-12-30 DIAGNOSIS — I130 Hypertensive heart and chronic kidney disease with heart failure and stage 1 through stage 4 chronic kidney disease, or unspecified chronic kidney disease: Secondary | ICD-10-CM | POA: Diagnosis not present

## 2017-12-30 DIAGNOSIS — S51812D Laceration without foreign body of left forearm, subsequent encounter: Secondary | ICD-10-CM | POA: Diagnosis not present

## 2017-12-30 DIAGNOSIS — I442 Atrioventricular block, complete: Secondary | ICD-10-CM | POA: Diagnosis not present

## 2018-01-02 DIAGNOSIS — C9000 Multiple myeloma not having achieved remission: Secondary | ICD-10-CM | POA: Diagnosis not present

## 2018-01-02 DIAGNOSIS — I130 Hypertensive heart and chronic kidney disease with heart failure and stage 1 through stage 4 chronic kidney disease, or unspecified chronic kidney disease: Secondary | ICD-10-CM | POA: Diagnosis not present

## 2018-01-02 DIAGNOSIS — I272 Pulmonary hypertension, unspecified: Secondary | ICD-10-CM | POA: Diagnosis not present

## 2018-01-02 DIAGNOSIS — N183 Chronic kidney disease, stage 3 (moderate): Secondary | ICD-10-CM | POA: Diagnosis not present

## 2018-01-02 DIAGNOSIS — I2510 Atherosclerotic heart disease of native coronary artery without angina pectoris: Secondary | ICD-10-CM | POA: Diagnosis not present

## 2018-01-02 DIAGNOSIS — I442 Atrioventricular block, complete: Secondary | ICD-10-CM | POA: Diagnosis not present

## 2018-01-02 DIAGNOSIS — J45909 Unspecified asthma, uncomplicated: Secondary | ICD-10-CM | POA: Diagnosis not present

## 2018-01-02 DIAGNOSIS — S51812D Laceration without foreign body of left forearm, subsequent encounter: Secondary | ICD-10-CM | POA: Diagnosis not present

## 2018-01-02 DIAGNOSIS — I509 Heart failure, unspecified: Secondary | ICD-10-CM | POA: Diagnosis not present

## 2018-01-02 DIAGNOSIS — R69 Illness, unspecified: Secondary | ICD-10-CM | POA: Diagnosis not present

## 2018-01-03 DIAGNOSIS — I2510 Atherosclerotic heart disease of native coronary artery without angina pectoris: Secondary | ICD-10-CM | POA: Diagnosis not present

## 2018-01-03 DIAGNOSIS — I272 Pulmonary hypertension, unspecified: Secondary | ICD-10-CM | POA: Diagnosis not present

## 2018-01-03 DIAGNOSIS — I130 Hypertensive heart and chronic kidney disease with heart failure and stage 1 through stage 4 chronic kidney disease, or unspecified chronic kidney disease: Secondary | ICD-10-CM | POA: Diagnosis not present

## 2018-01-03 DIAGNOSIS — I509 Heart failure, unspecified: Secondary | ICD-10-CM | POA: Diagnosis not present

## 2018-01-03 DIAGNOSIS — C9000 Multiple myeloma not having achieved remission: Secondary | ICD-10-CM | POA: Diagnosis not present

## 2018-01-03 DIAGNOSIS — R69 Illness, unspecified: Secondary | ICD-10-CM | POA: Diagnosis not present

## 2018-01-03 DIAGNOSIS — N183 Chronic kidney disease, stage 3 (moderate): Secondary | ICD-10-CM | POA: Diagnosis not present

## 2018-01-03 DIAGNOSIS — J45909 Unspecified asthma, uncomplicated: Secondary | ICD-10-CM | POA: Diagnosis not present

## 2018-01-03 DIAGNOSIS — S51812D Laceration without foreign body of left forearm, subsequent encounter: Secondary | ICD-10-CM | POA: Diagnosis not present

## 2018-01-03 DIAGNOSIS — I442 Atrioventricular block, complete: Secondary | ICD-10-CM | POA: Diagnosis not present

## 2018-01-04 DIAGNOSIS — I509 Heart failure, unspecified: Secondary | ICD-10-CM | POA: Diagnosis not present

## 2018-01-04 DIAGNOSIS — C9000 Multiple myeloma not having achieved remission: Secondary | ICD-10-CM | POA: Diagnosis not present

## 2018-01-04 DIAGNOSIS — I2510 Atherosclerotic heart disease of native coronary artery without angina pectoris: Secondary | ICD-10-CM | POA: Diagnosis not present

## 2018-01-04 DIAGNOSIS — I442 Atrioventricular block, complete: Secondary | ICD-10-CM | POA: Diagnosis not present

## 2018-01-04 DIAGNOSIS — I272 Pulmonary hypertension, unspecified: Secondary | ICD-10-CM | POA: Diagnosis not present

## 2018-01-04 DIAGNOSIS — N183 Chronic kidney disease, stage 3 (moderate): Secondary | ICD-10-CM | POA: Diagnosis not present

## 2018-01-04 DIAGNOSIS — S51812D Laceration without foreign body of left forearm, subsequent encounter: Secondary | ICD-10-CM | POA: Diagnosis not present

## 2018-01-04 DIAGNOSIS — J45909 Unspecified asthma, uncomplicated: Secondary | ICD-10-CM | POA: Diagnosis not present

## 2018-01-04 DIAGNOSIS — R69 Illness, unspecified: Secondary | ICD-10-CM | POA: Diagnosis not present

## 2018-01-04 DIAGNOSIS — I130 Hypertensive heart and chronic kidney disease with heart failure and stage 1 through stage 4 chronic kidney disease, or unspecified chronic kidney disease: Secondary | ICD-10-CM | POA: Diagnosis not present

## 2018-01-05 DIAGNOSIS — J45909 Unspecified asthma, uncomplicated: Secondary | ICD-10-CM | POA: Diagnosis not present

## 2018-01-05 DIAGNOSIS — I442 Atrioventricular block, complete: Secondary | ICD-10-CM | POA: Diagnosis not present

## 2018-01-05 DIAGNOSIS — N183 Chronic kidney disease, stage 3 (moderate): Secondary | ICD-10-CM | POA: Diagnosis not present

## 2018-01-05 DIAGNOSIS — S51812D Laceration without foreign body of left forearm, subsequent encounter: Secondary | ICD-10-CM | POA: Diagnosis not present

## 2018-01-05 DIAGNOSIS — I272 Pulmonary hypertension, unspecified: Secondary | ICD-10-CM | POA: Diagnosis not present

## 2018-01-05 DIAGNOSIS — I2510 Atherosclerotic heart disease of native coronary artery without angina pectoris: Secondary | ICD-10-CM | POA: Diagnosis not present

## 2018-01-05 DIAGNOSIS — I509 Heart failure, unspecified: Secondary | ICD-10-CM | POA: Diagnosis not present

## 2018-01-05 DIAGNOSIS — R69 Illness, unspecified: Secondary | ICD-10-CM | POA: Diagnosis not present

## 2018-01-05 DIAGNOSIS — I130 Hypertensive heart and chronic kidney disease with heart failure and stage 1 through stage 4 chronic kidney disease, or unspecified chronic kidney disease: Secondary | ICD-10-CM | POA: Diagnosis not present

## 2018-01-05 DIAGNOSIS — C9000 Multiple myeloma not having achieved remission: Secondary | ICD-10-CM | POA: Diagnosis not present

## 2018-01-09 DIAGNOSIS — I442 Atrioventricular block, complete: Secondary | ICD-10-CM | POA: Diagnosis not present

## 2018-01-09 DIAGNOSIS — J45909 Unspecified asthma, uncomplicated: Secondary | ICD-10-CM | POA: Diagnosis not present

## 2018-01-09 DIAGNOSIS — I272 Pulmonary hypertension, unspecified: Secondary | ICD-10-CM | POA: Diagnosis not present

## 2018-01-09 DIAGNOSIS — C9000 Multiple myeloma not having achieved remission: Secondary | ICD-10-CM | POA: Diagnosis not present

## 2018-01-09 DIAGNOSIS — N183 Chronic kidney disease, stage 3 (moderate): Secondary | ICD-10-CM | POA: Diagnosis not present

## 2018-01-09 DIAGNOSIS — S51812D Laceration without foreign body of left forearm, subsequent encounter: Secondary | ICD-10-CM | POA: Diagnosis not present

## 2018-01-09 DIAGNOSIS — I2510 Atherosclerotic heart disease of native coronary artery without angina pectoris: Secondary | ICD-10-CM | POA: Diagnosis not present

## 2018-01-09 DIAGNOSIS — I509 Heart failure, unspecified: Secondary | ICD-10-CM | POA: Diagnosis not present

## 2018-01-09 DIAGNOSIS — I130 Hypertensive heart and chronic kidney disease with heart failure and stage 1 through stage 4 chronic kidney disease, or unspecified chronic kidney disease: Secondary | ICD-10-CM | POA: Diagnosis not present

## 2018-01-09 DIAGNOSIS — R69 Illness, unspecified: Secondary | ICD-10-CM | POA: Diagnosis not present

## 2018-01-10 DIAGNOSIS — C9000 Multiple myeloma not having achieved remission: Secondary | ICD-10-CM | POA: Diagnosis not present

## 2018-01-10 DIAGNOSIS — I130 Hypertensive heart and chronic kidney disease with heart failure and stage 1 through stage 4 chronic kidney disease, or unspecified chronic kidney disease: Secondary | ICD-10-CM | POA: Diagnosis not present

## 2018-01-10 DIAGNOSIS — S51812D Laceration without foreign body of left forearm, subsequent encounter: Secondary | ICD-10-CM | POA: Diagnosis not present

## 2018-01-10 DIAGNOSIS — I272 Pulmonary hypertension, unspecified: Secondary | ICD-10-CM | POA: Diagnosis not present

## 2018-01-10 DIAGNOSIS — I442 Atrioventricular block, complete: Secondary | ICD-10-CM | POA: Diagnosis not present

## 2018-01-10 DIAGNOSIS — R69 Illness, unspecified: Secondary | ICD-10-CM | POA: Diagnosis not present

## 2018-01-10 DIAGNOSIS — N183 Chronic kidney disease, stage 3 (moderate): Secondary | ICD-10-CM | POA: Diagnosis not present

## 2018-01-10 DIAGNOSIS — I509 Heart failure, unspecified: Secondary | ICD-10-CM | POA: Diagnosis not present

## 2018-01-10 DIAGNOSIS — J45909 Unspecified asthma, uncomplicated: Secondary | ICD-10-CM | POA: Diagnosis not present

## 2018-01-10 DIAGNOSIS — I2510 Atherosclerotic heart disease of native coronary artery without angina pectoris: Secondary | ICD-10-CM | POA: Diagnosis not present

## 2018-01-12 DIAGNOSIS — N183 Chronic kidney disease, stage 3 (moderate): Secondary | ICD-10-CM | POA: Diagnosis not present

## 2018-01-12 DIAGNOSIS — R69 Illness, unspecified: Secondary | ICD-10-CM | POA: Diagnosis not present

## 2018-01-12 DIAGNOSIS — J45909 Unspecified asthma, uncomplicated: Secondary | ICD-10-CM | POA: Diagnosis not present

## 2018-01-12 DIAGNOSIS — C9000 Multiple myeloma not having achieved remission: Secondary | ICD-10-CM | POA: Diagnosis not present

## 2018-01-12 DIAGNOSIS — I130 Hypertensive heart and chronic kidney disease with heart failure and stage 1 through stage 4 chronic kidney disease, or unspecified chronic kidney disease: Secondary | ICD-10-CM | POA: Diagnosis not present

## 2018-01-12 DIAGNOSIS — S51812D Laceration without foreign body of left forearm, subsequent encounter: Secondary | ICD-10-CM | POA: Diagnosis not present

## 2018-01-12 DIAGNOSIS — I442 Atrioventricular block, complete: Secondary | ICD-10-CM | POA: Diagnosis not present

## 2018-01-12 DIAGNOSIS — I2510 Atherosclerotic heart disease of native coronary artery without angina pectoris: Secondary | ICD-10-CM | POA: Diagnosis not present

## 2018-01-12 DIAGNOSIS — I509 Heart failure, unspecified: Secondary | ICD-10-CM | POA: Diagnosis not present

## 2018-01-12 DIAGNOSIS — I272 Pulmonary hypertension, unspecified: Secondary | ICD-10-CM | POA: Diagnosis not present

## 2018-01-16 DIAGNOSIS — R69 Illness, unspecified: Secondary | ICD-10-CM | POA: Diagnosis not present

## 2018-01-16 DIAGNOSIS — S51812D Laceration without foreign body of left forearm, subsequent encounter: Secondary | ICD-10-CM | POA: Diagnosis not present

## 2018-01-16 DIAGNOSIS — I442 Atrioventricular block, complete: Secondary | ICD-10-CM | POA: Diagnosis not present

## 2018-01-16 DIAGNOSIS — N183 Chronic kidney disease, stage 3 (moderate): Secondary | ICD-10-CM | POA: Diagnosis not present

## 2018-01-16 DIAGNOSIS — J45909 Unspecified asthma, uncomplicated: Secondary | ICD-10-CM | POA: Diagnosis not present

## 2018-01-16 DIAGNOSIS — I272 Pulmonary hypertension, unspecified: Secondary | ICD-10-CM | POA: Diagnosis not present

## 2018-01-16 DIAGNOSIS — I2510 Atherosclerotic heart disease of native coronary artery without angina pectoris: Secondary | ICD-10-CM | POA: Diagnosis not present

## 2018-01-16 DIAGNOSIS — I130 Hypertensive heart and chronic kidney disease with heart failure and stage 1 through stage 4 chronic kidney disease, or unspecified chronic kidney disease: Secondary | ICD-10-CM | POA: Diagnosis not present

## 2018-01-16 DIAGNOSIS — C9000 Multiple myeloma not having achieved remission: Secondary | ICD-10-CM | POA: Diagnosis not present

## 2018-01-16 DIAGNOSIS — I509 Heart failure, unspecified: Secondary | ICD-10-CM | POA: Diagnosis not present

## 2018-01-17 DIAGNOSIS — S51812D Laceration without foreign body of left forearm, subsequent encounter: Secondary | ICD-10-CM | POA: Diagnosis not present

## 2018-01-17 DIAGNOSIS — C9000 Multiple myeloma not having achieved remission: Secondary | ICD-10-CM | POA: Diagnosis not present

## 2018-01-17 DIAGNOSIS — I2510 Atherosclerotic heart disease of native coronary artery without angina pectoris: Secondary | ICD-10-CM | POA: Diagnosis not present

## 2018-01-17 DIAGNOSIS — I130 Hypertensive heart and chronic kidney disease with heart failure and stage 1 through stage 4 chronic kidney disease, or unspecified chronic kidney disease: Secondary | ICD-10-CM | POA: Diagnosis not present

## 2018-01-17 DIAGNOSIS — J45909 Unspecified asthma, uncomplicated: Secondary | ICD-10-CM | POA: Diagnosis not present

## 2018-01-17 DIAGNOSIS — R69 Illness, unspecified: Secondary | ICD-10-CM | POA: Diagnosis not present

## 2018-01-17 DIAGNOSIS — I442 Atrioventricular block, complete: Secondary | ICD-10-CM | POA: Diagnosis not present

## 2018-01-17 DIAGNOSIS — I272 Pulmonary hypertension, unspecified: Secondary | ICD-10-CM | POA: Diagnosis not present

## 2018-01-17 DIAGNOSIS — N183 Chronic kidney disease, stage 3 (moderate): Secondary | ICD-10-CM | POA: Diagnosis not present

## 2018-01-17 DIAGNOSIS — I509 Heart failure, unspecified: Secondary | ICD-10-CM | POA: Diagnosis not present

## 2018-01-19 DIAGNOSIS — I2510 Atherosclerotic heart disease of native coronary artery without angina pectoris: Secondary | ICD-10-CM | POA: Diagnosis not present

## 2018-01-19 DIAGNOSIS — I272 Pulmonary hypertension, unspecified: Secondary | ICD-10-CM | POA: Diagnosis not present

## 2018-01-19 DIAGNOSIS — R69 Illness, unspecified: Secondary | ICD-10-CM | POA: Diagnosis not present

## 2018-01-19 DIAGNOSIS — N183 Chronic kidney disease, stage 3 (moderate): Secondary | ICD-10-CM | POA: Diagnosis not present

## 2018-01-19 DIAGNOSIS — I509 Heart failure, unspecified: Secondary | ICD-10-CM | POA: Diagnosis not present

## 2018-01-19 DIAGNOSIS — I442 Atrioventricular block, complete: Secondary | ICD-10-CM | POA: Diagnosis not present

## 2018-01-19 DIAGNOSIS — S51812D Laceration without foreign body of left forearm, subsequent encounter: Secondary | ICD-10-CM | POA: Diagnosis not present

## 2018-01-19 DIAGNOSIS — J45909 Unspecified asthma, uncomplicated: Secondary | ICD-10-CM | POA: Diagnosis not present

## 2018-01-19 DIAGNOSIS — I130 Hypertensive heart and chronic kidney disease with heart failure and stage 1 through stage 4 chronic kidney disease, or unspecified chronic kidney disease: Secondary | ICD-10-CM | POA: Diagnosis not present

## 2018-01-19 DIAGNOSIS — C9000 Multiple myeloma not having achieved remission: Secondary | ICD-10-CM | POA: Diagnosis not present

## 2018-01-23 DIAGNOSIS — I10 Essential (primary) hypertension: Secondary | ICD-10-CM | POA: Diagnosis not present

## 2018-01-23 DIAGNOSIS — R0602 Shortness of breath: Secondary | ICD-10-CM | POA: Diagnosis not present

## 2018-01-23 DIAGNOSIS — I25119 Atherosclerotic heart disease of native coronary artery with unspecified angina pectoris: Secondary | ICD-10-CM | POA: Diagnosis not present

## 2018-01-23 DIAGNOSIS — I270 Primary pulmonary hypertension: Secondary | ICD-10-CM | POA: Diagnosis not present

## 2018-05-23 ENCOUNTER — Encounter (HOSPITAL_COMMUNITY): Payer: Self-pay | Admitting: Emergency Medicine

## 2018-05-23 ENCOUNTER — Emergency Department (HOSPITAL_COMMUNITY): Payer: Medicare Other

## 2018-05-23 ENCOUNTER — Other Ambulatory Visit: Payer: Self-pay

## 2018-05-23 ENCOUNTER — Inpatient Hospital Stay (HOSPITAL_COMMUNITY)
Admission: EM | Admit: 2018-05-23 | Discharge: 2018-05-26 | DRG: 243 | Disposition: A | Discharge status: Home Health | Payer: Medicare Other | Attending: Internal Medicine | Admitting: Internal Medicine

## 2018-05-23 DIAGNOSIS — Z885 Allergy status to narcotic agent status: Secondary | ICD-10-CM

## 2018-05-23 DIAGNOSIS — I442 Atrioventricular block, complete: Principal | ICD-10-CM | POA: Diagnosis present

## 2018-05-23 DIAGNOSIS — I495 Sick sinus syndrome: Secondary | ICD-10-CM | POA: Diagnosis present

## 2018-05-23 DIAGNOSIS — D631 Anemia in chronic kidney disease: Secondary | ICD-10-CM | POA: Diagnosis present

## 2018-05-23 DIAGNOSIS — I5032 Chronic diastolic (congestive) heart failure: Secondary | ICD-10-CM | POA: Diagnosis present

## 2018-05-23 DIAGNOSIS — D649 Anemia, unspecified: Secondary | ICD-10-CM

## 2018-05-23 DIAGNOSIS — Z66 Do not resuscitate: Secondary | ICD-10-CM | POA: Diagnosis present

## 2018-05-23 DIAGNOSIS — E875 Hyperkalemia: Secondary | ICD-10-CM | POA: Diagnosis present

## 2018-05-23 DIAGNOSIS — Z853 Personal history of malignant neoplasm of breast: Secondary | ICD-10-CM

## 2018-05-23 DIAGNOSIS — Z95818 Presence of other cardiac implants and grafts: Secondary | ICD-10-CM

## 2018-05-23 DIAGNOSIS — I130 Hypertensive heart and chronic kidney disease with heart failure and stage 1 through stage 4 chronic kidney disease, or unspecified chronic kidney disease: Secondary | ICD-10-CM | POA: Diagnosis present

## 2018-05-23 DIAGNOSIS — Z7951 Long term (current) use of inhaled steroids: Secondary | ICD-10-CM

## 2018-05-23 DIAGNOSIS — I2722 Pulmonary hypertension due to left heart disease: Secondary | ICD-10-CM | POA: Diagnosis present

## 2018-05-23 DIAGNOSIS — Z880 Allergy status to penicillin: Secondary | ICD-10-CM

## 2018-05-23 DIAGNOSIS — Z955 Presence of coronary angioplasty implant and graft: Secondary | ICD-10-CM

## 2018-05-23 DIAGNOSIS — I441 Atrioventricular block, second degree: Secondary | ICD-10-CM

## 2018-05-23 DIAGNOSIS — J45909 Unspecified asthma, uncomplicated: Secondary | ICD-10-CM | POA: Diagnosis present

## 2018-05-23 DIAGNOSIS — N183 Chronic kidney disease, stage 3 (moderate): Secondary | ICD-10-CM | POA: Diagnosis present

## 2018-05-23 DIAGNOSIS — I2510 Atherosclerotic heart disease of native coronary artery without angina pectoris: Secondary | ICD-10-CM | POA: Diagnosis present

## 2018-05-23 DIAGNOSIS — Z9011 Acquired absence of right breast and nipple: Secondary | ICD-10-CM

## 2018-05-23 DIAGNOSIS — W19XXXA Unspecified fall, initial encounter: Secondary | ICD-10-CM

## 2018-05-23 DIAGNOSIS — N289 Disorder of kidney and ureter, unspecified: Secondary | ICD-10-CM

## 2018-05-23 DIAGNOSIS — R42 Dizziness and giddiness: Secondary | ICD-10-CM | POA: Diagnosis not present

## 2018-05-23 DIAGNOSIS — Z9861 Coronary angioplasty status: Secondary | ICD-10-CM

## 2018-05-23 LAB — CBC WITH DIFFERENTIAL/PLATELET
Abs Immature Granulocytes: 0.06 10*3/uL (ref 0.00–0.07)
BASOS ABS: 0 10*3/uL (ref 0.0–0.1)
BASOS PCT: 0 %
Eosinophils Absolute: 0.2 10*3/uL (ref 0.0–0.5)
Eosinophils Relative: 2 %
HCT: 35.7 % — ABNORMAL LOW (ref 36.0–46.0)
HEMOGLOBIN: 11 g/dL — AB (ref 12.0–15.0)
IMMATURE GRANULOCYTES: 1 %
LYMPHS PCT: 15 %
Lymphs Abs: 1.3 10*3/uL (ref 0.7–4.0)
MCH: 30.8 pg (ref 26.0–34.0)
MCHC: 30.8 g/dL (ref 30.0–36.0)
MCV: 100 fL (ref 80.0–100.0)
MONO ABS: 0.7 10*3/uL (ref 0.1–1.0)
Monocytes Relative: 9 %
NEUTROS ABS: 6.1 10*3/uL (ref 1.7–7.7)
Neutrophils Relative %: 73 %
PLATELETS: 160 10*3/uL (ref 150–400)
RBC: 3.57 MIL/uL — AB (ref 3.87–5.11)
RDW: 13.7 % (ref 11.5–15.5)
WBC: 8.3 10*3/uL (ref 4.0–10.5)
nRBC: 0 % (ref 0.0–0.2)
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
## 2018-05-23 NOTE — ED Triage Notes (Signed)
[facility]- pt from home. Pt complained of chest pain this evening. EMS reports pts having PVC's x7beats. Pt is asymptomatic at this time. Pt is A&O x4. Pt is also SOB. Pts HR has fluctuated from 38-73 bpms. Pt is a DNR pt. pts cardiologist have told family there isn't much they can do to help at this time.

## 2018-05-24 ENCOUNTER — Observation Stay (HOSPITAL_COMMUNITY): Payer: Medicare Other

## 2018-05-24 ENCOUNTER — Encounter (HOSPITAL_COMMUNITY): Payer: Self-pay | Admitting: Internal Medicine

## 2018-05-24 DIAGNOSIS — I441 Atrioventricular block, second degree: Secondary | ICD-10-CM

## 2018-05-24 DIAGNOSIS — R42 Dizziness and giddiness: Secondary | ICD-10-CM

## 2018-05-24 DIAGNOSIS — I442 Atrioventricular block, complete: Secondary | ICD-10-CM | POA: Diagnosis not present

## 2018-05-24 DIAGNOSIS — D649 Anemia, unspecified: Secondary | ICD-10-CM

## 2018-05-24 LAB — CBC
HCT: 34.9 % — ABNORMAL LOW (ref 36.0–46.0)
HCT: 32.1 % — ABNORMAL LOW (ref 36.0–46.0)
Hemoglobin: 11 g/dL — ABNORMAL LOW (ref 12.0–15.0)
Hemoglobin: 10.2 g/dL — ABNORMAL LOW (ref 12.0–15.0)
MCH: 31.5 pg (ref 26.0–34.0)
MCH: 31.5 pg (ref 26.0–34.0)
MCHC: 31.5 g/dL (ref 30.0–36.0)
MCHC: 31.8 g/dL (ref 30.0–36.0)
MCV: 100 fL (ref 80.0–100.0)
MCV: 99.1 fL (ref 80.0–100.0)
NRBC: 0 % (ref 0.0–0.2)
Platelets: 151 10*3/uL (ref 150–400)
Platelets: 153 10*3/uL (ref 150–400)
RBC: 3.49 MIL/uL — AB (ref 3.87–5.11)
RBC: 3.24 MIL/uL — ABNORMAL LOW (ref 3.87–5.11)
RDW: 13.8 % (ref 11.5–15.5)
RDW: 13.6 % (ref 11.5–15.5)
WBC: 9 10*3/uL (ref 4.0–10.5)
WBC: 6.8 10*3/uL (ref 4.0–10.5)
nRBC: 0 % (ref 0.0–0.2)

## 2018-05-24 LAB — COMPREHENSIVE METABOLIC PANEL
ALT: 13 U/L (ref 0–44)
ANION GAP: 12 (ref 5–15)
AST: 20 U/L (ref 15–41)
Albumin: 3.2 g/dL — ABNORMAL LOW (ref 3.5–5.0)
Alkaline Phosphatase: 60 U/L (ref 38–126)
BUN: 26 mg/dL — ABNORMAL HIGH (ref 8–23)
CALCIUM: 9.3 mg/dL (ref 8.9–10.3)
CHLORIDE: 108 mmol/L (ref 98–111)
CO2: 23 mmol/L (ref 22–32)
CREATININE: 1.24 mg/dL — AB (ref 0.44–1.00)
GFR calc non Af Amer: 38 mL/min — ABNORMAL LOW (ref >60)
GFR, EST AFRICAN AMERICAN: 44 mL/min — AB (ref >60)
Glucose, Bld: 104 mg/dL — ABNORMAL HIGH (ref 70–99)
Potassium: 4.7 mmol/L (ref 3.5–5.1)
SODIUM: 143 mmol/L (ref 135–145)
Total Bilirubin: 1 mg/dL (ref 0.3–1.2)
Total Protein: 5.9 g/dL — ABNORMAL LOW (ref 6.5–8.1)

## 2018-05-24 LAB — BASIC METABOLIC PANEL
ANION GAP: 9 (ref 5–15)
BUN: 25 mg/dL — ABNORMAL HIGH (ref 8–23)
CO2: 19 mmol/L — ABNORMAL LOW (ref 22–32)
Calcium: 8.5 mg/dL — ABNORMAL LOW (ref 8.9–10.3)
Chloride: 114 mmol/L — ABNORMAL HIGH (ref 98–111)
Creatinine, Ser: 1.13 mg/dL — ABNORMAL HIGH (ref 0.44–1.00)
GFR calc Af Amer: 49 mL/min — ABNORMAL LOW (ref >60)
GFR calc non Af Amer: 42 mL/min — ABNORMAL LOW (ref >60)
Glucose, Bld: 82 mg/dL (ref 70–99)
Potassium: 4.5 mmol/L (ref 3.5–5.1)
Sodium: 142 mmol/L (ref 135–145)

## 2018-05-24 LAB — CREATININE, SERUM
CREATININE: 1.34 mg/dL — AB (ref 0.44–1.00)
GFR calc Af Amer: 40 mL/min — ABNORMAL LOW (ref >60)
GFR calc non Af Amer: 35 mL/min — ABNORMAL LOW (ref >60)

## 2018-05-24 LAB — TROPONIN I
Troponin I: <0.03 ng/mL (ref <0.03)
Troponin I: <0.03 ng/mL (ref <0.03)
Troponin I: <0.03 ng/mL (ref <0.03)

## 2018-05-24 LAB — MAGNESIUM: Magnesium: 1.9 mg/dL (ref 1.7–2.4)

## 2018-05-24 LAB — TSH: TSH: 2.06 u[IU]/mL (ref 0.350–4.500)

## 2018-05-24 MED ORDER — ALBUTEROL SULFATE (2.5 MG/3ML) 0.083% IN NEBU: 3.0000 mL | INHALATION_SOLUTION | RESPIRATORY_TRACT | Status: DC | PRN

## 2018-05-24 MED ORDER — FESOTERODINE FUMARATE ER 4 MG PO TB24
4.0000 mg | ORAL_TABLET | Freq: Every day | ORAL | Status: DC
  Administered 2018-05-24 – 2018-05-26 (×3): 4 mg via ORAL
  Filled 2018-05-24 (×3): qty 1

## 2018-05-24 MED ORDER — ACETAMINOPHEN 650 MG RE SUPP: 650.0000 mg | Freq: Four times a day (QID) | RECTAL | Status: DC | PRN

## 2018-05-24 MED ORDER — PRAVASTATIN SODIUM 40 MG PO TABS
40.0000 mg | ORAL_TABLET | Freq: Every day | ORAL | Status: DC
  Administered 2018-05-24 – 2018-05-25 (×2): 40 mg via ORAL
  Filled 2018-05-24 (×2): qty 1

## 2018-05-24 MED ORDER — ENOXAPARIN SODIUM 30 MG/0.3ML ~~LOC~~ SOLN
30.0000 mg | SUBCUTANEOUS | Status: DC
  Administered 2018-05-24 – 2018-05-25 (×2): 30 mg via SUBCUTANEOUS
  Filled 2018-05-24 (×2): qty 0.3

## 2018-05-24 MED ORDER — ACETAMINOPHEN 325 MG PO TABS: 650.0000 mg | ORAL_TABLET | Freq: Four times a day (QID) | ORAL | Status: DC | PRN

## 2018-05-24 MED ORDER — ENSURE ENLIVE PO LIQD
237.0000 mL | Freq: Two times a day (BID) | ORAL | Status: DC
  Administered 2018-05-24 – 2018-05-25 (×2): 237 mL via ORAL

## 2018-05-24 MED ORDER — POLYETHYLENE GLYCOL 3350 17 G PO PACK: 17.0000 g | PACK | Freq: Every day | ORAL | Status: DC | PRN

## 2018-05-24 MED ORDER — ADULT MULTIVITAMIN W/MINERALS CH
1.0000 | ORAL_TABLET | Freq: Every day | ORAL | Status: DC
  Administered 2018-05-24 – 2018-05-26 (×3): 1 via ORAL
  Filled 2018-05-24 (×3): qty 1

## 2018-05-24 MED ORDER — VITAMIN B-12 100 MCG PO TABS
500.0000 ug | ORAL_TABLET | Freq: Every day | ORAL | Status: DC
  Administered 2018-05-24 – 2018-05-26 (×3): 500 ug via ORAL
  Filled 2018-05-24 (×3): qty 5

## 2018-05-24 MED ORDER — MOMETASONE FURO-FORMOTEROL FUM 100-5 MCG/ACT IN AERO
2.0000 | INHALATION_SPRAY | Freq: Two times a day (BID) | RESPIRATORY_TRACT | Status: DC
  Filled 2018-05-24: qty 8.8

## 2018-05-24 MED ORDER — ONDANSETRON HCL 4 MG/2ML IJ SOLN: 4.0000 mg | Freq: Four times a day (QID) | INTRAMUSCULAR | Status: DC | PRN

## 2018-05-24 MED ORDER — SILDENAFIL CITRATE 20 MG PO TABS
20.0000 mg | ORAL_TABLET | Freq: Two times a day (BID) | ORAL | Status: DC
  Administered 2018-05-24 – 2018-05-26 (×5): 20 mg via ORAL
  Filled 2018-05-24 (×5): qty 1

## 2018-05-24 MED ORDER — ONDANSETRON HCL 4 MG PO TABS: 4.0000 mg | ORAL_TABLET | Freq: Four times a day (QID) | ORAL | Status: DC | PRN

## 2018-05-24 NOTE — Progress Notes (Signed)
[facility] PROGRESS NOTE    Progress Note  Joyce Oliver  LZJ:[id number] DOB: Aug 23, 1924 DOA: 05/23/2018 PCP: Merrilee Seashore, MD     Brief Narrative:   Joyce Oliver is an 85 y.o. female history of CAD remote history of stent, chronic diastolic heart failure, pulmonary hypertension, history of complete heart block in the setting of hyperkalemia last year on August 2019.  Comes in for dizziness that started a few days prior to admission accompanied by chest pain which is retrosternal radiating to her neck, intermittently.  Due to her dizziness she has had a fall and called EMS brought her to the ED.  Assessment/Plan:   Dizziness Question transient complete heart block seen by EMS, will transporting. Cardiologist Dr. Mariel Kansky has been consulted, awaiting recommendations. X-ray the foot is pending. EKG shows sinus rhythm right axis deviation nonspecific T wave changes.  History of pulmonary hypertension: Continue Aubagio.  Chronic kidney disease stage III: Creatinine at baseline.   DVT prophylaxis: lovenox Family Communication:none Disposition Plan/Barrier to D/C: Unable to determine Code Status:     Code Status Orders  (From admission, onward)         Start     Ordered   05/24/18 0502  Do not attempt resuscitation (DNR)  Continuous    Question Answer Comment  In the event of cardiac or respiratory ARREST Do not call a "code blue"   In the event of cardiac or respiratory ARREST Do not perform Intubation, CPR, defibrillation or ACLS   In the event of cardiac or respiratory ARREST Use medication by any route, position, wound care, and other measures to relive pain and suffering. May use oxygen, suction and manual treatment of airway obstruction as needed for comfort.      05/24/18 0501        Code Status History    Date Active Date Inactive Code Status Order ID Comments User Context   05/24/2018 0053 05/24/2018 0501 Full Code [id number]  Rise Patience, MD  ED   10/10/2017 1716 10/13/2017 1829 Full Code [id number]  Sampson Goon, MD Inpatient        IV Access:    Peripheral IV   Procedures and diagnostic studies:   Dg Chest Port 1 View  Result Date: 05/23/2018 CLINICAL DATA:  Shortness of breath. EXAM: PORTABLE CHEST 1 VIEW COMPARISON:  Radiograph 10/10/2017, CT 06/17/2013 FINDINGS: Unchanged heart size and mediastinal contours with aortic atherosclerosis and mild cardiomegaly. No pulmonary edema. No acute airspace disease, large pleural effusion or pneumothorax. Right apical pleuroparenchymal scarring appears similar to prior exam. Vascular stent in the right subclavian. Bones are under mineralized. Remote left rib fractures. IMPRESSION: 1. No acute cardiopulmonary abnormality. Right apical scarring is unchanged from prior. 2.  Aortic Atherosclerosis (ICD10-I70.0). Electronically Signed   By: Keith Rake M.D.   On: 05/23/2018 23:43     Medical Consultants:    None.  Anti-Infectives:   None*  Subjective:    Joyce Oliver she relates she has no further dizziness.  Objective:    Vitals:   05/24/18 0030 05/24/18 0045 05/24/18 0641 05/24/18 0821  BP: 126/62 135/79 (!) 116/53 132/69  Pulse: 60 65 63 (!) 58  Resp: 18 (!) 21 18 16   Temp:   97.9 F (36.6 C)   TempSrc:   Oral   SpO2: 100% 100% 96% 98%  Weight:      Height:       No intake or output data in the 24 hours ending 05/24/18  [place]   05/23/18 2255  Weight: 49.9 kg    Exam: General exam: In no acute distress. Respiratory system: Good air movement and clear to auscultation. Cardiovascular system: S1 & S2 heard, RRR.  Gastrointestinal system: Abdomen is nondistended, soft and nontender.  Central nervous system: Alert and oriented. No focal neurological deficits. Extremities: No pedal edema. Skin: No rashes, lesions or ulcers Psychiatry: Judgement and insight appear normal. Mood & affect appropriate.    Data Reviewed:    Labs: Basic  Metabolic Panel: Recent Labs  Lab 05/23/18 2322 05/24/18 0103  NA 143  --   K 4.7  --   CL 108  --   CO2 23  --   GLUCOSE 104*  --   BUN 26*  --   CREATININE 1.24* 1.34*  CALCIUM 9.3  --   MG  --  1.9   GFR Estimated Creatinine Clearance: 21.5 mL/min (A) (by C-G formula based on SCr of 1.34 mg/dL (H)). Liver Function Tests: Recent Labs  Lab 05/23/18 2322  AST 20  ALT 13  ALKPHOS 60  BILITOT 1.0  PROT 5.9*  ALBUMIN 3.2*   No results for input(s): LIPASE, AMYLASE in the last 168 hours. No results for input(s): AMMONIA in the last 168 hours. Coagulation profile No results for input(s): INR, PROTIME in the last 168 hours.  CBC: Recent Labs  Lab 05/23/18 2322 05/24/18 0103 05/24/18 0853  WBC 8.3 9.0 6.8  NEUTROABS 6.1  --   --   HGB 11.0* 11.0* 10.2*  HCT 35.7* 34.9* 32.1*  MCV 100.0 100.0 99.1  PLT 160 151 153   Cardiac Enzymes: Recent Labs  Lab 05/23/18 2322 05/24/18 0103  TROPONINI <0.03 <0.03   BNP (last 3 results) No results for input(s): PROBNP in the last 8760 hours. CBG: No results for input(s): GLUCAP in the last 168 hours. D-Dimer: No results for input(s): DDIMER in the last 72 hours. Hgb A1c: No results for input(s): HGBA1C in the last 72 hours. Lipid Profile: No results for input(s): CHOL, HDL, LDLCALC, TRIG, CHOLHDL, LDLDIRECT in the last 72 hours. Thyroid function studies: Recent Labs    05/24/18 0103  TSH 2.060   Anemia work up: No results for input(s): VITAMINB12, FOLATE, FERRITIN, TIBC, IRON, RETICCTPCT in the last 72 hours. Sepsis Labs: Recent Labs  Lab 05/23/18 2322 05/24/18 0103 05/24/18 0853  WBC 8.3 9.0 6.8   Microbiology No results found for this or any previous visit (from the past 240 hour(s)).   Medications:   . enoxaparin (LOVENOX) injection  30 mg Subcutaneous Q24H  . feeding supplement (ENSURE ENLIVE)  237 mL Oral BID BM  . fesoterodine  4 mg Oral Daily  . multivitamin with minerals  1 tablet Oral Daily   . pravastatin  40 mg Oral q1800  . sildenafil  20 mg Oral BID  . vitamin B-12  500 mcg Oral Daily   Continuous Infusions:    LOS: 0 days   Charlynne Cousins  [facility]  05/24/2018, 9:48 AM

## 2018-05-24 NOTE — Progress Notes (Signed)
Spoke with Dr. Einar Gip regarding consult. Dr. Einar Gip stated he had not received. He stated he would see pt in the am, but if needed before he was on call tonight. Notified him pt had been in SR majority of day, but at current was in 2nd degree/3rd degree HB and pt asymptomatic. Cont to monitor. Carroll Kinds RN

## 2018-05-24 NOTE — ED Notes (Signed)
ED TO INPATIENT HANDOFF REPORT  ED Nurse Name and Phone #: Tray Martinique, [phone]  S Name/Age/Gender Joyce Oliver 85 y.o. female Room/Bed: 024C/024C  Code Status   Code Status: Full Code  Home/SNF/Other Home Patient oriented to: self Is this baseline? Yes   Triage Complete: Triage complete  Chief Complaint Heart palpitations   Triage Note [facility]- pt from home. Pt complained of chest pain this evening. EMS reports pts having PVC's x7beats. Pt is asymptomatic at this time. Pt is A&O x4. Pt is also SOB. Pts HR has fluctuated from 38-73 bpms. Pt is a DNR pt. pts cardiologist have told family there isn't much they can do to help at this time.   Allergies Allergies  Allergen Reactions  . Penicillins Other (See Comments)    Has patient had a PCN reaction causing immediate rash, facial/tongue/throat swelling, SOB or lightheadedness with hypotension: Unknown Has patient had a PCN reaction causing severe rash involving mucus membranes or skin necrosis: No Has patient had a PCN reaction that required hospitalization No Has patient had a PCN reaction occurring within the last 10 years: No If all of the above answers are "NO", then may proceed with Cephalosporin use.    . Codeine Nausea And Vomiting  . Morphine And Related Nausea And Vomiting    Level of Care/Admitting Diagnosis ED Disposition    ED Disposition Condition Comment   Admit  Hospital Area: [facility] [[id number]]  Level of Care: Progressive [102]  I expect the patient will be discharged within 24 hours: No (not a candidate for 5C-Observation unit)  Diagnosis: Dizziness [678938]  Admitting Physician: Rise Patience [phone]  Attending Physician: Rise Patience [[id number]  PT Class (Do Not Modify): Observation [104]  PT Acc Code (Do Not Modify): Observation [10022]       B Medical/Surgery History Past Medical History:  Diagnosis Date  . Cancer of breast, female [facility]) 1973   rt mastectomy   . Hypertension    Past Surgical History:  Procedure Laterality Date  . CARDIAC CATHETERIZATION N/A 07/08/2015   Procedure: Right Heart Cath;  Surgeon: Adrian Prows, MD;  Location: [facility];  Service: Cardiovascular;  Laterality: N/A;  . CORONARY ANGIOPLASTY    . EYE SURGERY     Cataract surgery, bilateral lens implant  . hip revison-right  Right   . HIP SURGERY Right   . KNEE ARTHROSCOPY Right   . MASTECTOMY Right   . PERIPHERAL VASCULAR CATHETERIZATION N/A 07/08/2015   Procedure: Upper Extremity Angiography/Subclavion;  Surgeon: Adrian Prows, MD;  Location: [facility];  Service: Cardiovascular;  Laterality: N/A;  . PERIPHERAL VASCULAR CATHETERIZATION  07/08/2015   Procedure: Peripheral Vascular Intervention;  Surgeon: Adrian Prows, MD;  Location: [place] [facility];  Service: Cardiovascular;;  rt axillary INNOVA 5X80     A IV Location/Drains/Wounds Patient Lines/Drains/Airways Status   Active Line/Drains/Airways    Name:   Placement date:   Placement time:   Site:   Days:   Wound / Incision (Open or Dehisced) 10/11/17 [place];Other (Comment) Arm Left Full Thickness/Deep Tissue Injury   10/11/17    1800    Arm   225   Wound / Incision (Open or Dehisced) 10/11/17 Other (Comment) Arm Left Full Thickness/Deep Tissue Injury   10/11/17    1800    Arm   225          Intake/Output Last 24 hours No intake or output data in the 24 hours ending  05/24/18 0101  Labs/Imaging Results for orders placed or performed during the hospital encounter of 05/23/18 (from the past 48 hour(s))  Comprehensive metabolic panel     Status: Abnormal   Collection Time: 05/23/18 11:22 PM  Result Value Ref Range   Sodium 143 135 - 145 mmol/L   Potassium 4.7 3.5 - 5.1 mmol/L   Chloride 108 98 - 111 mmol/L   CO2 23 22 - 32 mmol/L   Glucose, Bld 104 (H) 70 - 99 mg/dL   BUN 26 (H) 8 - 23 mg/dL   Creatinine, Ser 1.24 (H) 0.44 - 1.00 mg/dL   Calcium 9.3 8.9 - 10.3 mg/dL   Total Protein 5.9 (L) 6.5 -  8.1 g/dL   Albumin 3.2 (L) 3.5 - 5.0 g/dL   AST 20 15 - 41 U/L   ALT 13 0 - 44 U/L   Alkaline Phosphatase 60 38 - 126 U/L   Total Bilirubin 1.0 0.3 - 1.2 mg/dL   GFR calc non Af Amer 38 (L) >60 mL/min   GFR calc Af Amer 44 (L) >60 mL/min   Anion gap 12 5 - 15    Comment: Performed at [facility], [address], [place], [place] [address]  CBC with Differential     Status: Abnormal   Collection Time: 05/23/18 11:22 PM  Result Value Ref Range   WBC 8.3 4.0 - 10.5 K/uL   RBC 3.57 (L) 3.87 - 5.11 MIL/uL   Hemoglobin 11.0 (L) 12.0 - 15.0 g/dL   HCT 35.7 (L) 36.0 - 46.0 %   MCV 100.0 80.0 - 100.0 fL   MCH 30.8 26.0 - 34.0 pg   MCHC 30.8 30.0 - 36.0 g/dL   RDW 13.7 11.5 - 15.5 %   Platelets 160 150 - 400 K/uL   nRBC 0.0 0.0 - 0.2 %   Neutrophils Relative % 73 %   Neutro Abs 6.1 1.7 - 7.7 K/uL   Lymphocytes Relative 15 %   Lymphs Abs 1.3 0.7 - 4.0 K/uL   Monocytes Relative 9 %   Monocytes Absolute 0.7 0.1 - 1.0 K/uL   Eosinophils Relative 2 %   Eosinophils Absolute 0.2 0.0 - 0.5 K/uL   Basophils Relative 0 %   Basophils Absolute 0.0 0.0 - 0.1 K/uL   Immature Granulocytes 1 %   Abs Immature Granulocytes 0.06 0.00 - 0.07 K/uL    Comment: Performed at [facility] [address], [place], [place] [address]  Troponin I - Once     Status: None   Collection Time: 05/23/18 11:22 PM  Result Value Ref Range   Troponin I <0.03 <0.03 ng/mL    Comment: Performed at [facility] [address] [place],  [address]   Dg Chest Port 1 View  Result Date: 05/23/2018 CLINICAL DATA:  Shortness of breath. EXAM: PORTABLE CHEST 1 VIEW COMPARISON:  Radiograph 10/10/2017, CT 06/17/2013 FINDINGS: Unchanged heart size and mediastinal contours with aortic atherosclerosis and mild cardiomegaly. No pulmonary edema. No acute airspace disease, large pleural effusion or pneumothorax. Right apical pleuroparenchymal scarring appears similar to prior exam. Vascular stent in the right  subclavian. Bones are under mineralized. Remote left rib fractures. IMPRESSION: 1. No acute cardiopulmonary abnormality. Right apical scarring is unchanged from prior. 2.  Aortic Atherosclerosis (ICD10-I70.0). Electronically Signed   By: Keith Rake M.D.   On: 05/23/2018 23:43    Pending Labs [employer] (From admission, onward)    Start  Ordered   05/31/18 0500  Creatinine, serum  (enoxaparin (LOVENOX)    CrCl >/= 30 ml/min)  Weekly,   R    Comments:  while on enoxaparin therapy    05/24/18 0053   05/24/18 5361  Basic metabolic panel  Tomorrow morning,   R     05/24/18 0053   05/24/18 0500  CBC  Tomorrow morning,   R     05/24/18 0053   05/24/18 0054  Magnesium  Once,   R     05/24/18 0053   05/24/18 0054  TSH  Once,   R     05/24/18 0053   05/24/18 0054  Troponin I - Now Then Q6H  Now then every 6 hours,   R     05/24/18 0053   05/24/18 0053  CBC  (enoxaparin (LOVENOX)    CrCl >/= 30 ml/min)  Once,   R    Comments:  Baseline for enoxaparin therapy IF NOT ALREADY DRAWN.  Notify MD if PLT < 100 K.    05/24/18 0053   05/24/18 0053  Creatinine, serum  (enoxaparin (LOVENOX)    CrCl >/= 30 ml/min)  Once,   R    Comments:  Baseline for enoxaparin therapy IF NOT ALREADY DRAWN.    05/24/18 0053          Vitals/Pain Today's Vitals   05/23/18 2257 05/23/18 2300 05/23/18 2302 05/23/18 2315  BP:  113/67  130/84  Pulse: 69 65  71  Resp: (!) 26 20  (!) 23  Temp:   98 F (36.7 C)   TempSrc:   Oral   SpO2: 100% 99%  96%  Weight:      Height:      PainSc:        Isolation Precautions No active isolations  Medications Medications  pravastatin (PRAVACHOL) tablet 40 mg (has no administration in time range)  sildenafil (REVATIO) tablet 20 mg (has no administration in time range)  polyethylene glycol (MIRALAX / GLYCOLAX) packet 17 g (has no administration in time range)  fesoterodine (TOVIAZ) tablet 4 mg (has no administration in time range)  vitamin B-12  (CYANOCOBALAMIN) tablet 500 mcg (has no administration in time range)  feeding supplement (ENSURE ENLIVE) (ENSURE ENLIVE) liquid 237 mL (has no administration in time range)  multivitamin with minerals tablet 1 tablet (has no administration in time range)  albuterol (PROVENTIL) (2.5 MG/3ML) 0.083% nebulizer solution 3 mL (has no administration in time range)  mometasone-formoterol (DULERA) 100-5 MCG/ACT inhaler 2 puff (has no administration in time range)  acetaminophen (TYLENOL) tablet 650 mg (has no administration in time range)    Or  acetaminophen (TYLENOL) suppository 650 mg (has no administration in time range)  ondansetron (ZOFRAN) tablet 4 mg (has no administration in time range)    Or  ondansetron (ZOFRAN) injection 4 mg (has no administration in time range)  enoxaparin (LOVENOX) injection 30 mg (has no administration in time range)    Mobility non-ambulatory Low fall risk   Focused Assessments Cardiac Assessment Handoff:  Cardiac Rhythm: Sinus bradycardia Lab Results  Component Value Date   CKTOTAL 58 10/10/2017   TROPONINI <0.03 05/23/2018   No results found for: DDIMER Does the Patient currently have chest pain? No     R Recommendations: See Admitting Provider Note  Report given to:   Additional Notes:

## 2018-05-24 NOTE — H&P (Signed)
History and Physical    Joyce Oliver CWC:[id number] DOB: [birth year] DOA: 05/23/2018  PCP: Merrilee Seashore, MD  Patient coming from: Home.  Chief Complaint: Dizziness.  HPI: Joyce Oliver is a 85 y.o. female with history of CAD status post remote stenting, diastolic dysfunction, pulmonary hypertension, previous history of complete heart block in the setting of hyperkalemia last year in August 2019 had a fall at home while walking.  Patient states she has been feeling dizzy for last few days.  Also has some chest pain.  Chest pain is retrosternal radiating to her neck.  Happens off and on.  Due to her dizziness and fall she called EMS and patient was brought to the ER.  On the way patient was found to be transiently on complete heart block which resolved without any intervention.  ED Course: In the ER EKG shows normal sinus rhythm nothing acute.  Hemoglobin 11 creatinine at baseline.  Patient admitted for further observation given the transient complete heart block with dizziness.  On exam patient has some ecchymotic area on the left foot where patient states she hurt herself while falling.  Review of Systems: As per HPI, rest all negative.   Past Medical History:  Diagnosis Date  . Cancer of breast, female [facility]) 1973   rt mastectomy  . Hypertension     Past Surgical History:  Procedure Laterality Date  . CARDIAC CATHETERIZATION N/A 07/08/2015   Procedure: Right Heart Cath;  Surgeon: Adrian Prows, MD;  Location: [facility];  Service: Cardiovascular;  Laterality: N/A;  . CORONARY ANGIOPLASTY    . EYE SURGERY     Cataract surgery, bilateral lens implant  . hip revison-right  Right   . HIP SURGERY Right   . KNEE ARTHROSCOPY Right   . MASTECTOMY Right   . PERIPHERAL VASCULAR CATHETERIZATION N/A 07/08/2015   Procedure: Upper Extremity Angiography/Subclavion;  Surgeon: Adrian Prows, MD;  Location: [place] [facility];  Service: Cardiovascular;  Laterality: N/A;  . PERIPHERAL VASCULAR  CATHETERIZATION  07/08/2015   Procedure: Peripheral Vascular Intervention;  Surgeon: Adrian Prows, MD;  Location: [place] [facility];  Service: Cardiovascular;;  rt axillary INNOVA 5X80     reports that she has never smoked. She has never used smokeless tobacco. She reports that she does not drink alcohol or use drugs.  Allergies  Allergen Reactions  . Penicillins Other (See Comments)    Has patient had a PCN reaction causing immediate rash, facial/tongue/throat swelling, SOB or lightheadedness with hypotension: Unknown Has patient had a PCN reaction causing severe rash involving mucus membranes or skin necrosis: No Has patient had a PCN reaction that required hospitalization No Has patient had a PCN reaction occurring within the last 10 years: No If all of the above answers are "NO", then may proceed with Cephalosporin use.    . Codeine Nausea And Vomiting  . Morphine And Related Nausea And Vomiting    Family History  Family history unknown: Yes    Prior to Admission medications   Medication Sig Start Date End Date Taking? Authorizing Provider  albuterol (PROVENTIL HFA;VENTOLIN HFA) 108 (90 Base) MCG/ACT inhaler Inhale 2 puffs into the lungs every 4 (four) hours as needed for wheezing or shortness of breath.    [provider]  budesonide-formoterol (SYMBICORT) 80-4.5 MCG/ACT inhaler Inhale 2 puffs into the lungs 2 (two) times daily.    [provider]  feeding supplement, ENSURE ENLIVE, (ENSURE ENLIVE) LIQD Take 237 mLs by mouth 2 (two)  times daily between meals. 10/13/17   Bonnell Public, MD  lovastatin (MEVACOR) 40 MG tablet Take 0.5 tablets (20 mg total) by mouth daily after supper. 10/13/17   Bonnell Public, MD  Multiple Vitamin (MULTIVITAMIN WITH MINERALS) TABS tablet Take 1 tablet by mouth daily.    [provider]  polyethylene glycol (MIRALAX / GLYCOLAX) packet Take 17 g by mouth daily as needed (constipation). Mix in cup of coffee and drink     [provider]  sildenafil (REVATIO) 20 MG tablet Take 20 mg by mouth 2 (two) times daily. 07/26/17   [provider]  tolterodine (DETROL LA) 2 MG 24 hr capsule Take 1 capsule (2 mg total) by mouth daily after supper. 10/13/17   Bonnell Public, MD  vitamin B-12 (CYANOCOBALAMIN) 500 MCG tablet Take 500 mcg by mouth daily.    [provider]    Physical Exam: Vitals:   05/23/18 2257 05/23/18 2300 05/23/18 2302 05/23/18 2315  BP:  113/67  130/84  Pulse: 69 65  71  Resp: (!) 26 20  (!) 23  Temp:   98 F (36.7 C)   TempSrc:   Oral   SpO2: 100% 99%  96%  Weight:      Height:          Constitutional: Moderately built and nourished. Vitals:   05/23/18 2257 05/23/18 2300 05/23/18 2302 05/23/18 2315  BP:  113/67  130/84  Pulse: 69 65  71  Resp: (!) 26 20  (!) 23  Temp:   98 F (36.7 C)   TempSrc:   Oral   SpO2: 100% 99%  96%  Weight:      Height:       Eyes: Anicteric no pallor. ENMT: No discharge from the ears eyes nose and mouth. Neck: No mass felt.  No neck rigidity. Respiratory: No rhonchi or crepitations. Cardiovascular: S1-S2 heard. Abdomen: Soft nontender bowel sounds present. Musculoskeletal: Left foot has some bruising. Skin: Ecchymotic area on the left foot. Neurologic: Alert awake oriented to time place and person.  Moves all extremities. Psychiatric: Appears normal.   Labs on Admission: I have personally reviewed following labs and imaging studies  CBC: Recent Labs  Lab 05/23/18 2322  WBC 8.3  NEUTROABS 6.1  HGB 11.0*  HCT 35.7*  MCV 100.0  PLT 500   Basic Metabolic Panel: Recent Labs  Lab 05/23/18 2322  NA 143  K 4.7  CL 108  CO2 23  GLUCOSE 104*  BUN 26*  CREATININE 1.24*  CALCIUM 9.3   GFR: Estimated Creatinine Clearance: 23.3 mL/min (A) (by C-G formula based on SCr of 1.24 mg/dL (H)). Liver Function Tests: Recent Labs  Lab 05/23/18 2322  AST 20  ALT 13  ALKPHOS 60  BILITOT 1.0  PROT 5.9*  ALBUMIN  3.2*   No results for input(s): LIPASE, AMYLASE in the last 168 hours. No results for input(s): AMMONIA in the last 168 hours. Coagulation Profile: No results for input(s): INR, PROTIME in the last 168 hours. Cardiac Enzymes: Recent Labs  Lab 05/23/18 2322  TROPONINI <0.03   BNP (last 3 results) No results for input(s): PROBNP in the last 8760 hours. HbA1C: No results for input(s): HGBA1C in the last 72 hours. CBG: No results for input(s): GLUCAP in the last 168 hours. Lipid Profile: No results for input(s): CHOL, HDL, LDLCALC, TRIG, CHOLHDL, LDLDIRECT in the last 72 hours. Thyroid Function Tests: No results for input(s): TSH, T4TOTAL, FREET4, T3FREE, THYROIDAB in the  last 72 hours. Anemia Panel: No results for input(s): VITAMINB12, FOLATE, FERRITIN, TIBC, IRON, RETICCTPCT in the last 72 hours. Urine analysis:    Component Value Date/Time   COLORURINE YELLOW 02/28/2008 1000   APPEARANCEUR CLEAR 02/28/2008 1000   LABSPEC 1.023 02/28/2008 1000   PHURINE 5.5 02/28/2008 1000   GLUCOSEU NEGATIVE 02/28/2008 1000   HGBUR NEGATIVE 02/28/2008 1000   BILIRUBINUR SMALL (A) 02/28/2008 1000   KETONESUR TRACE (A) 02/28/2008 1000   PROTEINUR NEGATIVE 02/28/2008 1000   UROBILINOGEN 1.0 02/28/2008 1000   NITRITE NEGATIVE 02/28/2008 1000   LEUKOCYTESUR MODERATE (A) 02/28/2008 1000   Sepsis Labs: @LABRCNTIP (procalcitonin:4,lacticidven:4) )No results found for this or any previous visit (from the past 240 hour(s)).   Radiological Exams on Admission: Dg Chest Port 1 View  Result Date: 05/23/2018 CLINICAL DATA:  Shortness of breath. EXAM: PORTABLE CHEST 1 VIEW COMPARISON:  Radiograph 10/10/2017, CT 06/17/2013 FINDINGS: Unchanged heart size and mediastinal contours with aortic atherosclerosis and mild cardiomegaly. No pulmonary edema. No acute airspace disease, large pleural effusion or pneumothorax. Right apical pleuroparenchymal scarring appears similar to prior exam. Vascular stent in the  right subclavian. Bones are under mineralized. Remote left rib fractures. IMPRESSION: 1. No acute cardiopulmonary abnormality. Right apical scarring is unchanged from prior. 2.  Aortic Atherosclerosis (ICD10-I70.0). Electronically Signed   By: Keith Rake M.D.   On: 05/23/2018 23:43    EKG: Independently reviewed.  Normal sinus rhythm.  Assessment/Plan Principal Problem:   Dizziness Active Problems:   Third degree AV block (HCC)    1. Dizziness with transient complete heart block seen by EMS while transporting has resolved without any intervention.  Will closely monitor in telemetry and consult Dr. Einar Gip patient's cardiologist.  Check TSH cardiac markers. 2. History of pulmonary hypertension on Revatio. 3. Chronic kidney disease stage III creatinine appears to be at baseline. 4. Chronic anemia likely from renal disease follow CBC.  Hemoglobin at baseline.  X-ray of the pelvis and left foot are pending.   DVT prophylaxis: Lovenox. Code Status: DNR. Family Communication: Discussed with patient. Disposition Plan: To be determined. Consults called: We will consult cardiology. Admission status: Observation.   Rise Patience MD [facility] Pager [phone].  If 7PM-7AM, please contact night-coverage [email or web address] Password [facility]  05/24/2018, 12:54 AM

## 2018-05-24 NOTE — Care Management Obs Status (Signed)
[place] NOTIFICATION   Patient Details  Name: DONABELLE MOLDEN MRN: [id number] Date of Birth: Jul 12, 1924   Medicare Observation Status Notification Given:  Yes    Carles Collet, RN 05/24/2018, 12:39 PM

## 2018-05-24 NOTE — Plan of Care (Signed)
  Problem: Pain Managment: Goal: General experience of comfort will improve Outcome: Progressing Note:  No s/s of pain or discomfort.   

## 2018-05-25 ENCOUNTER — Encounter (HOSPITAL_COMMUNITY)
Admission: EM | Disposition: A | Discharge status: Home Health | Payer: Self-pay | Source: Home / Self Care | Attending: Internal Medicine

## 2018-05-25 DIAGNOSIS — J45909 Unspecified asthma, uncomplicated: Secondary | ICD-10-CM | POA: Diagnosis present

## 2018-05-25 DIAGNOSIS — I2510 Atherosclerotic heart disease of native coronary artery without angina pectoris: Secondary | ICD-10-CM | POA: Diagnosis present

## 2018-05-25 DIAGNOSIS — I5032 Chronic diastolic (congestive) heart failure: Secondary | ICD-10-CM | POA: Diagnosis present

## 2018-05-25 DIAGNOSIS — Z7951 Long term (current) use of inhaled steroids: Secondary | ICD-10-CM | POA: Diagnosis not present

## 2018-05-25 DIAGNOSIS — E875 Hyperkalemia: Secondary | ICD-10-CM | POA: Diagnosis present

## 2018-05-25 DIAGNOSIS — Z9861 Coronary angioplasty status: Secondary | ICD-10-CM | POA: Diagnosis not present

## 2018-05-25 DIAGNOSIS — J4520 Mild intermittent asthma, uncomplicated: Secondary | ICD-10-CM

## 2018-05-25 DIAGNOSIS — Z885 Allergy status to narcotic agent status: Secondary | ICD-10-CM | POA: Diagnosis not present

## 2018-05-25 DIAGNOSIS — Z66 Do not resuscitate: Secondary | ICD-10-CM | POA: Diagnosis present

## 2018-05-25 DIAGNOSIS — R42 Dizziness and giddiness: Secondary | ICD-10-CM | POA: Diagnosis present

## 2018-05-25 DIAGNOSIS — I495 Sick sinus syndrome: Secondary | ICD-10-CM

## 2018-05-25 DIAGNOSIS — N183 Chronic kidney disease, stage 3 (moderate): Secondary | ICD-10-CM | POA: Diagnosis present

## 2018-05-25 DIAGNOSIS — Z853 Personal history of malignant neoplasm of breast: Secondary | ICD-10-CM | POA: Diagnosis not present

## 2018-05-25 DIAGNOSIS — I441 Atrioventricular block, second degree: Secondary | ICD-10-CM | POA: Diagnosis not present

## 2018-05-25 DIAGNOSIS — Z9011 Acquired absence of right breast and nipple: Secondary | ICD-10-CM | POA: Diagnosis not present

## 2018-05-25 DIAGNOSIS — D631 Anemia in chronic kidney disease: Secondary | ICD-10-CM | POA: Diagnosis present

## 2018-05-25 DIAGNOSIS — Z955 Presence of coronary angioplasty implant and graft: Secondary | ICD-10-CM | POA: Diagnosis not present

## 2018-05-25 DIAGNOSIS — D649 Anemia, unspecified: Secondary | ICD-10-CM | POA: Diagnosis not present

## 2018-05-25 DIAGNOSIS — I2722 Pulmonary hypertension due to left heart disease: Secondary | ICD-10-CM | POA: Diagnosis present

## 2018-05-25 DIAGNOSIS — Z880 Allergy status to penicillin: Secondary | ICD-10-CM | POA: Diagnosis not present

## 2018-05-25 DIAGNOSIS — I130 Hypertensive heart and chronic kidney disease with heart failure and stage 1 through stage 4 chronic kidney disease, or unspecified chronic kidney disease: Secondary | ICD-10-CM | POA: Diagnosis present

## 2018-05-25 DIAGNOSIS — I442 Atrioventricular block, complete: Secondary | ICD-10-CM | POA: Diagnosis present

## 2018-05-25 HISTORY — PX: PACEMAKER IMPLANT: EP1218

## 2018-05-25 LAB — SURGICAL PCR SCREEN
MRSA, PCR: NEGATIVE
Staphylococcus aureus: POSITIVE — AB

## 2018-05-25 SURGERY — PACEMAKER IMPLANT
Anesthesia: LOCAL

## 2018-05-25 MED ORDER — VANCOMYCIN HCL IN DEXTROSE 1-5 GM/200ML-% IV SOLN
INTRAVENOUS | Status: AC
  Filled 2018-05-25: qty 200

## 2018-05-25 MED ORDER — SODIUM CHLORIDE 0.9 % IV SOLN
80.0000 mg | INTRAVENOUS | Status: AC
  Administered 2018-05-25: 80 mg
  Filled 2018-05-25: qty 2

## 2018-05-25 MED ORDER — LIDOCAINE HCL 1 % IJ SOLN
INTRAMUSCULAR | Status: AC
  Filled 2018-05-25: qty 60

## 2018-05-25 MED ORDER — IOHEXOL 350 MG/ML SOLN
INTRAVENOUS | Status: DC | PRN
  Administered 2018-05-25: 15 mL via INTRAVENOUS

## 2018-05-25 MED ORDER — ALBUTEROL SULFATE (2.5 MG/3ML) 0.083% IN NEBU: 2.5000 mg | INHALATION_SOLUTION | Freq: Four times a day (QID) | RESPIRATORY_TRACT | Status: DC | PRN

## 2018-05-25 MED ORDER — SODIUM CHLORIDE 0.9 % IV SOLN
250.0000 mL | INTRAVENOUS | Status: DC
  Administered 2018-05-25: 250 mL via INTRAVENOUS

## 2018-05-25 MED ORDER — SODIUM CHLORIDE 0.9% FLUSH: 3.0000 mL | Freq: Two times a day (BID) | INTRAVENOUS | Status: DC

## 2018-05-25 MED ORDER — DEXTROSE-NACL 5-0.45 % IV SOLN
INTRAVENOUS | Status: DC
  Administered 2018-05-25 – 2018-05-26 (×3): via INTRAVENOUS

## 2018-05-25 MED ORDER — HEPARIN (PORCINE) IN NACL 1000-0.9 UT/500ML-% IV SOLN
INTRAVENOUS | Status: DC | PRN
  Administered 2018-05-25: 500 mL

## 2018-05-25 MED ORDER — HEPARIN (PORCINE) IN NACL 1000-0.9 UT/500ML-% IV SOLN
INTRAVENOUS | Status: AC
  Filled 2018-05-25: qty 500

## 2018-05-25 MED ORDER — LIDOCAINE HCL (PF) 1 % IJ SOLN
INTRAMUSCULAR | Status: DC | PRN
  Administered 2018-05-25: 60 mL

## 2018-05-25 MED ORDER — CHLORHEXIDINE GLUCONATE 4 % EX LIQD
60.0000 mL | Freq: Once | CUTANEOUS | Status: AC
  Administered 2018-05-25: 4 via TOPICAL
  Filled 2018-05-25: qty 60

## 2018-05-25 MED ORDER — SODIUM CHLORIDE 0.9 % IV SOLN: INTRAVENOUS | Status: DC

## 2018-05-25 MED ORDER — ALBUTEROL SULFATE (2.5 MG/3ML) 0.083% IN NEBU
2.5000 mg | INHALATION_SOLUTION | Freq: Once | RESPIRATORY_TRACT | Status: AC
  Administered 2018-05-25: 2.5 mg via RESPIRATORY_TRACT
  Filled 2018-05-25: qty 3

## 2018-05-25 MED ORDER — SODIUM CHLORIDE 0.9% FLUSH: 3.0000 mL | INTRAVENOUS | Status: DC | PRN

## 2018-05-25 MED ORDER — VANCOMYCIN HCL 1000 MG IV SOLR
1000.0000 mg | INTRAVENOUS | Status: AC
  Administered 2018-05-25: 1000 mg via INTRAVENOUS
  Filled 2018-05-25: qty 1000

## 2018-05-25 MED ORDER — SODIUM CHLORIDE 0.9 % IV SOLN
INTRAVENOUS | Status: AC
  Filled 2018-05-25: qty 2

## 2018-05-25 MED ORDER — VANCOMYCIN HCL 1000 MG IV SOLR
1000.0000 mg | Freq: Two times a day (BID) | INTRAVENOUS | Status: AC
  Administered 2018-05-25: 1000 mg via INTRAVENOUS
  Filled 2018-05-25: qty 1000

## 2018-05-25 MED ORDER — CHLORHEXIDINE GLUCONATE 4 % EX LIQD: 60.0000 mL | Freq: Once | CUTANEOUS | Status: DC

## 2018-05-25 SURGICAL SUPPLY — 7 items
CABLE SURGICAL S-101-97-12 (CABLE) ×2 IMPLANT
LEAD TENDRIL MRI 46CM LPA1200M (Lead) ×1 IMPLANT
LEAD TENDRIL MRI 52CM LPA1200M (Lead) ×1 IMPLANT
PACEMAKER ASSURITY DR-RF (Pacemaker) ×1 IMPLANT
PAD PRO RADIOLUCENT 2001M-C (PAD) ×2 IMPLANT
SHEATH CLASSIC 8F (SHEATH) ×2 IMPLANT
TRAY PACEMAKER INSERTION (PACKS) ×2 IMPLANT

## 2018-05-25 NOTE — Discharge Instructions (Signed)
° ° °  ONCE HOME, PLEASE SEND A REMOTE DEVICE TRANSMISSION  and  PLEASE SIGN UP TO YOUR MY CHART ACCOUNT WHEN YOU GET HOME (if you aren't already).  IN THE CURRENT ENVIRONMENT WITH COVID-19, IN EFFORT TO REDUCE YOUR EXPOSURE WE WILL BE CONDUCTING MANY PATIENT VISITS BY EITHER ELECTRONIC/VIRTUAL VISITS or TELEPHONE VISITS.  BEING SIGNED UP IN YOUR MY CHART ACCOUNT  WILL HELP FACILITATE THESE VISITS AND OUR COMMUNICATION WITH YOU    Supplemental Discharge Instructions for  Pacemaker/Defibrillator Patients  Activity No heavy lifting or vigorous activity with your left/right arm for 6 to 8 weeks.  Do not raise your left/right arm above your head for one week.  Gradually raise your affected arm as drawn below.             05/29/2018                 05/30/2018                05/31/2018                 06/01/2018 __  NO DRIVING (patient does not drive) WOUND CARE - Keep the wound area clean and dry.  Do not get this area wet, no showers until cleared to at your wound check visit - The tape/steri-strips on your wound will fall off; do not pull them off.  No bandage is needed on the site.  DO  NOT apply any creams, oils, or ointments to the wound area. - If you notice any drainage or discharge from the wound, any swelling or bruising at the site, or you develop a fever > 101? F after you are discharged home, call the office at once.  Special Instructions - You are still able to use cellular telephones; use the ear opposite the side where you have your pacemaker/defibrillator.  Avoid carrying your cellular phone near your device. - When traveling through airports, show security personnel your identification card to avoid being screened in the metal detectors.  Ask the security personnel to use the hand wand. - Avoid arc welding equipment, MRI testing (magnetic resonance imaging), TENS units (transcutaneous nerve stimulators).  Call the office for questions about other devices. - Avoid electrical  appliances that are in poor condition or are not properly grounded. - Microwave ovens are safe to be near or to operate.

## 2018-05-25 NOTE — Progress Notes (Signed)
[facility] PROGRESS NOTE    Progress Note  Joyce Oliver  OQH:[id number] DOB: 12-04-1924 DOA: 05/23/2018 PCP: Merrilee Seashore, MD     Brief Narrative:   Joyce Oliver is an 85 y.o. female history of CAD remote history of stent, chronic diastolic heart failure, pulmonary hypertension, history of complete heart block in the setting of hyperkalemia last year on August 2019.  Comes in for dizziness that started a few days prior to admission accompanied by chest pain which is retrosternal radiating to her neck, intermittently.  Due to her dizziness she has had a fall and called EMS brought her to the ED.  Assessment/Plan:   Dizziness: Spoke with cardiology this morning, they will see their consider about the patient having intermittent third-degree AV block.  She had a 5-second pause on telemetry.  Place n.p.o. for possible pacemaker placement. Acute abnormalities.  History of pulmonary hypertension: Continue Revatio.  Chronic kidney disease stage III: Creatinine at baseline.   DVT prophylaxis: lovenox Family Communication:none Disposition Plan/Barrier to D/C: Unable to determine Code Status:     Code Status Orders  (From admission, onward)         Start     Ordered   05/24/18 0502  Do not attempt resuscitation (DNR)  Continuous    Question Answer Comment  In the event of cardiac or respiratory ARREST Do not call a "code blue"   In the event of cardiac or respiratory ARREST Do not perform Intubation, CPR, defibrillation or ACLS   In the event of cardiac or respiratory ARREST Use medication by any route, position, wound care, and other measures to relive pain and suffering. May use oxygen, suction and manual treatment of airway obstruction as needed for comfort.      05/24/18 0501        Code Status History    Date Active Date Inactive Code Status Order ID Comments User Context   05/24/2018 0053 05/24/2018 0501 Full Code [id number]  Rise Patience, MD ED   10/10/2017 1716 10/13/2017 1829 Full Code [id number]  Sampson Goon, MD Inpatient        IV Access:    Peripheral IV   Procedures and diagnostic studies:   Dg Pelvis 1-2 Views  Result Date: 05/24/2018 CLINICAL DATA:  Fall, history hypertension and breast cancer EXAM: PELVIS - 1-2 VIEW COMPARISON:  03/05/2008 FINDINGS: Marked osseous demineralization. RIGHT hip prosthesis. Advanced osteoarthritic changes of the LEFT hip joint with joint space narrowing, spur formation, bone-on-bone appearance and subchondral sclerosis. Question old healed fracture of the RIGHT inferior pubic ramus. Distal aspect of the hip prosthesis is not imaged. No acute fracture, dislocation or bone destruction. IMPRESSION: Osseous demineralization with prior RIGHT hip arthroplasty and advanced osteoarthritic changes of the LEFT hip joint. Question old healed fracture RIGHT inferior pubic ramus. No definite acute osseous abnormalities. Electronically Signed   By: Lavonia Dana M.D.   On: 05/24/2018 10:01   Dg Chest Port 1 View  Result Date: 05/23/2018 CLINICAL DATA:  Shortness of breath. EXAM: PORTABLE CHEST 1 VIEW COMPARISON:  Radiograph 10/10/2017, CT 06/17/2013 FINDINGS: Unchanged heart size and mediastinal contours with aortic atherosclerosis and mild cardiomegaly. No pulmonary edema. No acute airspace disease, large pleural effusion or pneumothorax. Right apical pleuroparenchymal scarring appears similar to prior exam. Vascular stent in the right subclavian. Bones are under mineralized. Remote left rib fractures. IMPRESSION: 1. No acute cardiopulmonary abnormality. Right apical scarring is unchanged from prior. 2.  Aortic Atherosclerosis (ICD10-I70.0). Electronically Signed  By: Keith Rake M.D.   On: 05/23/2018 23:43   Dg Foot 2 Views Left  Result Date: 05/24/2018 CLINICAL DATA:  Tenderness at top of LEFT foot for few days post fall EXAM: LEFT FOOT - 2 VIEW COMPARISON:  None FINDINGS: Diffuse osseous  demineralization. Question subjective pes cavus. Joint spaces preserved. No acute fracture, dislocation, or bone destruction. IMPRESSION: No acute abnormalities. Electronically Signed   By: Lavonia Dana M.D.   On: 05/24/2018 09:59     Medical Consultants:    None.  Anti-Infectives:   None  Subjective:    Joyce Oliver no new complaints.  Objective:    Vitals:   05/24/18 0821 05/24/18 1325 05/24/18 1958 05/25/18 0424  BP: 132/69 138/78 140/69 109/76  Pulse: (!) 58 70 60 65  Resp: 16 20 17  (!) 24  Temp:  97.6 F (36.4 C) 97.7 F (36.5 C) 97.8 F (36.6 C)  TempSrc:  Oral Oral Oral  SpO2: 98% 90% 96% 98%  Weight:    48.6 kg  Height:        Intake/Output Summary (Last 24 hours) at 05/25/2018 0829 Last data filed at 05/25/2018 0440 Gross per 24 hour  Intake 720 ml  Output 400 ml  Net 320 ml   Filed Weights   05/23/18 2255 05/25/18 0424  Weight: 49.9 kg 48.6 kg    Exam: General exam: In no acute distress. Respiratory system: Good air movement and clear to auscultation. Cardiovascular system: S1 & S2 heard, RRR.  Gastrointestinal system: Abdomen is nondistended, soft and nontender.  Central nervous system: Alert and oriented. No focal neurological deficits. Extremities: No pedal edema. Skin: No rashes, lesions or ulcers Psychiatry: Judgement and insight appear normal. Mood & affect appropriate.    Data Reviewed:    Labs: Basic Metabolic Panel: Recent Labs  Lab 05/23/18 2322 05/24/18 0103 05/24/18 0739  NA 143  --  142  K 4.7  --  4.5  CL 108  --  114*  CO2 23  --  19*  GLUCOSE 104*  --  82  BUN 26*  --  25*  CREATININE 1.24* 1.34* 1.13*  CALCIUM 9.3  --  8.5*  MG  --  1.9  --    GFR Estimated Creatinine Clearance: 24.9 mL/min (A) (by C-G formula based on SCr of 1.13 mg/dL (H)). Liver Function Tests: Recent Labs  Lab 05/23/18 2322  AST 20  ALT 13  ALKPHOS 60  BILITOT 1.0  PROT 5.9*  ALBUMIN 3.2*   No results for input(s): LIPASE, AMYLASE  in the last 168 hours. No results for input(s): AMMONIA in the last 168 hours. Coagulation profile No results for input(s): INR, PROTIME in the last 168 hours.  CBC: Recent Labs  Lab 05/23/18 2322 05/24/18 0103 05/24/18 0853  WBC 8.3 9.0 6.8  NEUTROABS 6.1  --   --   HGB 11.0* 11.0* 10.2*  HCT 35.7* 34.9* 32.1*  MCV 100.0 100.0 99.1  PLT 160 151 153   Cardiac Enzymes: Recent Labs  Lab 05/23/18 2322 05/24/18 0103 05/24/18 0739 05/24/18 1239  TROPONINI <0.03 <0.03 <0.03 <0.03   BNP (last 3 results) No results for input(s): PROBNP in the last 8760 hours. CBG: No results for input(s): GLUCAP in the last 168 hours. D-Dimer: No results for input(s): DDIMER in the last 72 hours. Hgb A1c: No results for input(s): HGBA1C in the last 72 hours. Lipid Profile: No results for input(s): CHOL, HDL, LDLCALC, TRIG, CHOLHDL, LDLDIRECT in the last 72  hours. Thyroid function studies: Recent Labs    05/24/18 0103  TSH 2.060   Anemia work up: No results for input(s): VITAMINB12, FOLATE, FERRITIN, TIBC, IRON, RETICCTPCT in the last 72 hours. Sepsis Labs: Recent Labs  Lab 05/23/18 2322 05/24/18 0103 05/24/18 0853  WBC 8.3 9.0 6.8   Microbiology No results found for this or any previous visit (from the past 240 hour(s)).   Medications:   . enoxaparin (LOVENOX) injection  30 mg Subcutaneous Q24H  . feeding supplement (ENSURE ENLIVE)  237 mL Oral BID BM  . fesoterodine  4 mg Oral Daily  . multivitamin with minerals  1 tablet Oral Daily  . pravastatin  40 mg Oral q1800  . sildenafil  20 mg Oral BID  . vitamin B-12  500 mcg Oral Daily   Continuous Infusions:    LOS: 0 days   Charlynne Cousins  [facility]  05/25/2018, 8:29 AM

## 2018-05-25 NOTE — Consult Note (Signed)
CARDIOLOGY CONSULT NOTE  Patient ID: Joyce Oliver MRN: [id number] DOB/AGE: 1924-11-07 85 y.o.  Admit date: 05/23/2018 Referring Physician  Aileen Fass, MD Primary Physician:  Merrilee Seashore, MD Reason for Consultation  Arrhythmias  HPI: Joyce Oliver  is a 85 y.o. female  With History of coronary artery disease and remote stenting in 1999, negative nuclear stress test in 2017, chronic dyspnea on exertion and WHO group to pulmonary hypertension, being treated with PDE 5 inhibitors with resolution of leg edema and right-sided heart failure symptoms, admitted to the hospital with mechanical fall and complains of fatigue and dizziness that started about 3-4 days ago.  On admission and arrival to the EMS, patient found to be in episodes of complete heart block and Mobitz 2 AV block and sinus bradycardia with lowest heart rate recorded at 45 bpm.  Patient was similarly admitted with complete heart block on 10/10/2017 with hyperkalemia, resolution of heart block with correction of hyperkalemia.  He also complains of chest tightness with radiation to her neck and occurred once prior to admission and states that it is a sharp and lasted a few seconds.  No recurrence.  Past Medical History:  Diagnosis Date  . Cancer of breast, female [facility]) 1973   rt mastectomy  . Hypertension      Past Surgical History:  Procedure Laterality Date  . CARDIAC CATHETERIZATION N/A 07/08/2015   Procedure: Right Heart Cath;  Surgeon: Adrian Prows, MD;  Location: [place] [facility];  Service: Cardiovascular;  Laterality: N/A;  . CORONARY ANGIOPLASTY    . EYE SURGERY     Cataract surgery, bilateral lens implant  . hip revison-right  Right   . HIP SURGERY Right   . KNEE ARTHROSCOPY Right   . MASTECTOMY Right   . PERIPHERAL VASCULAR CATHETERIZATION N/A 07/08/2015   Procedure: Upper Extremity Angiography/Subclavion;  Surgeon: Adrian Prows, MD;  Location: [place] [facility];  Service: Cardiovascular;  Laterality: N/A;  .  PERIPHERAL VASCULAR CATHETERIZATION  07/08/2015   Procedure: Peripheral Vascular Intervention;  Surgeon: Adrian Prows, MD;  Location: [place] [facility];  Service: Cardiovascular;;  rt axillary INNOVA [id number]     Family History  Family history unknown: Yes     Social History: Social History   Socioeconomic History  . Marital status: Single    Spouse name: Not on file  . Number of children: Not on file  . Years of education: Not on file  . Highest education level: Not on file  Occupational History  . Not on file  Social Needs  . Financial resource strain: Not on file  . Food insecurity:    Worry: Not on file    Inability: Not on file  . Transportation needs:    Medical: Not on file    Non-medical: Not on file  Tobacco Use  . Smoking status: Never Smoker  . Smokeless tobacco: Never Used  Substance and Sexual Activity  . Alcohol use: No  . Drug use: No  . Sexual activity: Not on file  Lifestyle  . Physical activity:    Days per week: Not on file    Minutes per session: Not on file  . Stress: Not on file  Relationships  . Social connections:    Talks on phone: Not on file    Gets together: Not on file    Attends religious service: Not on file    Active member of club or organization: Not on file    Attends meetings of clubs  or organizations: Not on file    Relationship status: Not on file  . Intimate partner violence:    Fear of current or ex partner: Not on file    Emotionally abused: Not on file    Physically abused: Not on file    Forced sexual activity: Not on file  Other Topics Concern  . Not on file  Social History Narrative  . Not on file     Medications Prior to Admission  Medication Sig Dispense Refill Last Dose  . albuterol (PROVENTIL HFA;VENTOLIN HFA) 108 (90 Base) MCG/ACT inhaler Inhale 2 puffs into the lungs every 4 (four) hours as needed for wheezing or shortness of breath.   10/10/2017 at noon  . budesonide (PULMICORT) 0.5 MG/2ML nebulizer solution  Inhale 0.5 mg into the lungs 4 (four) times daily as needed for shortness of breath.     . budesonide-formoterol (SYMBICORT) 80-4.5 MCG/ACT inhaler Inhale 2 puffs into the lungs 2 (two) times daily.   10/10/2017 at am  . feeding supplement, ENSURE ENLIVE, (ENSURE ENLIVE) LIQD Take 237 mLs by mouth 2 (two) times daily between meals. 237 mL 12   . furosemide (LASIX) 20 MG tablet Take 10 mg by mouth daily.     Marland Kitchen ipratropium-albuterol (DUONEB) 0.5-2.5 (3) MG/3ML SOLN      . lovastatin (MEVACOR) 40 MG tablet Take 0.5 tablets (20 mg total) by mouth daily after supper. 30 tablet 0   . memantine (NAMENDA) 10 MG tablet Take 10 mg by mouth daily.     . metoprolol tartrate (LOPRESSOR) 50 MG tablet Take 50 mg by mouth daily.     . Multiple Vitamin (MULTIVITAMIN WITH MINERALS) TABS tablet Take 1 tablet by mouth daily.   10/10/2017 at am  . pantoprazole (PROTONIX) 40 MG tablet Take 40 mg by mouth daily.     . polyethylene glycol (MIRALAX / GLYCOLAX) packet Take 17 g by mouth daily as needed (constipation). Mix in cup of coffee and drink   10/08/2017  . sildenafil (REVATIO) 20 MG tablet Take 20 mg by mouth 2 (two) times daily.   10/10/2017 at am  . tolterodine (DETROL LA) 2 MG 24 hr capsule Take 1 capsule (2 mg total) by mouth daily after supper. 30 capsule 0   . vitamin B-12 (CYANOCOBALAMIN) 500 MCG tablet Take 500 mcg by mouth daily.   10/10/2017 at am    Review of Systems  Constitutional: Positive for malaise/fatigue. Negative for weight loss.  Respiratory: Positive for shortness of breath. Negative for cough, hemoptysis and sputum production.   Cardiovascular: Negative for chest pain, palpitations, claudication and leg swelling.  Gastrointestinal: Negative for abdominal pain, blood in stool, constipation, heartburn and vomiting.  Genitourinary: Negative for dysuria.  Musculoskeletal: Positive for joint pain. Negative for myalgias.  Neurological: Negative for dizziness, focal weakness and headaches.   Endo/Heme/Allergies: Does not bruise/bleed easily.  Psychiatric/Behavioral: Negative for depression. The patient is not nervous/anxious.   All other systems reviewed and are negative.   Physical Exam: Blood pressure 109/76, pulse 65, temperature 97.8 F (36.6 C), temperature source Oral, resp. rate (!) 24, height 5\' 3"  (1.6 m), weight 48.6 kg, SpO2 98 %.  Physical Exam  Constitutional: No distress.  Petite, frail  HENT:  Head: Atraumatic.  Eyes: Conjunctivae are normal.  Neck: Neck supple. No JVD present. No thyromegaly present.  Cardiovascular: Normal rate, regular rhythm, normal heart sounds and intact distal pulses. Exam reveals no gallop.  No murmur heard. Pulses:      Carotid  pulses are 2+ on the right side and 2+ on the left side.      Dorsalis pedis pulses are 1+ on the right side and 1+ on the left side.       Posterior tibial pulses are 1+ on the right side and 1+ on the left side.  Pulmonary/Chest: Effort normal. She has wheezes (bilateral diffuse and scattered).  Abdominal: Soft. Bowel sounds are normal.  Musculoskeletal: Normal range of motion.        General: No edema.  Neurological: She is alert.  Skin: Skin is warm and dry.  Psychiatric: She has a normal mood and affect.   Labs:  BNP (last 3 results) No results for input(s): BNP in the last 8760 hours.  Cardiac Panel (last 3 results) Recent Labs    05/24/18 0103 05/24/18 0739 05/24/18 1239  TROPONINI <0.03 <0.03 <0.03     CMP Latest Ref Rng & Units 05/24/2018 05/24/2018 05/23/2018  Glucose 70 - 99 mg/dL 82 - 104(H)  BUN 8 - 23 mg/dL 25(H) - 26(H)  Creatinine 0.44 - 1.00 mg/dL 1.13(H) 1.34(H) 1.24(H)  Sodium 135 - 145 mmol/L 142 - 143  Potassium 3.5 - 5.1 mmol/L 4.5 - 4.7  Chloride 98 - 111 mmol/L 114(H) - 108  CO2 22 - 32 mmol/L 19(L) - 23  Calcium 8.9 - 10.3 mg/dL 8.5(L) - 9.3  Total Protein 6.5 - 8.1 g/dL - - 5.9(L)  Total Bilirubin 0.3 - 1.2 mg/dL - - 1.0  Alkaline Phos 38 - 126 U/L - - 60   AST 15 - 41 U/L - - 20  ALT 0 - 44 U/L - - 13     CBC Latest Ref Rng & Units 05/24/2018 05/24/2018 05/23/2018  WBC 4.0 - 10.5 K/uL 6.8 9.0 8.3  Hemoglobin 12.0 - 15.0 g/dL 10.2(L) 11.0(L) 11.0(L)  Hematocrit 36.0 - 46.0 % 32.1(L) 34.9(L) 35.7(L)  Platelets 150 - 400 K/uL 153 151 160      Lipid Panel  No results found for: CHOL, TRIG, HDL, CHOLHDL, VLDL, LDLCALC, LDLDIRECT   BNP (last 3 results) No results for input(s): BNP in the last 8760 hours.   HEMOGLOBIN A1C No results found for: HGBA1C, MPG  BMP Latest Ref Rng & Units 05/24/2018 05/24/2018 05/23/2018  Glucose 70 - 99 mg/dL 82 - 104(H)  BUN 8 - 23 mg/dL 25(H) - 26(H)  Creatinine 0.44 - 1.00 mg/dL 1.13(H) 1.34(H) 1.24(H)  Sodium 135 - 145 mmol/L 142 - 143  Potassium 3.5 - 5.1 mmol/L 4.5 - 4.7  Chloride 98 - 111 mmol/L 114(H) - 108  CO2 22 - 32 mmol/L 19(L) - 23  Calcium 8.9 - 10.3 mg/dL 8.5(L) - 9.3     TSH Recent Labs    10/10/17 1722 05/24/18 0103  TSH 2.029 2.060     Radiology: Dg Pelvis 1-2 Views  Result Date: 05/24/2018 CLINICAL DATA:  Fall, history hypertension and breast cancer EXAM: PELVIS - 1-2 VIEW COMPARISON:  03/05/2008 FINDINGS: Marked osseous demineralization. RIGHT hip prosthesis. Advanced osteoarthritic changes of the LEFT hip joint with joint space narrowing, spur formation, bone-on-bone appearance and subchondral sclerosis. Question old healed fracture of the RIGHT inferior pubic ramus. Distal aspect of the hip prosthesis is not imaged. No acute fracture, dislocation or bone destruction. IMPRESSION: Osseous demineralization with prior RIGHT hip arthroplasty and advanced osteoarthritic changes of the LEFT hip joint. Question old healed fracture RIGHT inferior pubic ramus. No definite acute osseous abnormalities. Electronically Signed   By: Crist Infante.D.  On: 05/24/2018 10:01   Dg Chest Port 1 View  Result Date: 05/23/2018 CLINICAL DATA:  Shortness of breath. EXAM: PORTABLE CHEST 1 VIEW  COMPARISON:  Radiograph 10/10/2017, CT 06/17/2013 FINDINGS: Unchanged heart size and mediastinal contours with aortic atherosclerosis and mild cardiomegaly. No pulmonary edema. No acute airspace disease, large pleural effusion or pneumothorax. Right apical pleuroparenchymal scarring appears similar to prior exam. Vascular stent in the right subclavian. Bones are under mineralized. Remote left rib fractures. IMPRESSION: 1. No acute cardiopulmonary abnormality. Right apical scarring is unchanged from prior. 2.  Aortic Atherosclerosis (ICD10-I70.0). Electronically Signed   By: Keith Rake M.D.   On: 05/23/2018 23:43   Dg Foot 2 Views Left  Result Date: 05/24/2018 CLINICAL DATA:  Tenderness at top of LEFT foot for few days post fall EXAM: LEFT FOOT - 2 VIEW COMPARISON:  None FINDINGS: Diffuse osseous demineralization. Question subjective pes cavus. Joint spaces preserved. No acute fracture, dislocation, or bone destruction. IMPRESSION: No acute abnormalities. Electronically Signed   By: Lavonia Dana M.D.   On: 05/24/2018 09:59    Scheduled Meds: . enoxaparin (LOVENOX) injection  30 mg Subcutaneous Q24H  . feeding supplement (ENSURE ENLIVE)  237 mL Oral BID BM  . fesoterodine  4 mg Oral Daily  . multivitamin with minerals  1 tablet Oral Daily  . pravastatin  40 mg Oral q1800  . sildenafil  20 mg Oral BID  . vitamin B-12  500 mcg Oral Daily   Continuous Infusions: PRN Meds:.acetaminophen **OR** acetaminophen, albuterol, ondansetron **OR** ondansetron (ZOFRAN) IV, polyethylene glycol  CARDIAC STUDIES: 07/08/2015: Right heart catheterization, arch aortogram, selective right subclavian arteriogram and right axillary artery angioplasty. 1. Mild pulmonary hypertension with preserved cardiac output and cardiac index by 4.37 and 2.91. PA pressure 37/15/mean 23 mmHg. Findings consistent with probable primary pulmonary hypertension, pulmonary capillary wedge was 11 mmHg. 2. 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  3. 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 Right subdural artery shows mild diffuse disease. 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 4. Successful PTA and stenting of the right axillary artery with implantation of a 5.0 x 18 mm Innova self-expanding stent to the right axillary artery, stenosis reduced from 100% to 0% (CTO).  EKG 10/10/2017: Complete heart block with junctional escape rhythm at the rate of 28 bpm.  No evidence of ischemia, tall hyperacute T waves noted suggestive of hyperkalemia.  EKG 06/24/2018: NSR Normal axis, no ischemia. Episodes of Compete heart block with junctional escape   Tele: Complete heart block paroxysms, episodes of sinus arrest, longest 4.3 seconds, occasional PVC.   Office ECHO: 04/13/2017: Normal LV size, septal flattening suggestive of right-sided pressure overload pattern.  Diastolic function could not be assessed.  EF calculated at 57%.  Mild MR, moderate to severe TR, PA pressure 45 mmHg.  Echocardiogram 10/11/2017:  Hyperdynamic LV, EF 65-70%.  Grade 1 diastolic dysfunction with elevated left atrial pressure.  Severely calcified aortic valve and mitral and the calcification without stenosis in the aortic valve and trace stenosis in the mitral valve.  Small free-floating pericardial effusion.  Moderate pulmonary hypertension, PA pressure 49 mmHg.   ASSESSMENT AND PLAN:  1.  Sinus node dysfunction and high degree AV block/third-degree.  Symptomatic. 2.  Coronary artery disease with remote stenting with no recurrence of angina pectoris, cardiac markers are negative for myocardial injury.  One episode of chest pain most probably  musculoskeletal. 3.  Shortness of breath and dyspnea on exertion, patient has WHO group 2 pulmonary hypertension, has responded well with complete resolution of right-sided heart failure symptoms  since being on sildenafil 4.  Bronchospasm  Recommendation: I have discussed with Dr. Curt Bears regarding her presentation, she will need permanent pacemaker implantation.  She has been kept n.p.o. for today. She has history of bronchial asthma, she is actively wheezing today but not severe.  We will give her 1 round of albuterol aerosol inhaler.  We will hold off on furosemide for this morning.  Once pacemaker is done, her final disposition is per EP if they agree.  Thank you for the consultation. I have left a message for Dannette Barbara, [phone] her niece who has power of attorney for health, who knows me well.  I have personally  discussed the findings with her nephew Jiselle Sheu   Adrian Prows, MD, [facility] 05/25/2018, 5:58 AM [facility] [place] Pager: [phone] Office: [phone] If no answer Cell [phone]

## 2018-05-25 NOTE — Consult Note (Addendum)
Cardiology Consultation:   Patient ID: Joyce Oliver MRN: [id number]; DOB: 08/29/1924  Admit date: 05/23/2018 Date of Consult: 05/25/2018  Primary Care Provider: Merrilee Seashore, MD Primary Cardiologist: Dr. Einar Gip Primary Electrophysiologist:  None   Patient Profile:   Joyce Oliver is a 85 y.o. female with a hx of  CAD (remote PCI 1999,negative nuclear stress test in 2017), HTN, chronic SOB with p.HTN , HTN who is being seen today for the evaluation of CHBat the request of Dr. Einar Gip.  History of Present Illness:   Ms. [place]ad in review of her record and d/w Dr. Einar Gip was admitted Aug 2019 with CHB though felt 2/2 hypokalemia and resolved with correction of her K+.  She is admitted now again with worsening weakness, fatigue of about 3-4 days, and what is described as a mechanical fall.  EMS found her in CHB and Mobitz II,  with rates 40's.  Bp stable.  EP is asked to evaluate for PPM.  The patient reports that she lives at her own home with the aid of a daily care giver, and help particularly as well from her niece nancy who is her POA. The patient denies any kind of CP or SOB at home or here, she can not completely recall exactly why they called 911.  She does say that she had a fall though is certain this was a slip in the bathroom and she was able to control it without getting hurt.  The patient's niece reports that the patient was having recurrent weak, dizzy spells, and when they called EMS they reports that her HR was slowing/stopping and likely was the cause There are some mentions of CP as well, the patient denies this  LABS K+ 4.7, 4.5 BUN/Creat 25/1.13 Mag 1.9 Trop I <0.03 (x4) WBC 6.8 H/H 10/32 Plts 153 TSH 2.068  Home meds are reviewed and include lopressor 50mg  daily, I do not see any other potentially contributing meds In d/w [place] the patient's medicines, and she reports that the patient has not been taking metoprolol for quite a long time.   Past Medical  History:  Diagnosis Date  . Cancer of breast, female [facility]) 1973   rt mastectomy  . Hypertension     Past Surgical History:  Procedure Laterality Date  . CARDIAC CATHETERIZATION N/A 07/08/2015   Procedure: Right Heart Cath;  Surgeon: Adrian Prows, MD;  Location: [place] [facility];  Service: Cardiovascular;  Laterality: N/A;  . CORONARY ANGIOPLASTY    . EYE SURGERY     Cataract surgery, bilateral lens implant  . hip revison-right  Right   . HIP SURGERY Right   . KNEE ARTHROSCOPY Right   . MASTECTOMY Right   . PERIPHERAL VASCULAR CATHETERIZATION N/A 07/08/2015   Procedure: Upper Extremity Angiography/Subclavion;  Surgeon: Adrian Prows, MD;  Location: [place] [facility];  Service: Cardiovascular;  Laterality: N/A;  . PERIPHERAL VASCULAR CATHETERIZATION  07/08/2015   Procedure: Peripheral Vascular Intervention;  Surgeon: Adrian Prows, MD;  Location: [place] [facility];  Service: Cardiovascular;;  rt axillary INNOVA [id number]     Home Medications:  Prior to Admission medications   Medication Sig Start Date End Date Taking? Authorizing Provider  albuterol (PROVENTIL HFA;VENTOLIN HFA) 108 (90 Base) MCG/ACT inhaler Inhale 2 puffs into the lungs every 4 (four) hours as needed for wheezing or shortness of breath.    [provider]  budesonide (PULMICORT) 0.5 MG/2ML nebulizer solution Inhale 0.5 mg into the lungs 4 (four) times daily as needed  for shortness of breath. 04/26/18   [provider]  budesonide-formoterol (SYMBICORT) 80-4.5 MCG/ACT inhaler Inhale 2 puffs into the lungs 2 (two) times daily.    [provider]  feeding supplement, ENSURE ENLIVE, (ENSURE ENLIVE) LIQD Take 237 mLs by mouth 2 (two) times daily between meals. 10/13/17   Bonnell Public, MD  furosemide (LASIX) 20 MG tablet Take 10 mg by mouth daily. 05/15/18   [provider]  ipratropium-albuterol (DUONEB) 0.5-2.5 (3) MG/3ML SOLN  04/26/18   [provider]  lovastatin (MEVACOR) 40 MG tablet Take  0.5 tablets (20 mg total) by mouth daily after supper. 10/13/17   Dana Allan I, MD  memantine (NAMENDA) 10 MG tablet Take 10 mg by mouth daily. 03/29/18   [provider]  metoprolol tartrate (LOPRESSOR) 50 MG tablet Take 50 mg by mouth daily. 03/29/18   [provider]  Multiple Vitamin (MULTIVITAMIN WITH MINERALS) TABS tablet Take 1 tablet by mouth daily.    [provider]  pantoprazole (PROTONIX) 40 MG tablet Take 40 mg by mouth daily. 03/29/18   [provider]  polyethylene glycol (MIRALAX / GLYCOLAX) packet Take 17 g by mouth daily as needed (constipation). Mix in cup of coffee and drink    [provider]  sildenafil (REVATIO) 20 MG tablet Take 20 mg by mouth 2 (two) times daily. 07/26/17   [provider]  tolterodine (DETROL LA) 2 MG 24 hr capsule Take 1 capsule (2 mg total) by mouth daily after supper. 10/13/17   Bonnell Public, MD  vitamin B-12 (CYANOCOBALAMIN) 500 MCG tablet Take 500 mcg by mouth daily.    [provider]    Inpatient Medications: Scheduled Meds: . albuterol  2.5 mg Nebulization Once  . enoxaparin (LOVENOX) injection  30 mg Subcutaneous Q24H  . feeding supplement (ENSURE ENLIVE)  237 mL Oral BID BM  . fesoterodine  4 mg Oral Daily  . multivitamin with minerals  1 tablet Oral Daily  . pravastatin  40 mg Oral q1800  . sildenafil  20 mg Oral BID  . vitamin B-12  500 mcg Oral Daily   Continuous Infusions: . dextrose 5 % and 0.45% NaCl 75 mL/hr at 05/25/18 0958   PRN Meds: acetaminophen **OR** acetaminophen, albuterol, ondansetron **OR** ondansetron (ZOFRAN) IV, polyethylene glycol  Allergies:    Allergies  Allergen Reactions  . Penicillins Other (See Comments)    Has patient had a PCN reaction causing immediate rash, facial/tongue/throat swelling, SOB or lightheadedness with hypotension: Unknown Has patient had a PCN reaction causing severe rash involving mucus membranes or skin necrosis:  No Has patient had a PCN reaction that required hospitalization No Has patient had a PCN reaction occurring within the last 10 years: No If all of the above answers are "NO", then may proceed with Cephalosporin use.    . Codeine Nausea And Vomiting  . Morphine And Related Nausea And Vomiting    Social History:   Social History   Socioeconomic History  . Marital status: Single    Spouse name: Not on file  . Number of children: Not on file  . Years of education: Not on file  . Highest education level: Not on file  Occupational History  . Not on file  Social Needs  . Financial resource strain: Not on file  . Food insecurity:    Worry: Not on file    Inability: Not on file  . Transportation needs:    Medical: Not on file  Non-medical: Not on file  Tobacco Use  . Smoking status: Never Smoker  . Smokeless tobacco: Never Used  Substance and Sexual Activity  . Alcohol use: No  . Drug use: No  . Sexual activity: Not on file  Lifestyle  . Physical activity:    Days per week: Not on file    Minutes per session: Not on file  . Stress: Not on file  Relationships  . Social connections:    Talks on phone: Not on file    Gets together: Not on file    Attends religious service: Not on file    Active member of club or organization: Not on file    Attends meetings of clubs or organizations: Not on file    Relationship status: Not on file  . Intimate partner violence:    Fear of current or ex partner: Not on file    Emotionally abused: Not on file    Physically abused: Not on file    Forced sexual activity: Not on file  Other Topics Concern  . Not on file  Social History Narrative  . Not on file    Family History:   Family History  Family history unknown: Yes     ROS:  Please see the history of present illness.  All other ROS reviewed and negative.     Physical Exam/Data:   Vitals:   05/24/18 1325 05/24/18 1958 05/25/18 0424 05/25/18 0910  BP: 138/78 140/69  109/76 (!) 152/79  Pulse: 70 60 65 68  Resp: 20 17 (!) 24 20  Temp: 97.6 F (36.4 C) 97.7 F (36.5 C) 97.8 F (36.6 C)   TempSrc: Oral Oral Oral   SpO2: 90% 96% 98% 92%  Weight:   48.6 kg   Height:        Intake/Output Summary (Last 24 hours) at 05/25/2018 1032 Last data filed at 05/25/2018 0916 Gross per 24 hour  Intake 480 ml  Output 800 ml  Net -320 ml   Last 3 Weights 05/25/2018 05/23/2018 10/12/2017  Weight (lbs) 107 lb 3.2 oz 110 lb 96 lb 1.9 oz  Weight (kg) 48.626 kg 49.896 kg 43.6 kg     Body mass index is 18.99 kg/m.  General:  Well nourished, well developed, in no acute distress HEENT: normal Lymph: no adenopathy Neck: no JVD Endocrine:  No thryomegaly Vascular: No carotid bruits  Cardiac: RRR; no murmurs, gallops or rubs Lungs:  clear to auscultation bilaterally, no wheezing, rhonchi or rales  Abd: soft, nontender, no hepatomegaly  Ext: no edema Musculoskeletal:  No deformities, BUE and BLE strength normal and equal Skin: warm and dry  Neuro:  CNs 2-12 intact, no focal abnormalities noted Psych:  Normal affect   EKG:  The EKG was personally reviewed and demonstrates:   SR, 69bpm Telemetry:  Telemetry was personally reviewed and demonstrates:   SR, intermittently and early in her stay she had Mobitz II with transient CHB as well, and periods of CHB w/V pauses of up to 4.5 seconds The last episode appears to have been about 0100 this AM  Relevant CV Studies:  10/11/17: TTE Study Conclusions - Left ventricle: The cavity size was normal. Systolic function was   vigorous. The estimated ejection fraction was in the range of 65%   to 70%. Wall motion was normal; there were no regional wall   motion abnormalities. There was an increased relative   contribution of atrial contraction to ventricular filling.   Doppler parameters are consistent  with abnormal left ventricular   relaxation (grade 1 diastolic dysfunction). Doppler parameters   are consistent with high  ventricular filling pressure. - Aortic valve: Severely calcified annulus. Trileaflet; moderately   thickened, moderately calcified leaflets. Valve area (VTI): 0.85   cm^2. Valve area (Vmax): 0.71 cm^2. Valve area (Vmean): 0.68   cm^2. - Mitral valve: Severely calcified annulus. The findings are   consistent with mild stenosis. There was mild regurgitation.   Valve area by pressure half-time: 1.67 cm^2. - Pulmonary arteries: PA peak pressure: 49 mm Hg (S). - Pericardium, extracardiac: A small, free-flowing pericardial   effusion was identified along the right ventricular free wall.   The fluid had no internal echoes. Impressions: - The right ventricular systolic pressure was increased consistent   with moderate pulmonary hypertension.  Laboratory Data:  Chemistry Recent Labs  Lab 05/23/18 2322 05/24/18 0103 05/24/18 0739  NA 143  --  142  K 4.7  --  4.5  CL 108  --  114*  CO2 23  --  19*  GLUCOSE 104*  --  82  BUN 26*  --  25*  CREATININE 1.24* 1.34* 1.13*  CALCIUM 9.3  --  8.5*  GFRNONAA 38* 35* 42*  GFRAA 44* 40* 49*  ANIONGAP 12  --  9    Recent Labs  Lab 05/23/18 2322  PROT 5.9*  ALBUMIN 3.2*  AST 20  ALT 13  ALKPHOS 60  BILITOT 1.0   Hematology Recent Labs  Lab 05/23/18 2322 05/24/18 0103 05/24/18 0853  WBC 8.3 9.0 6.8  RBC 3.57* 3.49* 3.24*  HGB 11.0* 11.0* 10.2*  HCT 35.7* 34.9* 32.1*  MCV 100.0 100.0 99.1  MCH 30.8 31.5 31.5  MCHC 30.8 31.5 31.8  RDW 13.7 13.8 13.6  PLT 160 151 153   Cardiac Enzymes Recent Labs  Lab 05/23/18 2322 05/24/18 0103 05/24/18 0739 05/24/18 1239  TROPONINI <0.03 <0.03 <0.03 <0.03   No results for input(s): TROPIPOC in the last 168 hours.  BNPNo results for input(s): BNP, PROBNP in the last 168 hours.  DDimer No results for input(s): DDIMER in the last 168 hours.  Radiology/Studies:   Dg Pelvis 1-2 Views Result Date: 05/24/2018 CLINICAL DATA:  Fall, history hypertension and breast cancer EXAM: PELVIS - 1-2  VIEW COMPARISON:  03/05/2008 FINDINGS: Marked osseous demineralization. RIGHT hip prosthesis. Advanced osteoarthritic changes of the LEFT hip joint with joint space narrowing, spur formation, bone-on-bone appearance and subchondral sclerosis. Question old healed fracture of the RIGHT inferior pubic ramus. Distal aspect of the hip prosthesis is not imaged. No acute fracture, dislocation or bone destruction. IMPRESSION: Osseous demineralization with prior RIGHT hip arthroplasty and advanced osteoarthritic changes of the LEFT hip joint. Question old healed fracture RIGHT inferior pubic ramus. No definite acute osseous abnormalities. Electronically Signed   By: Lavonia Dana M.D.   On: 05/24/2018 10:01   Dg Chest Port 1 View Result Date: 05/23/2018 CLINICAL DATA:  Shortness of breath. EXAM: PORTABLE CHEST 1 VIEW COMPARISON:  Radiograph 10/10/2017, CT 06/17/2013 FINDINGS: Unchanged heart size and mediastinal contours with aortic atherosclerosis and mild cardiomegaly. No pulmonary edema. No acute airspace disease, large pleural effusion or pneumothorax. Right apical pleuroparenchymal scarring appears similar to prior exam. Vascular stent in the right subclavian. Bones are under mineralized. Remote left rib fractures. IMPRESSION: 1. No acute cardiopulmonary abnormality. Right apical scarring is unchanged from prior. 2.  Aortic Atherosclerosis (ICD10-I70.0). Electronically Signed   By: Keith Rake M.D.   On: 05/23/2018 23:43  Dg Foot 2 Views Left Result Date: 05/24/2018 CLINICAL DATA:  Tenderness at top of LEFT foot for few days post fall EXAM: LEFT FOOT - 2 VIEW COMPARISON:  None FINDINGS: Diffuse osseous demineralization. Question subjective pes cavus. Joint spaces preserved. No acute fracture, dislocation, or bone destruction. IMPRESSION: No acute abnormalities. Electronically Signed   By: Lavonia Dana M.D.   On: 05/24/2018 09:59    Assessment and Plan:   1. Weakess, fall (reported as mechanical)       Dizzy spells     Advanced heart block Mobitz II as well as CHB with pauses of up to 4-5 seconds     Prior h/o advanced heart block as well, suspect to be 2/2 hypokalemia   Lopressor is on her home list of medicines though the family states she has not been on this for quite a long time. No other potentially reversible causes are noted  The patient asks that we involve her niece Dannette Barbara on her care and plans.  Dr. Curt Bears has seen and examined the patient, discussed with the patient and her niece [name], [place]) rational for PPM recommendation, the procedure, potential risks and benefits.  They are both agreeable to proceed.         For questions or updates, please contact [place] Please consult [email or web address] for contact info under     Signed, Baldwin Jamaica, PA-C  05/25/2018 10:32 AM  I have seen and examined this patient with Tommye Standard.  Agree with above, note added to reflect my findings.  On exam, RRR, no murmurs, lungs clear.  Patient admitted to the hospital with episodes of bradycardia found by EMS.  EMS was called as the patient was having chest discomfort.  Review of telemetry shows sinus rhythm with up to 4.9-second pauses as well as intermittent episodes of 2-1 AV block and complete heart block.  Due to that, we Kalix Meinecke plan for pacemaker implant today.  ELINE GENG has presented today for surgery, with the diagnosis of complete heart block.  The various methods of treatment have been discussed with the patient and family. After consideration of risks, benefits and other options for treatment, the patient has consented to  Procedure(s): Pacemaker implant as a surgical intervention .  Risks include but not limited to bleeding, tamponade, infection, pneumothorax, among others. The patient's history has been reviewed, patient examined, no change in status, stable for surgery.  I have reviewed the patient's chart and labs.  Questions were answered to the patient's  satisfaction.    Khali Perella Curt Bears, MD 05/25/2018 11:41 AM    Liala Codispoti M. Sora Olivo MD 05/25/2018 11:40 AM

## 2018-05-26 ENCOUNTER — Encounter (HOSPITAL_COMMUNITY): Payer: Self-pay | Admitting: Cardiology

## 2018-05-26 ENCOUNTER — Inpatient Hospital Stay (HOSPITAL_COMMUNITY): Payer: Medicare Other

## 2018-05-26 MED FILL — Lidocaine HCl Local Inj 1%: INTRAMUSCULAR | Qty: 20 | Status: AC

## 2018-05-26 NOTE — Progress Notes (Addendum)
Progress Note  Patient Name: Joyce Oliver Date of Encounter: 05/26/2018  Primary Cardiologist: Dr. Einar Gip  Subjective   No complaints of CP or SOB, denies pain at [facility] site  Inpatient Medications    Scheduled Meds: . enoxaparin (LOVENOX) injection  30 mg Subcutaneous Q24H  . feeding supplement (ENSURE ENLIVE)  237 mL Oral BID BM  . fesoterodine  4 mg Oral Daily  . multivitamin with minerals  1 tablet Oral Daily  . pravastatin  40 mg Oral q1800  . sildenafil  20 mg Oral BID  . vitamin B-12  500 mcg Oral Daily   Continuous Infusions: . dextrose 5 % and 0.45% NaCl 75 mL/hr at 05/26/18 0200   PRN Meds: acetaminophen **OR** acetaminophen, albuterol, ondansetron **OR** ondansetron (ZOFRAN) IV, polyethylene glycol   Vital Signs    Vitals:   05/25/18 1509 05/25/18 1751 05/25/18 2222 05/26/18 0433  BP: 125/88  (!) 155/75 (!) 129/95  Pulse: 77 80 79 81  Resp: (!) 27 (!) 23 17 18   Temp:   97.7 F (36.5 C) 98.7 F (37.1 C)  TempSrc:   Oral Oral  SpO2: 96% 98% 95% 99%  Weight:    49.5 kg  Height:        Intake/Output Summary (Last 24 hours) at 05/26/2018 0825 Last data filed at 05/26/2018 0973 Gross per 24 hour  Intake 789.48 ml  Output 1700 ml  Net -910.52 ml   Last 3 Weights 05/26/2018 05/25/2018 05/23/2018  Weight (lbs) 109 lb 1.6 oz 107 lb 3.2 oz 110 lb  Weight (kg) 49.487 kg 48.626 kg 49.896 kg      Telemetry    SR, occ PVCs - Personally Reviewed  ECG    SR - Personally Reviewed  Physical Exam   GEN: No acute distress.   Neck: No JVD Cardiac: RRR, no murmurs, rubs, or gallops.  Respiratory: CTA b/l. GI: Soft, nontender, non-distended  MS: No edema; No deformity. Neuro:  Nonfocal  Psych: Normal affect   PPM site is dry, no hematoma  Labs    Chemistry Recent Labs  Lab 05/23/18 2322 05/24/18 0103 05/24/18 0739  NA 143  --  142  K 4.7  --  4.5  CL 108  --  114*  CO2 23  --  19*  GLUCOSE 104*  --  82  BUN 26*  --  25*  CREATININE 1.24* 1.34*  1.13*  CALCIUM 9.3  --  8.5*  PROT 5.9*  --   --   ALBUMIN 3.2*  --   --   AST 20  --   --   ALT 13  --   --   ALKPHOS 60  --   --   BILITOT 1.0  --   --   GFRNONAA 38* 35* 42*  GFRAA 44* 40* 49*  ANIONGAP 12  --  9     Hematology Recent Labs  Lab 05/23/18 2322 05/24/18 0103 05/24/18 0853  WBC 8.3 9.0 6.8  RBC 3.57* 3.49* 3.24*  HGB 11.0* 11.0* 10.2*  HCT 35.7* 34.9* 32.1*  MCV 100.0 100.0 99.1  MCH 30.8 31.5 31.5  MCHC 30.8 31.5 31.8  RDW 13.7 13.8 13.6  PLT 160 151 153    Cardiac Enzymes Recent Labs  Lab 05/23/18 2322 05/24/18 0103 05/24/18 0739 05/24/18 1239  TROPONINI <0.03 <0.03 <0.03 <0.03   No results for input(s): TROPIPOC in the last 168 hours.   BNPNo results for input(s): BNP, PROBNP in the last 168 hours.  DDimer No results for input(s): DDIMER in the last 168 hours.   Radiology    Dg Chest 2 View Result Date: 05/26/2018 CLINICAL DATA:  Status post pacemaker placement EXAM: CHEST - 2 VIEW COMPARISON:  May 23, 2018 FINDINGS: There is a pacemaker on the left. Lead tips are attached to the right atrium and right ventricle. No pneumothorax evident. There is scarring in the right apex, stable. There is no edema or consolidation. Heart is upper normal in size with pulmonary vascularity normal. No adenopathy. There is aortic atherosclerosis. There is a stent in the right axillary region. Bones are osteoporotic. IMPRESSION: Pacemaker present with lead tips attached to right atrium and right ventricle. No pneumothorax. Stable cardiac silhouette. No lung edema or consolidation. Stable right apical scarring. Aortic Atherosclerosis (ICD10-I70.0). Electronically Signed   By: Lowella Grip III M.D.   On: 05/26/2018 07:16      Cardiac Studies   10/11/17: TTE Study Conclusions - Left ventricle: The cavity size was normal. Systolic function was vigorous. The estimated ejection fraction was in the range of 65% to 70%. Wall motion was normal; there were no  regional wall motion abnormalities. There was an increased relative contribution of atrial contraction to ventricular filling. Doppler parameters are consistent with abnormal left ventricular relaxation (grade 1 diastolic dysfunction). Doppler parameters are consistent with high ventricular filling pressure. - Aortic valve: Severely calcified annulus. Trileaflet; moderately thickened, moderately calcified leaflets. Valve area (VTI): 0.85 cm^2. Valve area (Vmax): 0.71 cm^2. Valve area (Vmean): 0.68 cm^2. - Mitral valve: Severely calcified annulus. The findings are consistent with mild stenosis. There was mild regurgitation. Valve area by pressure half-time: 1.67 cm^2. - Pulmonary arteries: PA peak pressure: 49 mm Hg (S). - Pericardium, extracardiac: A small, free-flowing pericardial effusion was identified along the right ventricular free wall. The fluid had no internal echoes. Impressions: - The right ventricular systolic pressure was increased consistent with moderate pulmonary hypertension.   Patient Profile     85 y.o. female  with a hx of  CAD (remote PCI 1999,negative nuclear stress test in 2017), HTN, chronic SOB with p.HTN , HTN admitted with new onset and recurrent dizzy spells, weak spells, and what is described as a mechanical fall, found to have Mobitz II and intermittent CHB with pauses 4-5 seconds  The patient's home list of medicine listed lopressor though the patient's niece who is primary contact/POA confirmed that she was not actively taking and had been stopped quite some time ago.  No other potentially reversible causes were noted and she is not s/p PPM implant  Assessment & Plan    1. Advanced heart block including CHB     S/p PPM yesterday  CXR this AM without ptx Device transmission is completed. Site is stable  Wound care and activty restrictions were discussed with the patient as well as her niece [place] via telephone I did  transmitter education with [name] as well.  We discussed E-visit for her wound check though she did not feel they would be able to.    Routine post PPM implant follow up is in place.  EP Mishelle Hassan sign off though remain available, please recall if needed. No new medication recommendations AGAIN note that lopressor is on her home list of medicines though reportedly has not been taking for quite some time.   Final medicines on discharge deferred primary team  Further management as per Dr. Einar Gip and IM service    For questions or updates, please contact [place] Please consult  [email or web address] for contact info under        Signed, Baldwin [place], PA-C  05/26/2018, 8:25 AM    I have seen and examined this patient with Tommye Standard.  Agree with above, note added to reflect my findings.  On exam, RRR, no murmurs, lungs clear.  She is now status post [employer] dual-chamber pacemaker for intermittent complete heart block.  Device functioning appropriately.  Chest x-ray without issue.  Okay for discharge today per EP perspective.  We Allie Gerhold plan follow-up in device clinic.  Kerry Odonohue M. Icey Tello MD 05/26/2018 11:19 AM

## 2018-05-26 NOTE — Evaluation (Signed)
Physical Therapy Evaluation Patient Details Name: Joyce Oliver MRN: [id number] DOB: [birth year] Today's Date: 05/26/2018   History of Present Illness  Pt is a 85 y.o. F with significant PMH of remote CAD snd stent, chronic diastolic heart failure, pulmonary hypertension, who presents with dizziness accompanied with chest pain and found to have an intermittent third degree block. S/p PPM 3/26  Clinical Impression  Pt admitted with above diagnosis. Pt currently with functional limitations due to the deficits listed below (see PT Problem List). Prior to admission, pt has 24 hour caregiver and is a limited household ambulator using a walker. On PT evaluation, pt ambulating 75 feet with min guard assist. Pt fatiguing easily, DOE 2/4, but SpO2 > 90% on RA. Pt does present as high fall risk based on decreased gait speed and history of falls. Verbally reviewed pacemaker precautions with pt but will likely need reinforcement due to noted short term memory deficits. Recommend HHPT to maximize functional independence.     Follow Up Recommendations Home health PT;Supervision/Assistance - 24 hour    Equipment Recommendations  None recommended by PT    Recommendations for Other Services       Precautions / Restrictions Precautions Precautions: Fall;ICD/Pacemaker Precaution Comments: verbally reviewed Restrictions Weight Bearing Restrictions: No      Mobility  Bed Mobility               General bed mobility comments: OOB in chair  Transfers Overall transfer level: Needs assistance Equipment used: Rolling walker (2 wheeled) Transfers: Sit to/from Stand Sit to Stand: Min assist         General transfer comment: Light min assist to stand with hand over hand guidance for proper hand placement  Ambulation/Gait Ambulation/Gait assistance: Min guard Gait Distance (Feet): 75 Feet Assistive device: Rolling walker (2 wheeled) Gait Pattern/deviations: Step-through pattern;Decreased stride  length;Trunk flexed Gait velocity: decr Gait velocity interpretation: <1.31 ft/sec, indicative of household ambulator General Gait Details: Pt with slow speed, no overt LOB but fatiguing easily  Financial trader [employer] (Stroke Patients Only)       Balance Overall balance assessment: Needs assistance Sitting-balance support: Feet supported Sitting balance-Leahy Scale: Good     Standing balance support: Bilateral upper extremity supported Standing balance-Leahy Scale: Poor Standing balance comment: reliant on external support                             Pertinent Vitals/Pain Pain Assessment: Faces Faces Pain Scale: No hurt    Home Living Family/patient expects to be discharged to:: Private residence Living Arrangements: Non-relatives/Friends(24 hour caregiver in home) Available Help at Discharge: Available 24 hours/day Type of Home: House Home Access: Level entry     Home Layout: One level Home Equipment: Walker - 4 wheels;Walker - 2 wheels;Shower seat;Hand held shower head      Prior Function Level of Independence: Needs assistance   Gait / Transfers Assistance Needed: household ambulation with RW  ADL's / Homemaking Assistance Needed: caregivers with ADLs/IADL's        Hand Dominance        Extremity/Trunk Assessment   Upper Extremity Assessment Upper Extremity Assessment: Generalized weakness    Lower Extremity Assessment Lower Extremity Assessment: Generalized weakness    Cervical / Trunk Assessment Cervical / Trunk Assessment: Kyphotic  Communication   Communication: HOH  Cognition Arousal/Alertness: Awake/alert Behavior During Therapy: [facility]  for tasks assessed/performed Overall Cognitive Status: No family/caregiver present to determine baseline cognitive functioning                                 General Comments: Decreased short term memory noted      General Comments       Exercises     Assessment/Plan    PT Assessment Patient needs continued PT services  PT Problem List Decreased strength;Decreased activity tolerance;Decreased balance;Decreased mobility;Decreased knowledge of precautions       PT Treatment Interventions DME instruction;Gait training;Therapeutic activities;Therapeutic exercise;Balance training;Patient/family education;Functional mobility training    PT Goals (Current goals can be found in the Care Plan section)  Acute Rehab PT Goals Patient Stated Goal: none stated; agreeable to therapy PT Goal Formulation: With patient Time For Goal Achievement: 06/09/18 Potential to Achieve Goals: Good    Frequency Min 3X/week   Barriers to discharge        Co-evaluation               AM-PAC PT "6 Clicks" Mobility  Outcome Measure Help needed turning from your back to your side while in a flat bed without using bedrails?: None Help needed moving from lying on your back to sitting on the side of a flat bed without using bedrails?: A Little Help needed moving to and from a bed to a chair (including a wheelchair)?: A Little Help needed standing up from a chair using your arms (e.g., wheelchair or bedside chair)?: A Little Help needed to walk in hospital room?: A Little Help needed climbing 3-5 steps with a railing? : A Lot 6 Click Score: 18    End of Session Equipment Utilized During Treatment: Gait belt Activity Tolerance: Patient tolerated treatment well Patient left: in chair;with call bell/phone within reach;with chair alarm set Nurse Communication: Mobility status PT Visit Diagnosis: Unsteadiness on feet (R26.81);History of falling (Z91.81);Difficulty in walking, not elsewhere classified (R26.2)    Time: 1211-1225 PT Time Calculation (min) (ACUTE ONLY): 14 min   Charges:   PT Evaluation $PT Eval Moderate Complexity: 1 Mod          Ellamae Sia, [place], DPT Acute Rehabilitation Services Pager [phone] Office  [phone]  Willy Eddy 05/26/2018, 12:50 PM

## 2018-05-26 NOTE — Discharge Summary (Signed)
Physician Discharge Summary  Joyce Oliver KNL:[id number] DOB: October 10, 1924 DOA: 05/23/2018  PCP: Merrilee Seashore, MD  Admit date: 05/23/2018 Discharge date: 05/26/2018  Admitted From: Home Disposition:  Home  Recommendations for Outpatient Follow-up:  1. Follow up with PCP in 1-2 weeks 2. Please obtain BMP/CBC in one week  Home Health:Yes Equipment/Devices:None  Discharge Condition:Stable CODE STATUS:Full Diet recommendation: Heart Healthy   Brief/Interim Summary: 85 year old with past medical history of remote CAD and stent, chronic diastolic heart failure pulmonary hypertension comes into the hospital with dizziness accompanied with chest pain with found to have an intermittent third-degree block.  Discharge Diagnoses:  Principal Problem:   Dizziness Active Problems:   Third degree AV block [facility]) Cardiology was consulted and evaluated her telemetry that showed intermittent third-degree: block with 5-second pauses. EP was consulted who placed a pacemaker. He was not taking metoprolol at home.  History of pulmonary hypertension: Continue Aubagio no changes made.  Chronic kidney disease stage III: Creatinine at baseline.   Discharge Instructions  Discharge Instructions    Diet - low sodium heart healthy   Complete by:  As directed    Increase activity slowly   Complete by:  As directed      Allergies as of 05/26/2018      Reactions   Penicillins Other (See Comments)   Has patient had a PCN reaction causing immediate rash, facial/tongue/throat swelling, SOB or lightheadedness with hypotension: Unknown Has patient had a PCN reaction causing severe rash involving mucus membranes or skin necrosis: No Has patient had a PCN reaction that required hospitalization No Has patient had a PCN reaction occurring within the last 10 years: No If all of the above answers are "NO", then may proceed with Cephalosporin use.   Codeine Nausea And Vomiting   Morphine And Related  Nausea And Vomiting      Medication List    STOP taking these medications   metoprolol tartrate 50 MG tablet Commonly known as:  LOPRESSOR     TAKE these medications   albuterol 108 (90 Base) MCG/ACT inhaler Commonly known as:  PROVENTIL HFA;VENTOLIN HFA Inhale 2 puffs into the lungs every 4 (four) hours as needed for wheezing or shortness of breath.   budesonide 0.5 MG/2ML nebulizer solution Commonly known as:  PULMICORT Inhale 0.5 mg into the lungs 4 (four) times daily as needed for shortness of breath.   budesonide-formoterol 80-4.5 MCG/ACT inhaler Commonly known as:  SYMBICORT Inhale 2 puffs into the lungs 2 (two) times daily.   feeding supplement (ENSURE ENLIVE) Liqd Take 237 mLs by mouth 2 (two) times daily between meals.   furosemide 20 MG tablet Commonly known as:  LASIX Take 20 mg by mouth every other day.   Integra Plus Caps Take 1 capsule by mouth daily.   ipratropium-albuterol 0.5-2.5 (3) MG/3ML Soln Commonly known as:  DUONEB   lovastatin 40 MG tablet Commonly known as:  MEVACOR Take 0.5 tablets (20 mg total) by mouth daily after supper. What changed:  how much to take   memantine 10 MG tablet Commonly known as:  NAMENDA Take 10 mg by mouth daily.   multivitamin with minerals Tabs tablet Take 1 tablet by mouth daily.   pantoprazole 40 MG tablet Commonly known as:  PROTONIX Take 40 mg by mouth daily.   polyethylene glycol packet Commonly known as:  MIRALAX / GLYCOLAX Take 17 g by mouth daily as needed (constipation). Mix in cup of coffee and drink   sildenafil 20 MG tablet Commonly known  as:  REVATIO Take 20 mg by mouth 2 (two) times daily.   tolterodine 2 MG 24 hr capsule Commonly known as:  DETROL LA Take 1 capsule (2 mg total) by mouth daily after supper.   vitamin B-12 500 MCG tablet Commonly known as:  CYANOCOBALAMIN Take 500 mcg by mouth daily.      Follow-up Information    [facility] Follow up.    Specialty:  Cardiology Why:  06/05/2018 @ 12:30PM, wound check visit Contact information: [address], [address] [place]       Constance Haw, MD Follow up.   Specialty:  Cardiology Why:  09/05/2018 @ 2:00PM Contact information: [address] [place] [address] [phone]          Allergies  Allergen Reactions  . Penicillins Other (See Comments)    Has patient had a PCN reaction causing immediate rash, facial/tongue/throat swelling, SOB or lightheadedness with hypotension: Unknown Has patient had a PCN reaction causing severe rash involving mucus membranes or skin necrosis: No Has patient had a PCN reaction that required hospitalization No Has patient had a PCN reaction occurring within the last 10 years: No If all of the above answers are "NO", then may proceed with Cephalosporin use.    . Codeine Nausea And Vomiting  . Morphine And Related Nausea And Vomiting    Consultations:  Cardiology  EP   Procedures/Studies: Dg Chest 2 View  Result Date: 05/26/2018 CLINICAL DATA:  Status post pacemaker placement EXAM: CHEST - 2 VIEW COMPARISON:  May 23, 2018 FINDINGS: There is a pacemaker on the left. Lead tips are attached to the right atrium and right ventricle. No pneumothorax evident. There is scarring in the right apex, stable. There is no edema or consolidation. Heart is upper normal in size with pulmonary vascularity normal. No adenopathy. There is aortic atherosclerosis. There is a stent in the right axillary region. Bones are osteoporotic. IMPRESSION: Pacemaker present with lead tips attached to right atrium and right ventricle. No pneumothorax. Stable cardiac silhouette. No lung edema or consolidation. Stable right apical scarring. Aortic Atherosclerosis (ICD10-I70.0). Electronically Signed   By: Lowella Grip III M.D.   On: 05/26/2018 07:16   Dg Pelvis 1-2 Views  Result Date: 05/24/2018 CLINICAL  DATA:  Fall, history hypertension and breast cancer EXAM: PELVIS - 1-2 VIEW COMPARISON:  03/05/2008 FINDINGS: Marked osseous demineralization. RIGHT hip prosthesis. Advanced osteoarthritic changes of the LEFT hip joint with joint space narrowing, spur formation, bone-on-bone appearance and subchondral sclerosis. Question old healed fracture of the RIGHT inferior pubic ramus. Distal aspect of the hip prosthesis is not imaged. No acute fracture, dislocation or bone destruction. IMPRESSION: Osseous demineralization with prior RIGHT hip arthroplasty and advanced osteoarthritic changes of the LEFT hip joint. Question old healed fracture RIGHT inferior pubic ramus. No definite acute osseous abnormalities. Electronically Signed   By: Lavonia Dana M.D.   On: 05/24/2018 10:01   Dg Chest Port 1 View  Result Date: 05/23/2018 CLINICAL DATA:  Shortness of breath. EXAM: PORTABLE CHEST 1 VIEW COMPARISON:  Radiograph 10/10/2017, CT 06/17/2013 FINDINGS: Unchanged heart size and mediastinal contours with aortic atherosclerosis and mild cardiomegaly. No pulmonary edema. No acute airspace disease, large pleural effusion or pneumothorax. Right apical pleuroparenchymal scarring appears similar to prior exam. Vascular stent in the right subclavian. Bones are under mineralized. Remote left rib fractures. IMPRESSION: 1. No acute cardiopulmonary abnormality. Right apical scarring is unchanged from prior. 2.  Aortic Atherosclerosis (ICD10-I70.0). Electronically Signed   By: Keith Rake M.D.   On: 05/23/2018 23:43   Dg Foot 2 Views Left  Result Date: 05/24/2018 CLINICAL DATA:  Tenderness at top of LEFT foot for few days post fall EXAM: LEFT FOOT - 2 VIEW COMPARISON:  None FINDINGS: Diffuse osseous demineralization. Question subjective pes cavus. Joint spaces preserved. No acute fracture, dislocation, or bone destruction. IMPRESSION: No acute abnormalities. Electronically Signed   By: Lavonia Dana M.D.   On: 05/24/2018 09:59     (Echo, Carotid, EGD, Colonoscopy, ERCP)    Subjective: No complaints she feels great.  Discharge Exam: Vitals:   05/25/18 2222 05/26/18 0433  BP: (!) 155/75 (!) 129/95  Pulse: 79 81  Resp: 17 18  Temp: 97.7 F (36.5 C) 98.7 F (37.1 C)  SpO2: 95% 99%     General: Pt is alert, awake, not in acute distress Cardiovascular: RRR, S1/S2 +, no rubs, no gallops Respiratory: CTA bilaterally, no wheezing, no rhonchi Abdominal: Soft, NT, ND, bowel sounds + Extremities: no edema, no cyanosis    The results of significant diagnostics from this hospitalization (including imaging, microbiology, ancillary and laboratory) are listed below for reference.     Microbiology: Recent Results (from the past 240 hour(s))  Surgical PCR screen     Status: Abnormal   Collection Time: 05/25/18 11:55 AM  Result Value Ref Range Status   MRSA, PCR NEGATIVE NEGATIVE Final   Staphylococcus aureus POSITIVE (A) NEGATIVE Final    Comment: (NOTE) The Xpert SA Assay (FDA approved for NASAL specimens in patients 68 years of age and older), is one component of a comprehensive surveillance program. It is not intended to diagnose infection nor to guide or monitor treatment. Performed at [facility], [place] [address] [place], [place] [address]      Labs: BNP (last 3 results) No results for input(s): BNP in the last 8760 hours. Basic Metabolic Panel: Recent Labs  Lab 05/23/18 2322 05/24/18 0103 05/24/18 0739  NA 143  --  142  K 4.7  --  4.5  CL 108  --  114*  CO2 23  --  19*  GLUCOSE 104*  --  82  BUN 26*  --  25*  CREATININE 1.24* 1.34* 1.13*  CALCIUM 9.3  --  8.5*  MG  --  1.9  --    Liver Function Tests: Recent Labs  Lab 05/23/18 2322  AST 20  ALT 13  ALKPHOS 60  BILITOT 1.0  PROT 5.9*  ALBUMIN 3.2*   No results for input(s): LIPASE, AMYLASE in the last 168 hours. No results for input(s): AMMONIA in the last 168 hours. CBC: Recent Labs  Lab 05/23/18 2322  05/24/18 0103 05/24/18 0853  WBC 8.3 9.0 6.8  NEUTROABS 6.1  --   --   HGB 11.0* 11.0* 10.2*  HCT 35.7* 34.9* 32.1*  MCV 100.0 100.0 99.1  PLT 160 151 153   Cardiac Enzymes: Recent Labs  Lab 05/23/18 2322 05/24/18 0103 05/24/18 0739 05/24/18 1239  TROPONINI <0.03 <0.03 <0.03 <0.03   BNP: Invalid input(s): POCBNP CBG: No results for input(s): GLUCAP in the last 168 hours. D-Dimer No results for input(s): DDIMER in the last 72 hours. Hgb A1c No results for input(s): HGBA1C in the last 72 hours. Lipid Profile No results for input(s): CHOL, HDL, LDLCALC, TRIG, CHOLHDL, LDLDIRECT in the last 72 hours. Thyroid function studies Recent Labs    05/24/18 0103  TSH 2.060   Anemia  work up No results for input(s): VITAMINB12, FOLATE, FERRITIN, TIBC, IRON, RETICCTPCT in the last 72 hours. Urinalysis    Component Value Date/Time   COLORURINE YELLOW 02/28/2008 1000   APPEARANCEUR CLEAR 02/28/2008 1000   LABSPEC 1.023 02/28/2008 1000   PHURINE 5.5 02/28/2008 1000   GLUCOSEU NEGATIVE 02/28/2008 1000   HGBUR NEGATIVE 02/28/2008 1000   BILIRUBINUR SMALL (A) 02/28/2008 1000   KETONESUR TRACE (A) 02/28/2008 1000   PROTEINUR NEGATIVE 02/28/2008 1000   UROBILINOGEN 1.0 02/28/2008 1000   NITRITE NEGATIVE 02/28/2008 1000   LEUKOCYTESUR MODERATE (A) 02/28/2008 1000   Sepsis Labs Invalid input(s): PROCALCITONIN,  WBC,  LACTICIDVEN Microbiology Recent Results (from the past 240 hour(s))  Surgical PCR screen     Status: Abnormal   Collection Time: 05/25/18 11:55 AM  Result Value Ref Range Status   MRSA, PCR NEGATIVE NEGATIVE Final   Staphylococcus aureus POSITIVE (A) NEGATIVE Final    Comment: (NOTE) The Xpert SA Assay (FDA approved for NASAL specimens in patients 83 years of age and older), is one component of a comprehensive surveillance program. It is not intended to diagnose infection nor to guide or monitor treatment. Performed at [facility], [place] [address],  [place], [place] [address]      Time coordinating discharge: 40 minutes  SIGNED:   Charlynne Cousins, MD  [facility]
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
## 2018-06-04 ENCOUNTER — Telehealth: Payer: Self-pay | Admitting: *Deleted

## 2018-06-04 NOTE — Telephone Encounter (Signed)
Spoke with patient and caregiver. They are agreeable to rescheduling 06/05/18 DC wound check appointment to 06/12/18 at 12:00pm. Gave office address. Caregiver denies questions or concerns at this time.

## 2018-06-05 ENCOUNTER — Ambulatory Visit: Payer: Medicare Other

## 2018-06-12 ENCOUNTER — Ambulatory Visit: Payer: Medicare Other

## 2018-06-13 ENCOUNTER — Other Ambulatory Visit: Payer: Self-pay | Admitting: Cardiology

## 2018-06-14 ENCOUNTER — Telehealth: Payer: Self-pay | Admitting: Nurse Practitioner

## 2018-06-14 NOTE — Telephone Encounter (Signed)
Tried to call patient to move appointment no answer no vm , call rings 4 times then disconnects .

## 2018-06-15 ENCOUNTER — Telehealth: Payer: Self-pay | Admitting: Cardiology

## 2018-06-15 NOTE — Telephone Encounter (Signed)
Pt states she cancelled the appointment for 06/20/2018 and had it rescheduled for 06/22/2018 but I do not see an appointment for that day. I told the pt that the schedluler Lorenda Hatchet would give her a call back to verify when her appointment will be.

## 2018-06-15 NOTE — Telephone Encounter (Signed)
New Message:    Caregiver called and said somebody called and said pt appt for 06-20-18 was moved to 06-22-18. I did not see an appointment down for -23-20 When is she supposed to have her appointment?

## 2018-06-19 ENCOUNTER — Other Ambulatory Visit: Payer: Self-pay

## 2018-06-19 ENCOUNTER — Ambulatory Visit (INDEPENDENT_AMBULATORY_CARE_PROVIDER_SITE_OTHER): Payer: Medicare Other | Admitting: Student

## 2018-06-19 DIAGNOSIS — I441 Atrioventricular block, second degree: Secondary | ICD-10-CM

## 2018-06-19 LAB — CUP PACEART INCLINIC DEVICE CHECK
Battery Remaining Longevity: 118 mo
Battery Voltage: 3.07 V
Brady Statistic RA Percent Paced: 5.9 %
Brady Statistic RV Percent Paced: 13 %
Date Time Interrogation Session: 20200420171046
Implantable Lead Implant Date: 20200326
Implantable Lead Implant Date: 20200326
Implantable Lead Location: 753859
Implantable Lead Location: 753860
Implantable Pulse Generator Implant Date: 20200326
Lead Channel Impedance Value: 462.5 Ohm
Lead Channel Impedance Value: 550 Ohm
Lead Channel Pacing Threshold Amplitude: 0.5 V
Lead Channel Pacing Threshold Amplitude: 0.5 V
Lead Channel Pacing Threshold Amplitude: 0.5 V
Lead Channel Pacing Threshold Amplitude: 0.5 V
Lead Channel Pacing Threshold Pulse Width: 0.5 ms
Lead Channel Pacing Threshold Pulse Width: 0.5 ms
Lead Channel Pacing Threshold Pulse Width: 0.5 ms
Lead Channel Pacing Threshold Pulse Width: 0.5 ms
Lead Channel Sensing Intrinsic Amplitude: 12 mV
Lead Channel Sensing Intrinsic Amplitude: 4 mV
Lead Channel Setting Pacing Amplitude: 3.5 V
Lead Channel Setting Pacing Amplitude: 3.5 V
Lead Channel Setting Pacing Pulse Width: 0.5 ms
Lead Channel Setting Sensing Sensitivity: 2 mV
Pulse Gen Model: 2272
Pulse Gen Serial Number: 3299681

## 2018-06-19 NOTE — Progress Notes (Signed)
Pacemaker check in clinic. Normal device function. Thresholds, sensing, impedances consistent with previous measurements. Device programmed to maximize longevity. No high ventricular rates noted. 5 AMS entries reviewed, all with EGM available (<1%). Longest 14 seconds with rate up to 173 in RA. Suspect atrial tachycardia. V rate remained stable. Device programmed at appropriate safety margins. Histogram distribution appropriate for patient activity level. Device programmed to optimize intrinsic conduction. Estimated longevity 9 yr, 10 mo. Patient enrolled in remote follow-up with Dr Irven Shelling office. Plan to follow every 3 months remotely and see annually in office. Patient education completed. ROV with Dr Curt Bears in 3 months   Beryle Beams" [place], [place] 06/19/2018 5:15 PM

## 2018-06-20 ENCOUNTER — Ambulatory Visit: Payer: Medicare Other

## 2018-08-24 DIAGNOSIS — Z950 Presence of cardiac pacemaker: Secondary | ICD-10-CM

## 2018-08-24 DIAGNOSIS — Z45018 Encounter for adjustment and management of other part of cardiac pacemaker: Secondary | ICD-10-CM | POA: Diagnosis not present

## 2018-08-24 DIAGNOSIS — I495 Sick sinus syndrome: Secondary | ICD-10-CM

## 2018-08-24 DIAGNOSIS — I442 Atrioventricular block, complete: Secondary | ICD-10-CM | POA: Diagnosis not present

## 2018-09-02 ENCOUNTER — Encounter: Payer: Self-pay | Admitting: Cardiology

## 2018-09-02 DIAGNOSIS — Z45018 Encounter for adjustment and management of other part of cardiac pacemaker: Secondary | ICD-10-CM

## 2018-09-02 DIAGNOSIS — Z950 Presence of cardiac pacemaker: Secondary | ICD-10-CM

## 2018-09-02 HISTORY — DX: Presence of cardiac pacemaker: Z95.0

## 2018-09-02 HISTORY — DX: Encounter for adjustment and management of other part of cardiac pacemaker: Z45.018

## 2018-09-04 ENCOUNTER — Telehealth: Payer: Self-pay | Admitting: Cardiology

## 2018-09-04 NOTE — Telephone Encounter (Signed)
New Message         COVID-19 Pre-Screening Questions:   In the past 7 to 10 days have you had a cough,  shortness of breath, headache, congestion, fever (100 or greater) body aches, chills, sore throat, or sudden loss of taste or sense of smell? NO  Have you been around anyone with known Covid 19. NO  Have you been around anyone who is awaiting Covid 19 test results in the past 7 to 10 days? NO  Have you been around anyone who has been exposed to Covid 19, or has mentioned symptoms of Covid 19 within the past 7 to 10 days? NO Pt will need assistance, Nevin Bloodgood Will be bringing the pt   If you have any concerns/questions about symptoms patients report during screening (either on the phone or at threshold). Contact the provider seeing the patient or DOD for further guidance.  If neither are available contact a member of the leadership team.

## 2018-09-05 ENCOUNTER — Encounter: Payer: Self-pay | Admitting: Cardiology

## 2018-09-05 ENCOUNTER — Ambulatory Visit (INDEPENDENT_AMBULATORY_CARE_PROVIDER_SITE_OTHER): Payer: Medicare Other | Admitting: Cardiology

## 2018-09-05 ENCOUNTER — Other Ambulatory Visit: Payer: Self-pay

## 2018-09-05 VITALS — BP 110/56 | HR 71 | Ht 63.0 in | Wt 110.0 lb

## 2018-09-05 DIAGNOSIS — I442 Atrioventricular block, complete: Secondary | ICD-10-CM

## 2018-09-05 LAB — CUP PACEART INCLINIC DEVICE CHECK
Battery Remaining Longevity: 129 mo
Battery Voltage: 3.02 V
Brady Statistic RA Percent Paced: 40 %
Brady Statistic RV Percent Paced: 21 %
Date Time Interrogation Session: 20200707143732
Implantable Lead Implant Date: 20200326
Implantable Lead Implant Date: 20200326
Implantable Lead Location: 753859
Implantable Lead Location: 753860
Implantable Pulse Generator Implant Date: 20200326
Lead Channel Impedance Value: 387.5 Ohm
Lead Channel Impedance Value: 550 Ohm
Lead Channel Pacing Threshold Amplitude: 0.625 V
Lead Channel Pacing Threshold Amplitude: 0.75 V
Lead Channel Pacing Threshold Amplitude: 0.75 V
Lead Channel Pacing Threshold Amplitude: 0.75 V
Lead Channel Pacing Threshold Pulse Width: 0.5 ms
Lead Channel Pacing Threshold Pulse Width: 0.5 ms
Lead Channel Pacing Threshold Pulse Width: 0.5 ms
Lead Channel Pacing Threshold Pulse Width: 0.5 ms
Lead Channel Sensing Intrinsic Amplitude: 12 mV
Lead Channel Sensing Intrinsic Amplitude: 2.5 mV
Lead Channel Setting Pacing Amplitude: 0.875
Lead Channel Setting Pacing Amplitude: 1.75 V
Lead Channel Setting Pacing Pulse Width: 0.5 ms
Lead Channel Setting Sensing Sensitivity: 2 mV
Pulse Gen Model: 2272
Pulse Gen Serial Number: 3299681

## 2018-09-05 NOTE — Progress Notes (Signed)
Electrophysiology Office Note Date: 09/05/2018  ID:  Joyce Oliver, DOB 13-Jul-1924, MRN [id number]  PCP: Merrilee Seashore, MD Primary Cardiologist: No primary care provider on file. Electrophysiologist: None  CC: Pacemaker follow-up  Joyce Oliver is a 85 y.o. female with a hx of CAD (remote PCI 1999,negative nuclear stress test in 2017), HTN, chronic SOB with p.HTN , HTNadmitted with new onset and recurrent dizzy spells, weak spells, and what is described as a mechanical fall, found to have Mobitz II and intermittent CHB with pauses 4-5 seconds. She underwent implant of St Jude PPM  She presents today for routine EP follow up. She reports doing very well.  She denies chest pain, palpitations, dyspnea, PND, orthopnea, nausea, vomiting, dizziness, syncope, edema, weight gain, or early satiety. She is mostly sedentary per her caregiver. She has no complaints today.   Device History: St. Jude dual chamber PPM implanted 05/25/2018 for advanced heart block including CHB  Past Medical History:  Diagnosis Date  . Cancer of breast, female [facility]) 1973   rt mastectomy  . Hypertension    Past Surgical History:  Procedure Laterality Date  . CARDIAC CATHETERIZATION N/A 07/08/2015   Procedure: Right Heart Cath;  Surgeon: Adrian Prows, MD;  Location: [place] [facility];  Service: Cardiovascular;  Laterality: N/A;  . CORONARY ANGIOPLASTY    . EYE SURGERY     Cataract surgery, bilateral lens implant  . hip revison-right  Right   . HIP SURGERY Right   . KNEE ARTHROSCOPY Right   . MASTECTOMY Right   . PACEMAKER IMPLANT N/A 05/25/2018   Procedure: PACEMAKER IMPLANT;  Surgeon: Constance Haw, MD;  Location: [place] [facility];  Service: Cardiovascular;  Laterality: N/A;  . PERIPHERAL VASCULAR CATHETERIZATION N/A 07/08/2015   Procedure: Upper Extremity Angiography/Subclavion;  Surgeon: Adrian Prows, MD;  Location: [place] [facility];  Service: Cardiovascular;  Laterality: N/A;  . PERIPHERAL VASCULAR  CATHETERIZATION  07/08/2015   Procedure: Peripheral Vascular Intervention;  Surgeon: Adrian Prows, MD;  Location: [place] [facility];  Service: Cardiovascular;;  rt axillary INNOVA 5X80   Current Outpatient Medications  Medication Sig Dispense Refill  . albuterol (PROVENTIL HFA;VENTOLIN HFA) 108 (90 Base) MCG/ACT inhaler Inhale 2 puffs into the lungs every 4 (four) hours as needed for wheezing or shortness of breath.    . budesonide (PULMICORT) 0.5 MG/2ML nebulizer solution Inhale 0.5 mg into the lungs 4 (four) times daily as needed for shortness of breath.    . budesonide-formoterol (SYMBICORT) 80-4.5 MCG/ACT inhaler Inhale 2 puffs into the lungs 2 (two) times daily.    . feeding supplement, ENSURE ENLIVE, (ENSURE ENLIVE) LIQD Take 237 mLs by mouth 2 (two) times daily between meals. 237 mL 12  . FeFum-FePoly-FA-B Cmp-C-Biot (INTEGRA PLUS) CAPS Take 1 capsule by mouth daily.    . furosemide (LASIX) 20 MG tablet Take 20 mg by mouth every other day.     . ipratropium-albuterol (DUONEB) 0.5-2.5 (3) MG/3ML SOLN     . lovastatin (MEVACOR) 40 MG tablet Take 0.5 tablets (20 mg total) by mouth daily after supper. 30 tablet 0  . memantine (NAMENDA) 10 MG tablet Take 10 mg by mouth daily.    . Multiple Vitamin (MULTIVITAMIN WITH MINERALS) TABS tablet Take 1 tablet by mouth daily.    . pantoprazole (PROTONIX) 40 MG tablet Take 40 mg by mouth daily.    . polyethylene glycol (MIRALAX / GLYCOLAX) packet Take 17 g by mouth daily as needed (constipation). Mix in cup  of coffee and drink    . sildenafil (REVATIO) 20 MG tablet TAKE 1 TABLET BY MOUTH TWICE DAILY 180 tablet 1  . tolterodine (DETROL LA) 2 MG 24 hr capsule Take 1 capsule (2 mg total) by mouth daily after supper. 30 capsule 0  . vitamin B-12 (CYANOCOBALAMIN) 500 MCG tablet Take 500 mcg by mouth daily.     No current facility-administered medications for this visit.    Allergies:   Penicillins, Codeine, and Morphine and related   Social History: Social  History   Socioeconomic History  . Marital status: Single    Spouse name: Not on file  . Number of children: Not on file  . Years of education: Not on file  . Highest education level: Not on file  Occupational History  . Not on file  Social Needs  . Financial resource strain: Not on file  . Food insecurity    Worry: Not on file    Inability: Not on file  . Transportation needs    Medical: Not on file    Non-medical: Not on file  Tobacco Use  . Smoking status: Never Smoker  . Smokeless tobacco: Never Used  Substance and Sexual Activity  . Alcohol use: No  . Drug use: No  . Sexual activity: Not on file  Lifestyle  . Physical activity    Days per week: Not on file    Minutes per session: Not on file  . Stress: Not on file  Relationships  . Social Herbalist on phone: Not on file    Gets together: Not on file    Attends religious service: Not on file    Active member of club or organization: Not on file    Attends meetings of clubs or organizations: Not on file    Relationship status: Not on file  . Intimate partner violence    Fear of current or ex partner: Not on file    Emotionally abused: Not on file    Physically abused: Not on file    Forced sexual activity: Not on file  Other Topics Concern  . Not on file  Social History Narrative  . Not on file   Family History: Family History  Family history unknown: Yes   Review of Systems: All other systems reviewed and are otherwise negative except as noted above.  Physical Exam: Vitals:   09/05/18 1401  BP: (!) 110/56  Pulse: 71  Weight: 110 lb (49.9 kg)  Height: 5\' 3"  (1.6 m)     GEN- The patient is well appearing, alert and oriented x 3 today.   HEENT: normocephalic, atraumatic; sclera clear, conjunctiva pink; hearing intact; oropharynx clear; neck supple  Lungs- Clear to ausculation bilaterally, normal work of breathing.  No wheezes, rales, rhonchi Heart- Regular rate and rhythm, no murmurs,  rubs or gallops  GI- soft, non-tender, non-distended, bowel sounds present  Extremities- no clubbing, cyanosis, or edema  MS- no significant deformity or atrophy Skin- warm and dry, no rash or lesion; PPM pocket well healed Psych- euthymic mood, full affect Neuro- strength and sensation are intact  PPM Interrogation- reviewed in detail today,  See PACEART report  EKG:  EKG is ordered today. The ekg ordered today shows V-paced at 48 with frequent PVCs  Recent Labs: 05/23/2018: ALT 13 05/24/2018: BUN 25; Creatinine, Ser 1.13; Hemoglobin 10.2; Magnesium 1.9; Platelets 153; Potassium 4.5; Sodium 142; TSH 2.060   Wt Readings from Last 3 Encounters:  09/05/18 110 lb (  49.9 kg)  05/26/18 109 lb 1.6 oz (49.5 kg)  10/12/17 96 lb 1.9 oz (43.6 kg)     Assessment and Plan:  1.  Advanced heart block including CHB s/p St Jude PPM -  - Stable device function today - See Pace Art report - She has requested we follow her remotely. Will make sure set up correctly.    Current medicines are reviewed at length with the patient today.   The patient does not have concerns regarding her medicines.  The following changes were made today: None  Labs/ tests ordered today include:  Orders Placed This Encounter  Procedures  . EKG 12-Lead   Disposition:  Follow up remotely every 3 months, follow up with Dr. Curt Bears annually.   Signed, Will Meredith Leeds, MD  09/05/2018 2:35 PM  [place] [address] [phone] (office) [phone] (fax)  I have seen and examined this patient with Oda Kilts.  Agree with above, note added to reflect my findings.  On exam, RRR, no murmurs, lungs clear.  Patient status post [facility] for intermittent complete heart block.  Device functioning appropriately.  Changes made for long-term battery management.  In discussion with the patient, she would prefer that I follow her pacemaker.  We will  make sure that we are following remote transmissions.  We will see her back in 1 year.  Will M. Camnitz MD 09/05/2018 2:35 PM

## 2018-09-06 ENCOUNTER — Telehealth: Payer: Self-pay

## 2018-09-06 NOTE — Telephone Encounter (Signed)
Per pt and her niece they want to be released to [facility], I will release her today from [facility]

## 2018-10-29 IMAGING — US US RENAL
1 series · 14 of 25 positions shown · non-contrast
Comparison: None.

CLINICAL DATA: Acute renal failure.

EXAM:
RENAL / URINARY TRACT ULTRASOUND COMPLETE

[Series 1: us renal · 0.19mm/px · 14 of 32 slices shown]
[im 1/32]
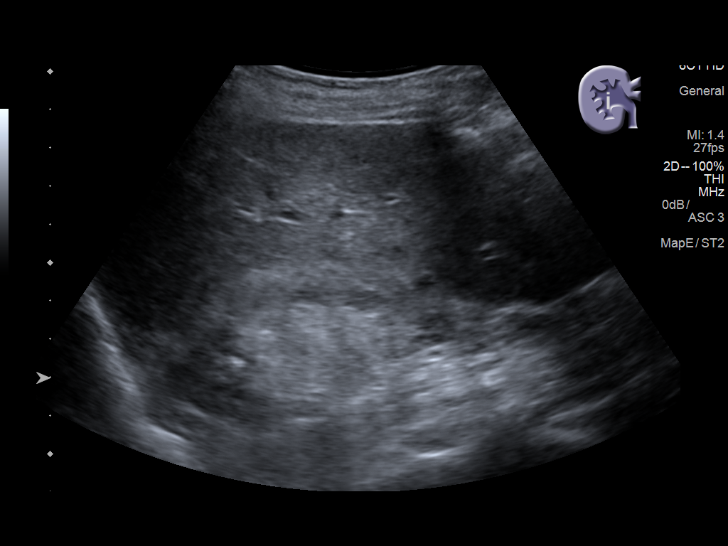
[im 3/32]
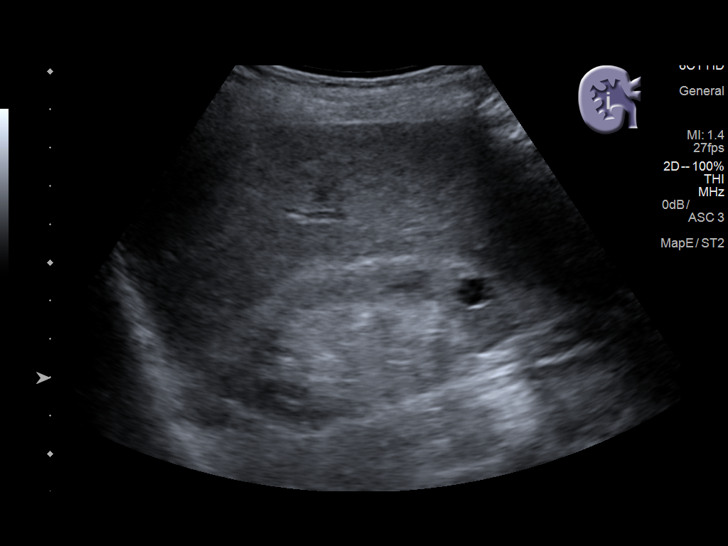
[im 6/32]
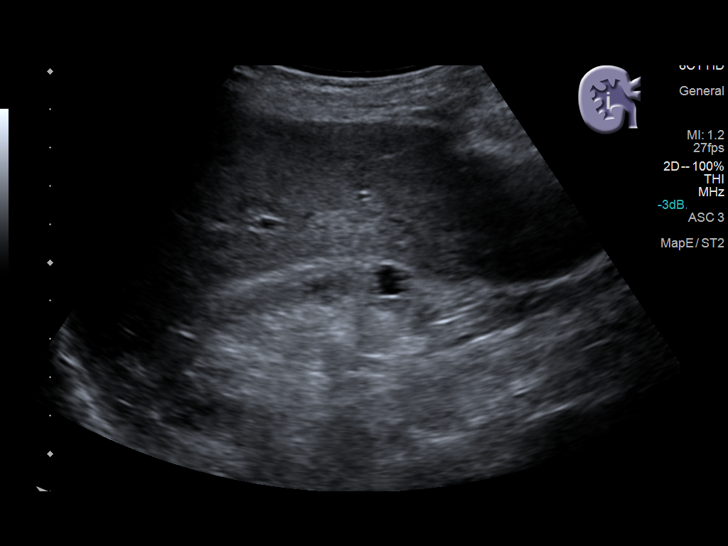
[im 8/32]
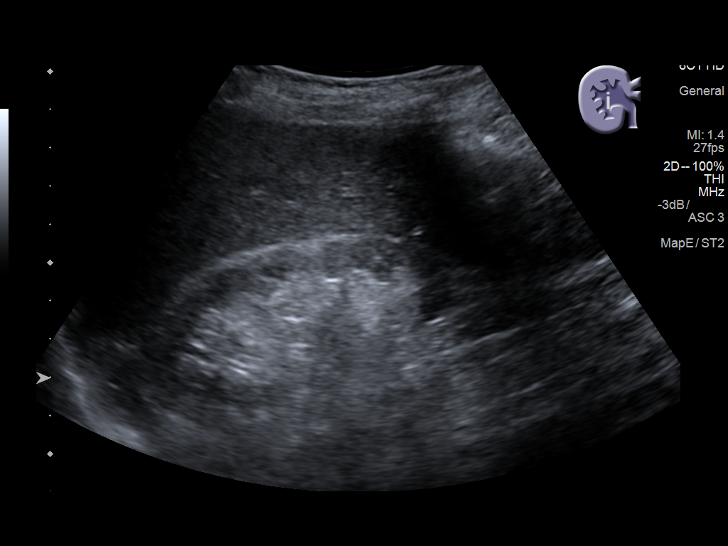
[im 11/32]
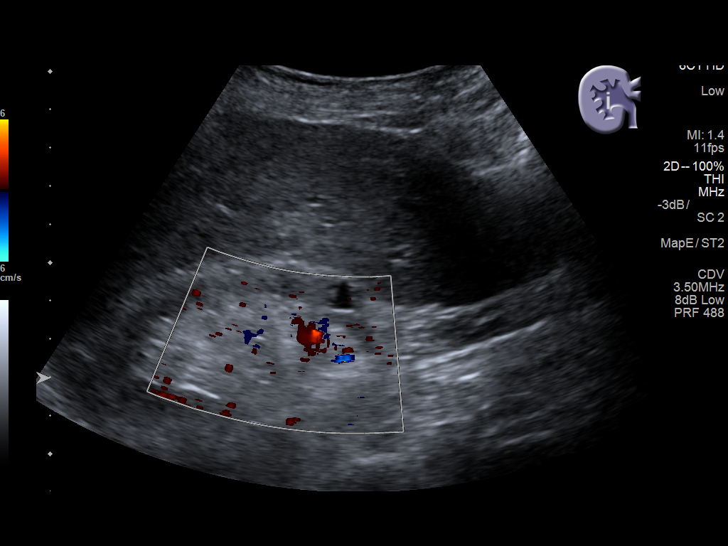
[im 12/32]
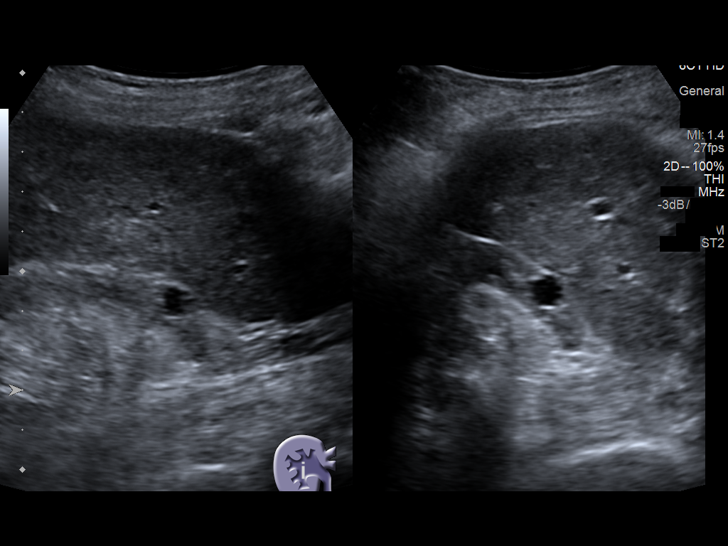
[im 15/32]
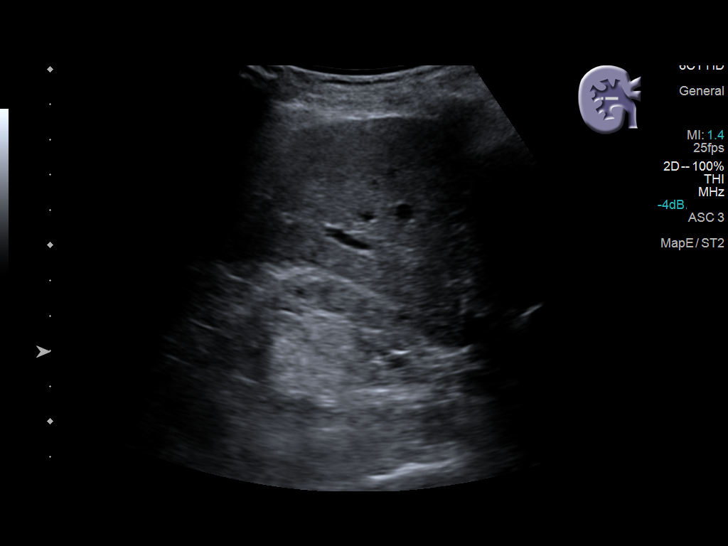
[im 17/32]
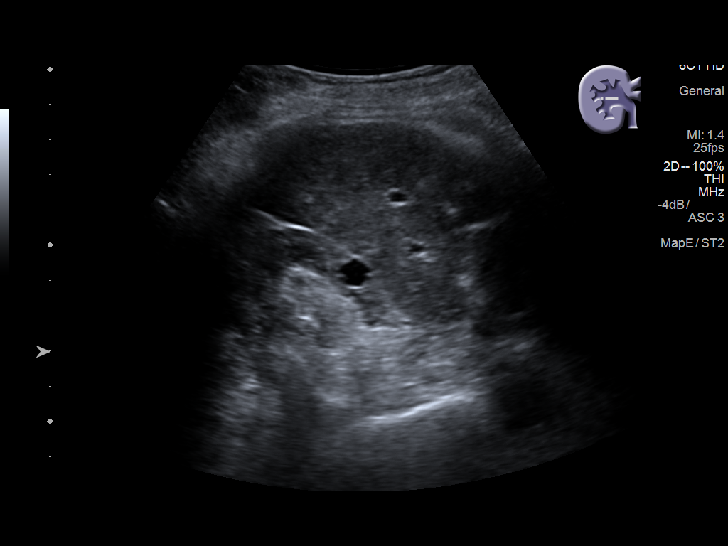
[im 20/32]
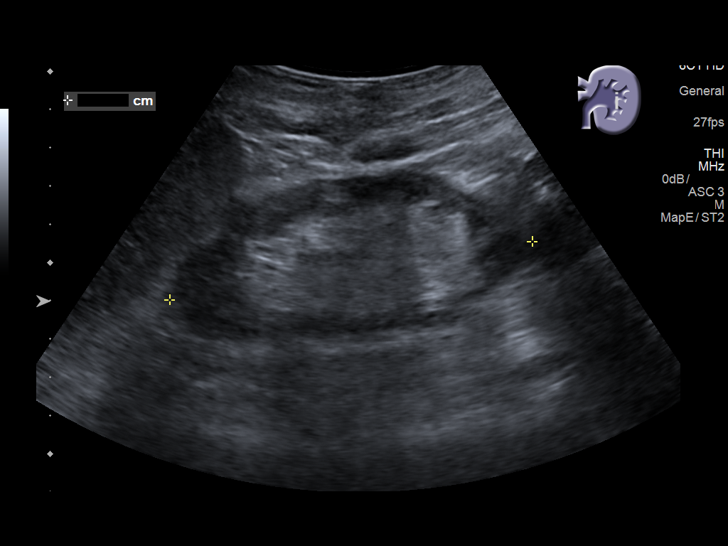
[im 21/32]
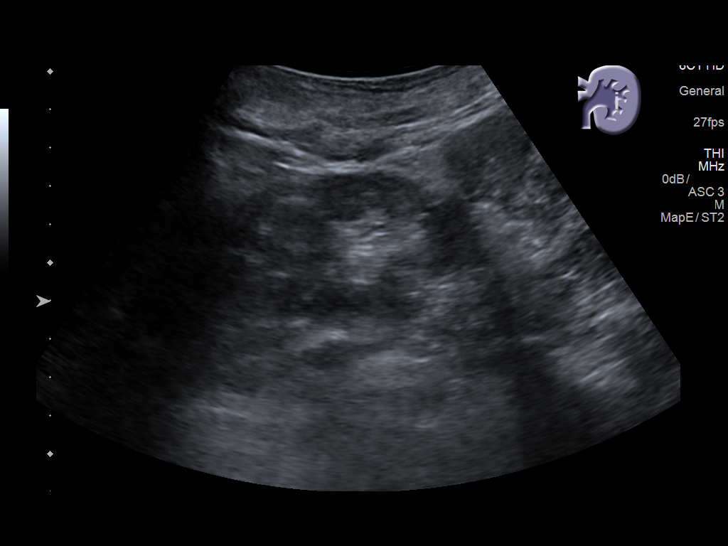
[im 24/32]
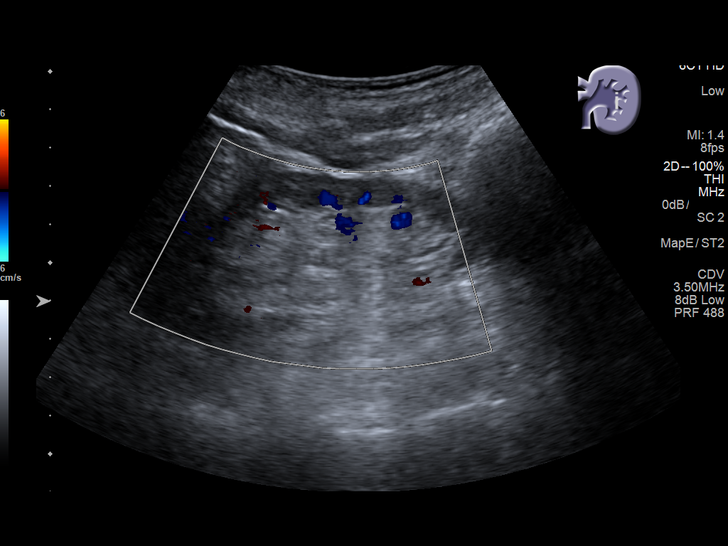
[im 26/32]
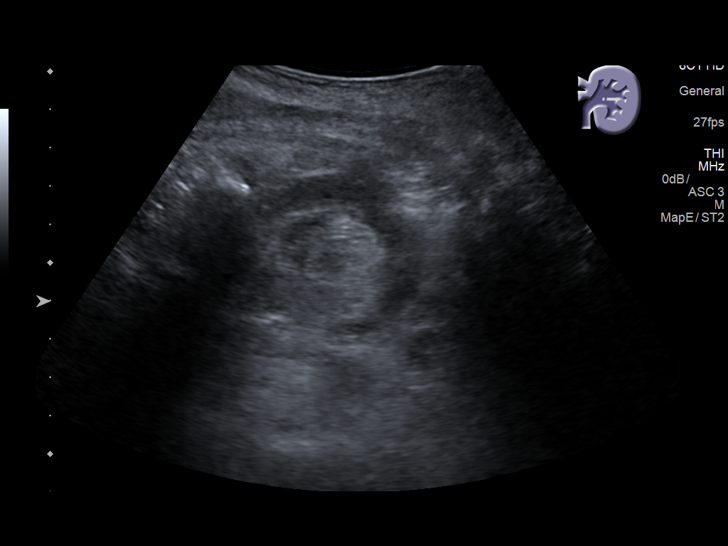
[im 29/32]
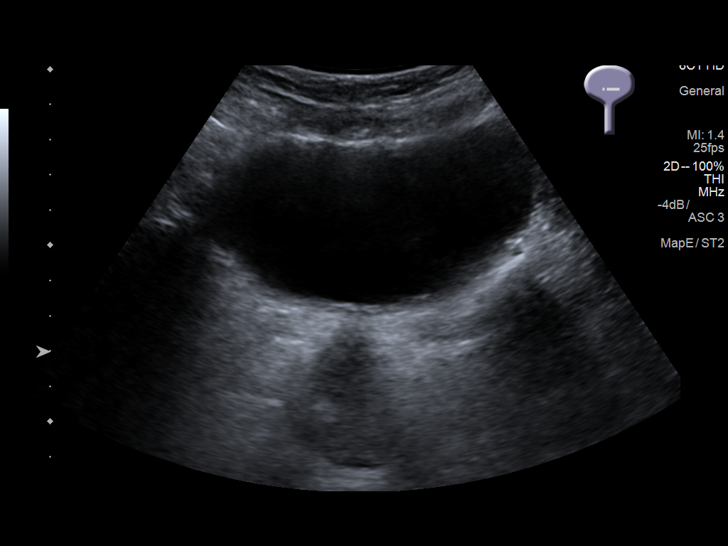
[im 32/32]
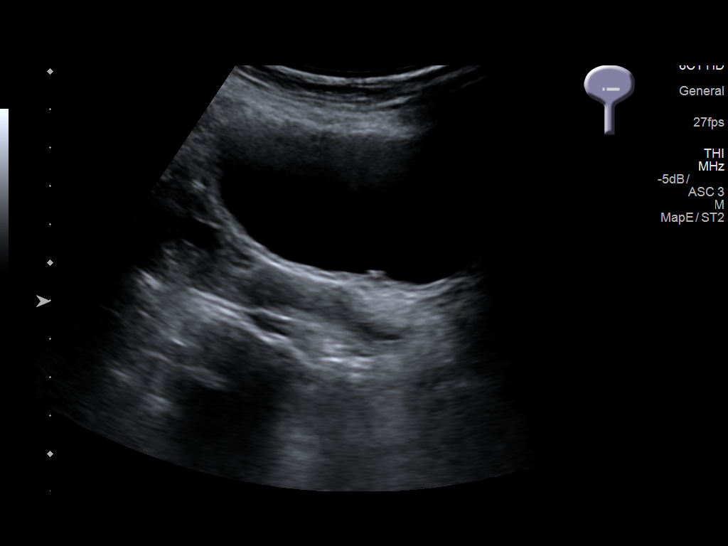

[14 of 25 positions shown; findings below may reference images not displayed]

FINDINGS: Right Kidney:

Length: 8.6 cm. The cortex is thinned and cortical echogenicity is
increased. Simple cyst measuring 0.8 cm incidentally noted. No
hydronephrosis.

Left Kidney:

Length: 9.6 cm.. The cortex is thinned and cortical echogenicity is
increased. No hydronephrosis.

Bladder:

Appears normal for degree of bladder distention.
IMPRESSION: No acute abnormality. Negative for hydronephrosis. Senescent change
both kidneys.

## 2018-11-08 ENCOUNTER — Other Ambulatory Visit: Payer: Self-pay | Admitting: Cardiology

## 2018-11-15 ENCOUNTER — Telehealth: Payer: Self-pay

## 2018-11-15 NOTE — Telephone Encounter (Signed)
Pt is being followed by [employer]

## 2018-11-21 ENCOUNTER — Telehealth: Payer: Self-pay | Admitting: Cardiology

## 2018-11-21 NOTE — Telephone Encounter (Signed)
Spoke to pt's caregiver.  PCP prescribes this medication.  Informed that PCP will need to approve refills.   Caregiver reports that pt has been out of the medicine for a week now. Informed that I would send back to our refill team and ask them to send message to pharmacy to obtain refill request through PCP STAT since pt is w/o medication. Caregiver verbalized understanding and agreeable to plan.

## 2018-11-21 NOTE — Telephone Encounter (Signed)
New message    *STAT* If patient is at the pharmacy, call can be transferred to refill team.   1. Which medications need to be refilled? (please list name of each medication and dose if known) furosemide (LASIX) 20 MG tablet  2. Which pharmacy/location (including street and city if local pharmacy) is medication to be sent to?[facility] [place].  3. Do they need a 30 day or 90 day supply? [place]

## 2018-11-21 NOTE — Telephone Encounter (Signed)
Pt calling requesting a refill on Furosemide 20 mg tablet. Would Dr. Curt Bears like to refill this medication? Please address

## 2018-11-22 ENCOUNTER — Other Ambulatory Visit: Payer: Self-pay

## 2018-11-22 DIAGNOSIS — I442 Atrioventricular block, complete: Secondary | ICD-10-CM

## 2018-11-22 MED ORDER — FUROSEMIDE 20 MG PO TABS: 20.0000 mg | ORAL_TABLET | ORAL | 3 refills | Status: DC

## 2018-11-22 NOTE — Telephone Encounter (Signed)
Pharmacy contacted, informing them that pt needs to refill with PCP.

## 2018-11-28 ENCOUNTER — Telehealth: Payer: Self-pay | Admitting: Cardiology

## 2018-11-28 NOTE — Telephone Encounter (Signed)
° °  Caregiver calling to report SOB.  Please call patient   Pt c/o Shortness Of Breath: STAT if SOB developed within the last 24 hours or pt is noticeably SOB on the phone  1. Are you currently SOB (can you hear that pt is SOB on the phone)? no  2. How long have you been experiencing SOB? 1 week   3. Are you SOB when sitting or when up moving around? Moving around  4. Are you currently experiencing any other symptoms? weakness

## 2018-11-28 NOTE — Telephone Encounter (Signed)
Caregiver reports pt recently developed SOB, weakness and taking longer to eat than usual. Advised to call PCP to discuss these concerns further.  Informed reported issues should not be related to her PPM. Advised to have PCP refer to [place] if they feel cardiology related issues. Caregiver agreeable to plan.

## 2018-12-20 DIAGNOSIS — H6120 Impacted cerumen, unspecified ear: Secondary | ICD-10-CM | POA: Diagnosis not present

## 2018-12-20 DIAGNOSIS — N390 Urinary tract infection, site not specified: Secondary | ICD-10-CM | POA: Diagnosis not present

## 2018-12-20 DIAGNOSIS — Z7189 Other specified counseling: Secondary | ICD-10-CM | POA: Diagnosis not present

## 2019-01-13 ENCOUNTER — Other Ambulatory Visit: Payer: Self-pay | Admitting: Cardiology

## 2019-01-28 ENCOUNTER — Encounter: Payer: Self-pay | Admitting: Cardiology

## 2019-02-20 ENCOUNTER — Telehealth: Payer: Self-pay

## 2019-02-20 NOTE — Telephone Encounter (Signed)
-----   Message from Adrian Prows, MD sent at 01/28/2019  8:06 AM EST ----- Regarding: Pacemaker needs transmission Is she following [place] or [facility]  JG

## 2019-02-20 NOTE — Telephone Encounter (Signed)
[facility] and I cannot reach the patient to ask her if she wants [place] to follow her or not

## 2019-04-18 DIAGNOSIS — Z471 Aftercare following joint replacement surgery: Secondary | ICD-10-CM | POA: Diagnosis not present

## 2019-04-18 DIAGNOSIS — Z96641 Presence of right artificial hip joint: Secondary | ICD-10-CM | POA: Diagnosis not present

## 2019-04-20 ENCOUNTER — Ambulatory Visit: Payer: Medicare Other

## 2019-04-22 ENCOUNTER — Ambulatory Visit: Payer: Medicare HMO | Attending: Internal Medicine

## 2019-04-22 DIAGNOSIS — Z23 Encounter for immunization: Secondary | ICD-10-CM | POA: Insufficient documentation

## 2019-04-22 NOTE — Progress Notes (Signed)
   Covid-19 Vaccination Clinic  Name:  LORESA HAAPALA    MRN: [id number] DOB: [birth year]  04/22/2019  Ms. Jenson was observed post Covid-19 immunization for 15 minutes without incidence. She was provided with Vaccine Information Sheet and instruction to access the V-Safe system.   Ms. Vallejos was instructed to call 911 with any severe reactions post vaccine: Marland Kitchen Difficulty breathing  . Swelling of your face and throat  . A fast heartbeat  . A bad rash all over your body  . Dizziness and weakness    Immunizations Administered    Name Date Dose VIS Date Route   Pfizer COVID-19 Vaccine 04/22/2019 10:17 AM 0.3 mL 02/09/2019 Intramuscular   Manufacturer: [place]   Lot: [id number]   [place]: [id number]

## 2019-05-03 IMAGING — CR DG CHEST 2V
3 series · 3 of 3 positions shown · non-contrast
Comparison: December 16, 2014

CLINICAL DATA: Multiple falls, low heart rate.

EXAM:
CHEST - 2 VIEW

[x chest ap (1 of 2)]
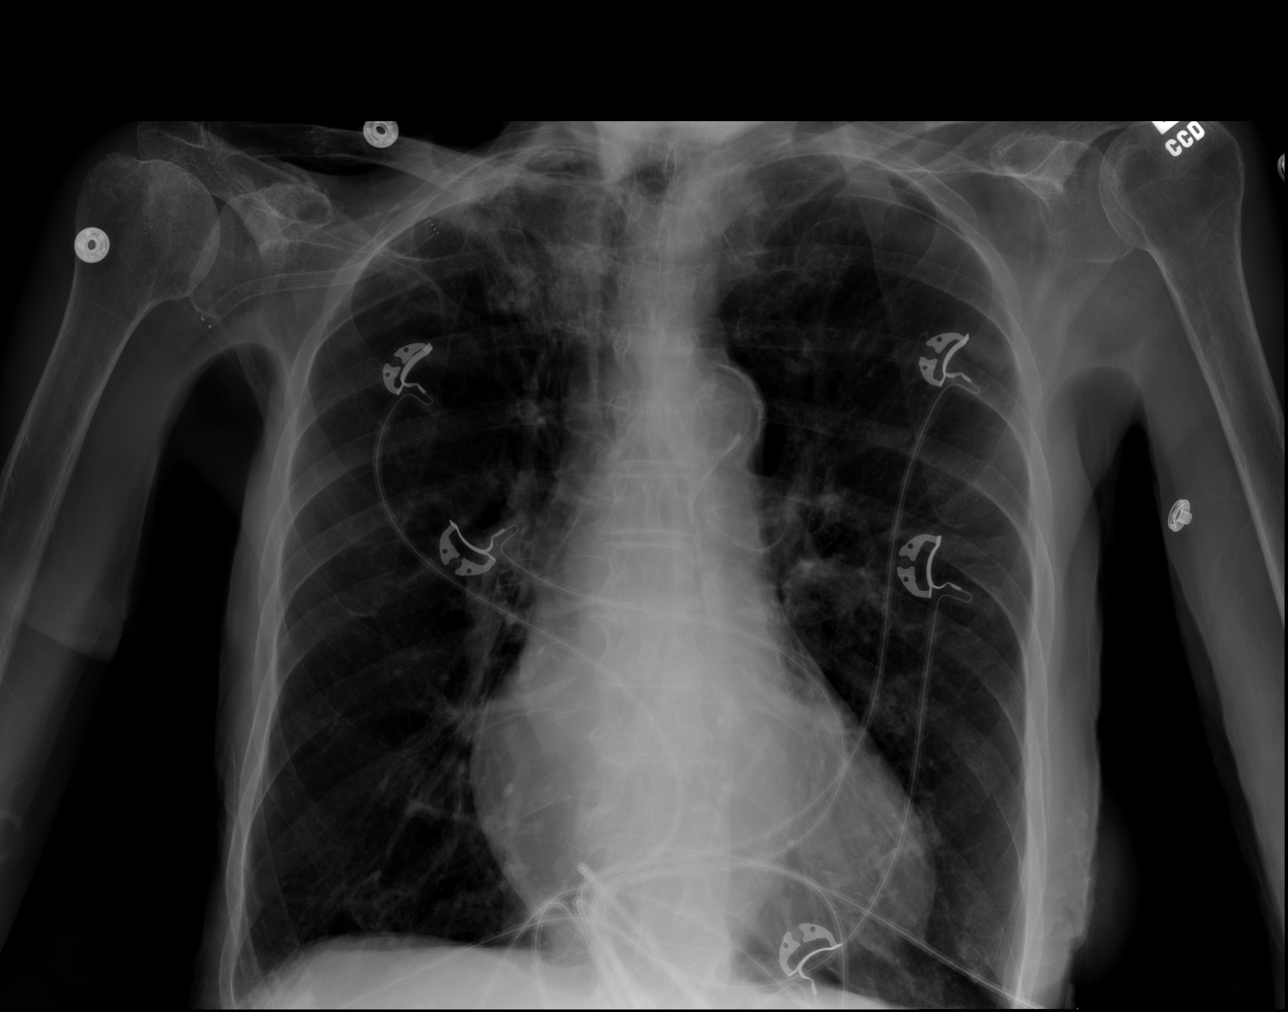

[x chest ap (2 of 2)]
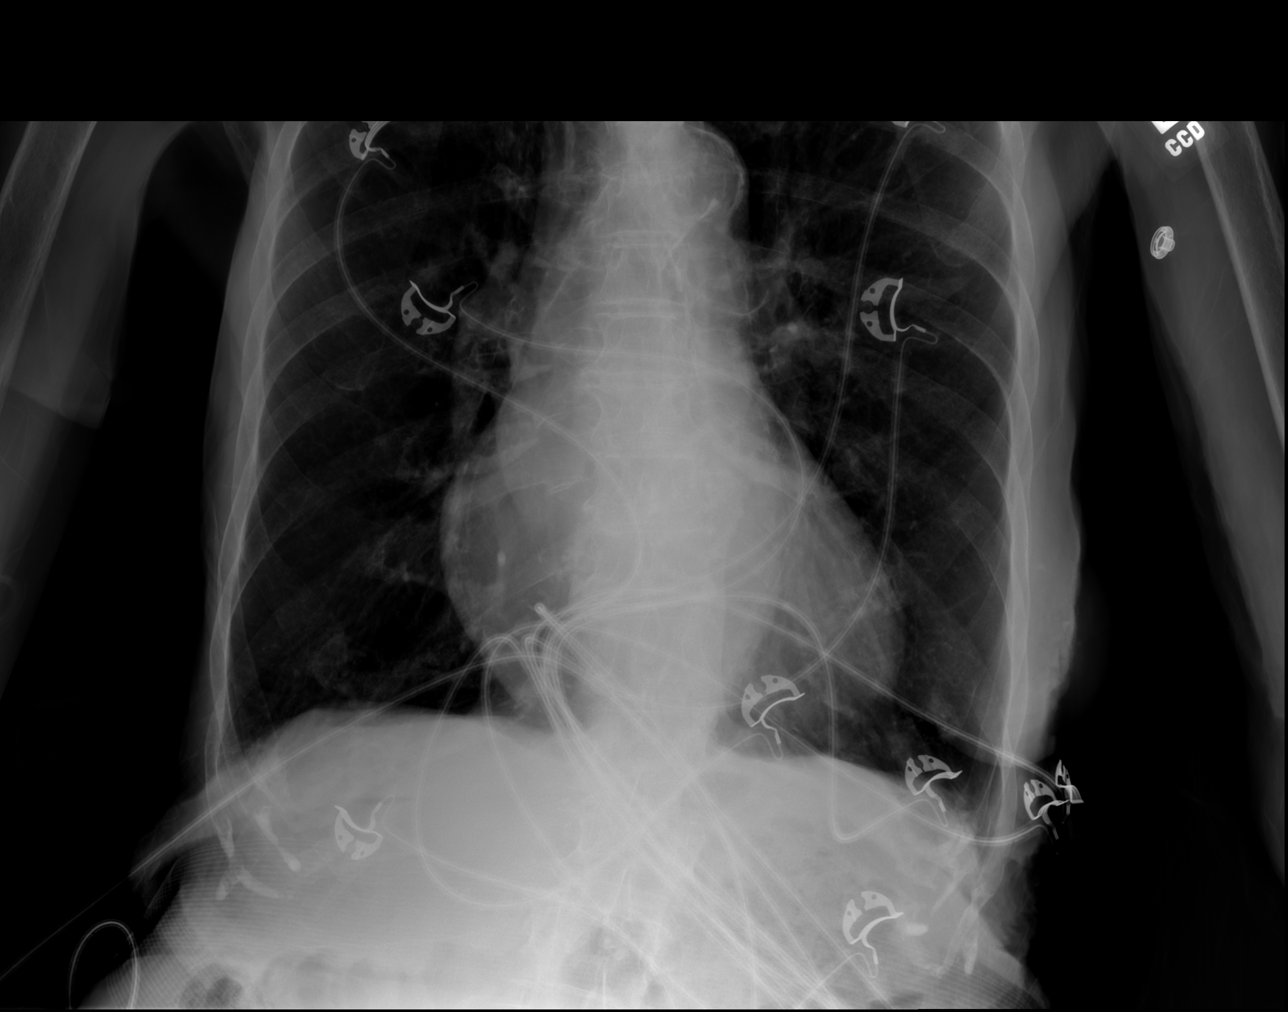

[w chest lat]
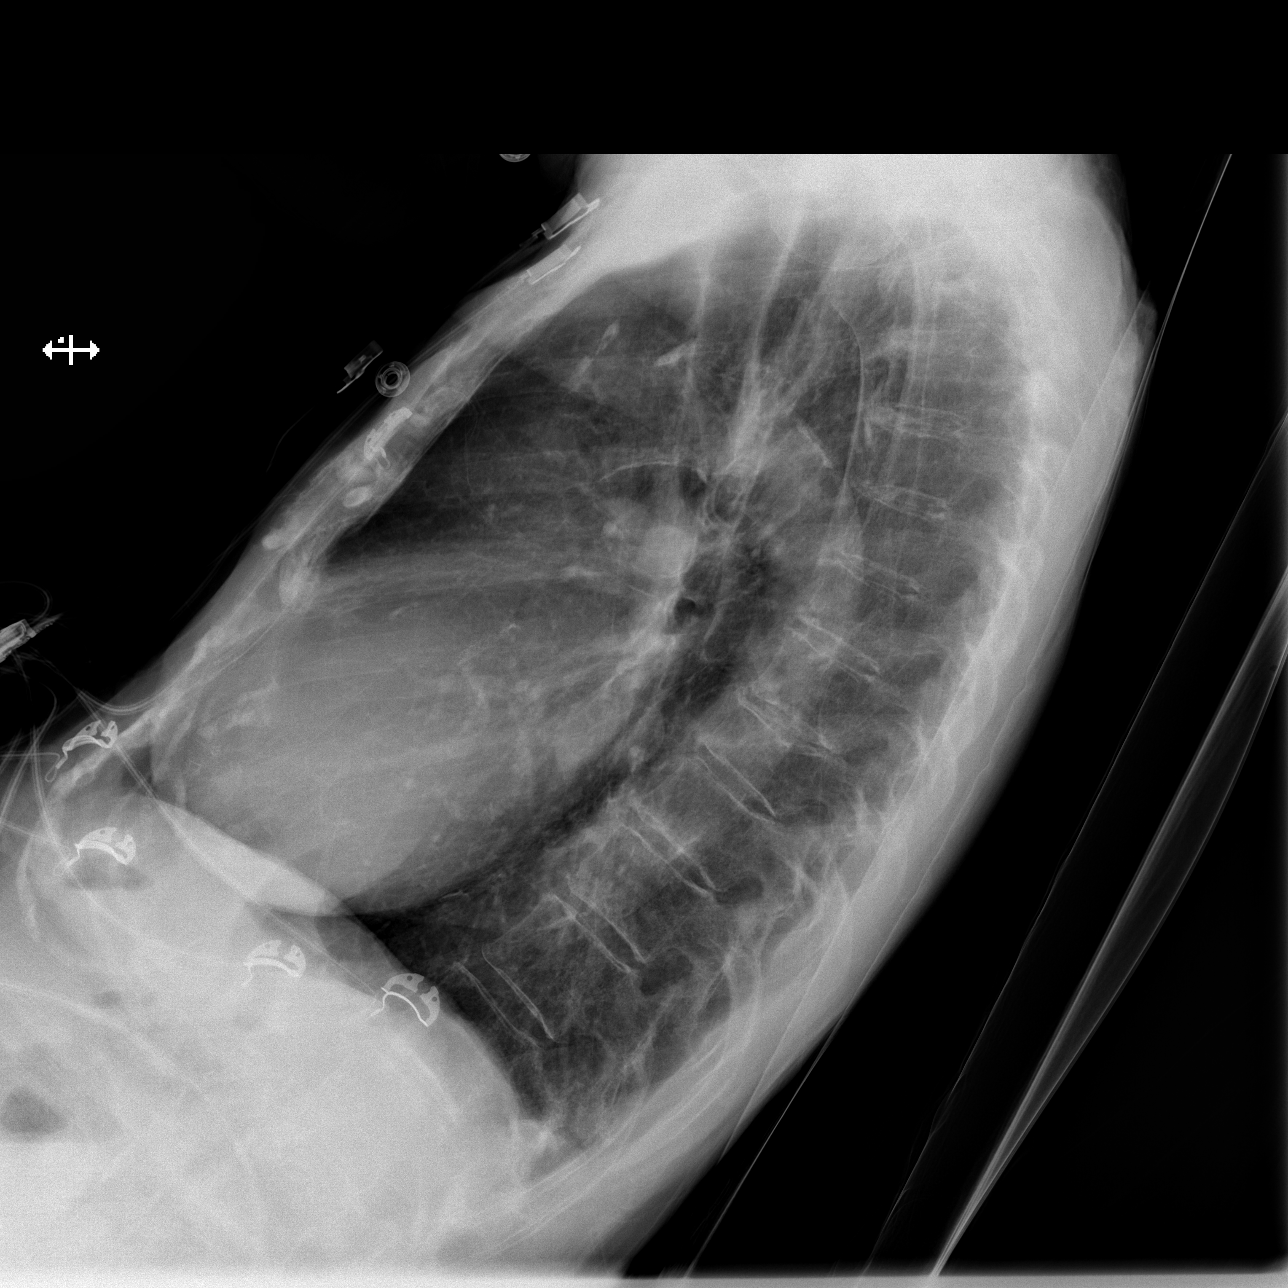

[3 of 3 positions shown; findings below may reference images not displayed]

FINDINGS: The heart size and mediastinal contours are within normal limits.
The lungs are hyperinflated. There is questioned mass with
associated scarring in the right lung apex. There is no focal
pneumonia, pneumothorax, pulmonary edema or pleural effusion. The
visualized skeletal structures are stable. No definite rib fracture
is not identified.
IMPRESSION: No active cardiopulmonary disease.

Question mass associated with scar in the right lung apex, further
evaluation with chest CT is recommended.

Hyperinflated lungs.

## 2019-05-09 DIAGNOSIS — N183 Chronic kidney disease, stage 3 unspecified: Secondary | ICD-10-CM | POA: Diagnosis not present

## 2019-05-09 DIAGNOSIS — E782 Mixed hyperlipidemia: Secondary | ICD-10-CM | POA: Diagnosis not present

## 2019-05-09 DIAGNOSIS — I270 Primary pulmonary hypertension: Secondary | ICD-10-CM | POA: Diagnosis not present

## 2019-05-09 DIAGNOSIS — I10 Essential (primary) hypertension: Secondary | ICD-10-CM | POA: Diagnosis not present

## 2019-05-09 DIAGNOSIS — I2510 Atherosclerotic heart disease of native coronary artery without angina pectoris: Secondary | ICD-10-CM | POA: Diagnosis not present

## 2019-05-16 ENCOUNTER — Ambulatory Visit: Payer: Medicare HMO | Attending: Internal Medicine

## 2019-05-16 DIAGNOSIS — Z23 Encounter for immunization: Secondary | ICD-10-CM

## 2019-05-16 NOTE — Progress Notes (Signed)
   Covid-19 Vaccination Clinic  Name:  Joyce Oliver    MRN: [id number] DOB: [birth year]  05/16/2019  [name] was observed post Covid-19 immunization for 15 minutes without incident. She was provided with Vaccine Information Sheet and instruction to access the V-Safe system.   [name] was instructed to call 911 with any severe reactions post vaccine: Marland Kitchen Difficulty breathing  . Swelling of face and throat  . A fast heartbeat  . A bad rash all over body  . Dizziness and weakness   Immunizations Administered    Name Date Dose VIS Date Route   Pfizer COVID-19 Vaccine 05/16/2019  9:58 AM 0.3 mL 02/09/2019 Intramuscular   Manufacturer: [place]   Lot: [id number]   [place]: [id number]

## 2019-09-27 ENCOUNTER — Other Ambulatory Visit: Payer: Self-pay

## 2019-09-27 ENCOUNTER — Ambulatory Visit: Payer: Medicare HMO | Admitting: Cardiology

## 2019-09-27 ENCOUNTER — Encounter: Payer: Self-pay | Admitting: Cardiology

## 2019-09-27 VITALS — BP 104/60 | HR 89 | Ht 63.0 in | Wt 102.6 lb

## 2019-09-27 DIAGNOSIS — I442 Atrioventricular block, complete: Secondary | ICD-10-CM

## 2019-09-27 LAB — CUP PACEART INCLINIC DEVICE CHECK
Battery Remaining Longevity: 127 mo
Battery Voltage: 3.02 V
Brady Statistic RA Percent Paced: 15 %
Brady Statistic RV Percent Paced: 95 %
Date Time Interrogation Session: 20210729144800
Implantable Lead Implant Date: 20200326
Implantable Lead Implant Date: 20200326
Implantable Lead Location: 753859
Implantable Lead Location: 753860
Implantable Pulse Generator Implant Date: 20200326
Lead Channel Impedance Value: 387.5 Ohm
Lead Channel Impedance Value: 450 Ohm
Lead Channel Pacing Threshold Amplitude: 0.625 V
Lead Channel Pacing Threshold Amplitude: 0.625 V
Lead Channel Pacing Threshold Pulse Width: 0.5 ms
Lead Channel Pacing Threshold Pulse Width: 0.5 ms
Lead Channel Sensing Intrinsic Amplitude: 10.9 mV
Lead Channel Sensing Intrinsic Amplitude: 3.2 mV
Lead Channel Setting Pacing Amplitude: 0.875
Lead Channel Setting Pacing Amplitude: 1.625
Lead Channel Setting Pacing Pulse Width: 0.5 ms
Lead Channel Setting Sensing Sensitivity: 2 mV
Pulse Gen Model: 2272
Pulse Gen Serial Number: 3299681

## 2019-09-27 NOTE — Progress Notes (Signed)
Electrophysiology Office Note   Date:  09/27/2019   ID:  Joyce Oliver, DOB December 21, 1924, MRN [id number]  PCP:  Merrilee Seashore, MD  Cardiologist:  Einar Gip Primary Electrophysiologist:  Constance Haw, MD    Chief Complaint: CHB, pacemaker   History of Present Illness: Joyce Oliver is a 86 y.o. female who is being seen today for the evaluation of pacemaker at the request of Merrilee Seashore, MD. Presenting today for electrophysiology evaluation.  She has a history of coronary artery disease status post PCI in 1999, hypertension, pulmonary hypertension, and complete heart block.  She presented to the hospital March 2020 and is now status post [place] dual-chamber pacemaker implanted 05/25/2018.  Today, she denies symptoms of palpitations, chest pain, shortness of breath, orthopnea, PND, lower extremity edema, claudication, dizziness, presyncope, syncope, bleeding, or neurologic sequela. The patient is tolerating medications without difficulties.    Past Medical History:  Diagnosis Date  . Cancer of breast, female [facility]) 1973   rt mastectomy  . Complete heart block ([place]) 10/10/2017  . Encounter for care of pacemaker 09/02/2018  . Hypertension   . Pacemaker: [place] 2272 Assurity MRI cardiac pacemaker in situ 05/25/2018 09/02/2018   Remote pacemaker check  6.25.20: 6 AT/AF, max 1.90mins. No A. Fib. longevity is 5.1 years. RA pacing is 42 %, RV pacing is 12 %.   Past Surgical History:  Procedure Laterality Date  . CARDIAC CATHETERIZATION N/A 07/08/2015   Procedure: Right Heart Cath;  Surgeon: Adrian Prows, MD;  Location: [place] [facility];  Service: Cardiovascular;  Laterality: N/A;  . CORONARY ANGIOPLASTY    . EYE SURGERY     Cataract surgery, bilateral lens implant  . hip revison-right  Right   . HIP SURGERY Right   . KNEE ARTHROSCOPY Right   . MASTECTOMY Right   . PACEMAKER IMPLANT N/A 05/25/2018   Procedure: PACEMAKER IMPLANT;  Surgeon: Constance Haw, MD;   Location: [place] [facility];  Service: Cardiovascular;  Laterality: N/A;  . PERIPHERAL VASCULAR CATHETERIZATION N/A 07/08/2015   Procedure: Upper Extremity Angiography/Subclavion;  Surgeon: Adrian Prows, MD;  Location: [place] [facility];  Service: Cardiovascular;  Laterality: N/A;  . PERIPHERAL VASCULAR CATHETERIZATION  07/08/2015   Procedure: Peripheral Vascular Intervention;  Surgeon: Adrian Prows, MD;  Location: [place] [facility];  Service: Cardiovascular;;  rt axillary INNOVA 5X80     Current Outpatient Medications  Medication Sig Dispense Refill  . budesonide (PULMICORT) 0.5 MG/2ML nebulizer solution Inhale 0.5 mg into the lungs 4 (four) times daily as needed for shortness of breath.    . budesonide-formoterol (SYMBICORT) 80-4.5 MCG/ACT inhaler Inhale 2 puffs into the lungs 2 (two) times daily.    . feeding supplement, ENSURE ENLIVE, (ENSURE ENLIVE) LIQD Take 237 mLs by mouth 2 (two) times daily between meals. 237 mL 12  . FeFum-FePoly-FA-B Cmp-C-Biot (INTEGRA PLUS) CAPS Take 1 capsule by mouth daily.    Marland Kitchen ipratropium-albuterol (DUONEB) 0.5-2.5 (3) MG/3ML SOLN     . lovastatin (MEVACOR) 40 MG tablet Take 0.5 tablets (20 mg total) by mouth daily after supper. 30 tablet 0  . memantine (NAMENDA) 10 MG tablet Take 10 mg by mouth daily.    . Multiple Vitamin (MULTIVITAMIN WITH MINERALS) TABS tablet Take 1 tablet by mouth daily.    . pantoprazole (PROTONIX) 40 MG tablet Take 40 mg by mouth daily.    . polyethylene glycol (MIRALAX / GLYCOLAX) packet Take 17 g by mouth daily as needed (constipation). Mix  in cup of coffee and drink    . sildenafil (REVATIO) 20 MG tablet TAKE 1 TABLET BY MOUTH TWICE DAILY 180 tablet 1  . tolterodine (DETROL LA) 2 MG 24 hr capsule Take 1 capsule (2 mg total) by mouth daily after supper. 30 capsule 0  . vitamin B-12 (CYANOCOBALAMIN) 500 MCG tablet Take 500 mcg by mouth daily.     No current facility-administered medications for this visit.    Allergies:   Penicillins,  Codeine, and Morphine and related   Social History:  The patient  reports that she has never smoked. She has never used smokeless tobacco. She reports that she does not drink alcohol and does not use drugs.   Family History:  The patient's Family history is unknown by patient.    ROS:  Please see the history of present illness.   Otherwise, review of systems is positive for none.   All other systems are reviewed and negative.    PHYSICAL EXAM: VS:  BP (!) 104/60   Pulse 89   Ht 5\' 3"  (1.6 m)   Wt 102 lb 9.6 oz (46.5 kg)   SpO2 95%   BMI 18.17 kg/m  , BMI Body mass index is 18.17 kg/m. GEN: Well nourished, well developed, in no acute distress  HEENT: normal  Neck: no JVD, carotid bruits, or masses Cardiac: RRR; no murmurs, rubs, or gallops,no edema  Respiratory:  clear to auscultation bilaterally, normal work of breathing GI: soft, nontender, nondistended, + BS MS: no deformity or atrophy  Skin: warm and dry, device pocket is well healed Neuro:  Strength and sensation are intact Psych: euthymic mood, full affect  EKG:  EKG is ordered today. Personal review of the ekg ordered shows sinus rhythm, ventricular paced  Device interrogation is reviewed today in detail.  See PaceArt for details.   Recent Labs: No results found for requested labs within last 8760 hours.    Lipid Panel  No results found for: CHOL, TRIG, HDL, CHOLHDL, VLDL, LDLCALC, LDLDIRECT   Wt Readings from Last 3 Encounters:  09/27/19 102 lb 9.6 oz (46.5 kg)  09/05/18 110 lb (49.9 kg)  05/26/18 109 lb 1.6 oz (49.5 kg)      Other studies Reviewed: Additional studies/ records that were reviewed today include: TTE 10/11/17  Review of the above records today demonstrates:  - Left ventricle: The cavity size was normal. Systolic function was  vigorous. The estimated ejection fraction was in the range of 65%  to 70%. Wall motion was normal; there were no regional wall  motion abnormalities. There was  an increased relative  contribution of atrial contraction to ventricular filling.  Doppler parameters are consistent with abnormal left ventricular  relaxation (grade 1 diastolic dysfunction). Doppler parameters  are consistent with high ventricular filling pressure.  - Aortic valve: Severely calcified annulus. Trileaflet; moderately  thickened, moderately calcified leaflets. Valve area (VTI): 0.85  cm^2. Valve area (Vmax): 0.71 cm^2. Valve area (Vmean): 0.68  cm^2.  - Mitral valve: Severely calcified annulus. The findings are  consistent with mild stenosis. There was mild regurgitation.  Valve area by pressure half-time: 1.67 cm^2.  - Pulmonary arteries: PA peak pressure: 49 mm Hg (S).  - Pericardium, extracardiac: A small, free-flowing pericardial  effusion was identified along the right ventricular free wall.  The fluid had no internal echoes.    ASSESSMENT AND PLAN:  1.  Complete heart block: Status post [facility] Jude dual-chamber pacemaker.  Device functioning appropriately.  No changes at  this time.  2.  Coronary artery disease: No current chest pain.  Plan per primary cardiology.  3.  Hypertension: Currently well controlled    Current medicines are reviewed at length with the patient today.   The patient does not have concerns regarding her medicines.  The following changes were made today:  none  Labs/ tests ordered today include:  Orders Placed This Encounter  Procedures  . EKG 12-Lead     Disposition:   FU with [facility] 1 year  Signed, Laneah Luft Meredith Leeds, MD  09/27/2019 3:07 PM     [place] [address] [place] [address] [phone] (office) [phone] (fax)

## 2019-09-27 NOTE — Patient Instructions (Signed)
Medication Instructions:  Your physician recommends that you continue on your current medications as directed. Please refer to the Current Medication list given to you today.  *If you need a refill on your cardiac medications before your next appointment, please call your pharmacy*   Lab Work: None ordered If you have labs (blood work) drawn today and your tests are completely normal, you will receive your results only by: Marland Kitchen MyChart Message (if you have MyChart) OR . A paper copy in the mail If you have any lab test that is abnormal or we need to change your treatment, we will call you to review the results.   Testing/Procedures: None ordered   Follow-Up: At [facility], you and your health needs are our priority.  As part of our continuing mission to provide you with exceptional heart care, we have created designated Provider Care Teams.  These Care Teams include your primary Cardiologist (physician) and Advanced Practice Providers (APPs -  Physician Assistants and Nurse Practitioners) who all work together to provide you with the care you need, when you need it.  Your next appointment:   1 year(s)  The format for your next appointment:   In Person  Provider:   You may see Will Meredith Leeds, MD or one of the following Advanced Practice Providers on your designated Care Team:    Chanetta Marshall, NP  Tommye Standard, PA-C  Legrand Como "[place]" [place], [place]

## 2019-10-05 DIAGNOSIS — Z0000 Encounter for general adult medical examination without abnormal findings: Secondary | ICD-10-CM | POA: Diagnosis not present

## 2019-10-05 DIAGNOSIS — I270 Primary pulmonary hypertension: Secondary | ICD-10-CM | POA: Diagnosis not present

## 2019-10-05 DIAGNOSIS — I700 Atherosclerosis of aorta: Secondary | ICD-10-CM | POA: Diagnosis not present

## 2019-10-05 DIAGNOSIS — E782 Mixed hyperlipidemia: Secondary | ICD-10-CM | POA: Diagnosis not present

## 2019-10-05 DIAGNOSIS — R69 Illness, unspecified: Secondary | ICD-10-CM | POA: Diagnosis not present

## 2019-10-05 DIAGNOSIS — I442 Atrioventricular block, complete: Secondary | ICD-10-CM | POA: Diagnosis not present

## 2019-10-05 DIAGNOSIS — N1832 Chronic kidney disease, stage 3b: Secondary | ICD-10-CM | POA: Diagnosis not present

## 2019-10-05 DIAGNOSIS — I2510 Atherosclerotic heart disease of native coronary artery without angina pectoris: Secondary | ICD-10-CM | POA: Diagnosis not present

## 2019-10-05 DIAGNOSIS — N183 Chronic kidney disease, stage 3 unspecified: Secondary | ICD-10-CM | POA: Diagnosis not present

## 2019-10-05 DIAGNOSIS — I10 Essential (primary) hypertension: Secondary | ICD-10-CM | POA: Diagnosis not present

## 2019-10-24 DIAGNOSIS — I270 Primary pulmonary hypertension: Secondary | ICD-10-CM | POA: Diagnosis not present

## 2019-10-24 DIAGNOSIS — M810 Age-related osteoporosis without current pathological fracture: Secondary | ICD-10-CM | POA: Diagnosis not present

## 2019-10-24 DIAGNOSIS — G2581 Restless legs syndrome: Secondary | ICD-10-CM | POA: Diagnosis not present

## 2019-10-24 DIAGNOSIS — R69 Illness, unspecified: Secondary | ICD-10-CM | POA: Diagnosis not present

## 2019-10-24 DIAGNOSIS — M6289 Other specified disorders of muscle: Secondary | ICD-10-CM | POA: Diagnosis not present

## 2019-10-24 DIAGNOSIS — I129 Hypertensive chronic kidney disease with stage 1 through stage 4 chronic kidney disease, or unspecified chronic kidney disease: Secondary | ICD-10-CM | POA: Diagnosis not present

## 2019-10-24 DIAGNOSIS — I700 Atherosclerosis of aorta: Secondary | ICD-10-CM | POA: Diagnosis not present

## 2019-10-24 DIAGNOSIS — I2510 Atherosclerotic heart disease of native coronary artery without angina pectoris: Secondary | ICD-10-CM | POA: Diagnosis not present

## 2019-10-24 DIAGNOSIS — M150 Primary generalized (osteo)arthritis: Secondary | ICD-10-CM | POA: Diagnosis not present

## 2019-10-24 DIAGNOSIS — I442 Atrioventricular block, complete: Secondary | ICD-10-CM | POA: Diagnosis not present

## 2019-10-24 DIAGNOSIS — N1832 Chronic kidney disease, stage 3b: Secondary | ICD-10-CM | POA: Diagnosis not present

## 2019-11-07 DIAGNOSIS — I270 Primary pulmonary hypertension: Secondary | ICD-10-CM | POA: Diagnosis not present

## 2019-11-07 DIAGNOSIS — M810 Age-related osteoporosis without current pathological fracture: Secondary | ICD-10-CM | POA: Diagnosis not present

## 2019-11-07 DIAGNOSIS — N1832 Chronic kidney disease, stage 3b: Secondary | ICD-10-CM | POA: Diagnosis not present

## 2019-11-07 DIAGNOSIS — I129 Hypertensive chronic kidney disease with stage 1 through stage 4 chronic kidney disease, or unspecified chronic kidney disease: Secondary | ICD-10-CM | POA: Diagnosis not present

## 2019-11-23 DIAGNOSIS — G2581 Restless legs syndrome: Secondary | ICD-10-CM | POA: Diagnosis not present

## 2019-11-23 DIAGNOSIS — I700 Atherosclerosis of aorta: Secondary | ICD-10-CM | POA: Diagnosis not present

## 2019-11-23 DIAGNOSIS — I2510 Atherosclerotic heart disease of native coronary artery without angina pectoris: Secondary | ICD-10-CM | POA: Diagnosis not present

## 2019-11-23 DIAGNOSIS — R69 Illness, unspecified: Secondary | ICD-10-CM | POA: Diagnosis not present

## 2019-11-23 DIAGNOSIS — I442 Atrioventricular block, complete: Secondary | ICD-10-CM | POA: Diagnosis not present

## 2019-11-23 DIAGNOSIS — M810 Age-related osteoporosis without current pathological fracture: Secondary | ICD-10-CM | POA: Diagnosis not present

## 2019-11-23 DIAGNOSIS — M150 Primary generalized (osteo)arthritis: Secondary | ICD-10-CM | POA: Diagnosis not present

## 2019-11-23 DIAGNOSIS — I129 Hypertensive chronic kidney disease with stage 1 through stage 4 chronic kidney disease, or unspecified chronic kidney disease: Secondary | ICD-10-CM | POA: Diagnosis not present

## 2019-11-23 DIAGNOSIS — I270 Primary pulmonary hypertension: Secondary | ICD-10-CM | POA: Diagnosis not present

## 2019-11-23 DIAGNOSIS — N1832 Chronic kidney disease, stage 3b: Secondary | ICD-10-CM | POA: Diagnosis not present

## 2019-11-27 DIAGNOSIS — R69 Illness, unspecified: Secondary | ICD-10-CM | POA: Diagnosis not present

## 2019-11-27 DIAGNOSIS — I2510 Atherosclerotic heart disease of native coronary artery without angina pectoris: Secondary | ICD-10-CM | POA: Diagnosis not present

## 2019-11-27 DIAGNOSIS — M150 Primary generalized (osteo)arthritis: Secondary | ICD-10-CM | POA: Diagnosis not present

## 2019-11-27 DIAGNOSIS — M810 Age-related osteoporosis without current pathological fracture: Secondary | ICD-10-CM | POA: Diagnosis not present

## 2019-11-27 DIAGNOSIS — I442 Atrioventricular block, complete: Secondary | ICD-10-CM | POA: Diagnosis not present

## 2019-11-27 DIAGNOSIS — I700 Atherosclerosis of aorta: Secondary | ICD-10-CM | POA: Diagnosis not present

## 2019-11-27 DIAGNOSIS — I270 Primary pulmonary hypertension: Secondary | ICD-10-CM | POA: Diagnosis not present

## 2019-11-27 DIAGNOSIS — G2581 Restless legs syndrome: Secondary | ICD-10-CM | POA: Diagnosis not present

## 2019-11-27 DIAGNOSIS — I129 Hypertensive chronic kidney disease with stage 1 through stage 4 chronic kidney disease, or unspecified chronic kidney disease: Secondary | ICD-10-CM | POA: Diagnosis not present

## 2019-11-27 DIAGNOSIS — N1832 Chronic kidney disease, stage 3b: Secondary | ICD-10-CM | POA: Diagnosis not present

## 2019-11-28 DIAGNOSIS — I2510 Atherosclerotic heart disease of native coronary artery without angina pectoris: Secondary | ICD-10-CM | POA: Diagnosis not present

## 2019-11-28 DIAGNOSIS — I129 Hypertensive chronic kidney disease with stage 1 through stage 4 chronic kidney disease, or unspecified chronic kidney disease: Secondary | ICD-10-CM | POA: Diagnosis not present

## 2019-11-28 DIAGNOSIS — R69 Illness, unspecified: Secondary | ICD-10-CM | POA: Diagnosis not present

## 2019-11-28 DIAGNOSIS — I270 Primary pulmonary hypertension: Secondary | ICD-10-CM | POA: Diagnosis not present

## 2019-11-28 DIAGNOSIS — N1832 Chronic kidney disease, stage 3b: Secondary | ICD-10-CM | POA: Diagnosis not present

## 2019-11-28 DIAGNOSIS — M810 Age-related osteoporosis without current pathological fracture: Secondary | ICD-10-CM | POA: Diagnosis not present

## 2019-11-28 DIAGNOSIS — G2581 Restless legs syndrome: Secondary | ICD-10-CM | POA: Diagnosis not present

## 2019-11-28 DIAGNOSIS — I442 Atrioventricular block, complete: Secondary | ICD-10-CM | POA: Diagnosis not present

## 2019-11-28 DIAGNOSIS — M150 Primary generalized (osteo)arthritis: Secondary | ICD-10-CM | POA: Diagnosis not present

## 2019-11-28 DIAGNOSIS — I700 Atherosclerosis of aorta: Secondary | ICD-10-CM | POA: Diagnosis not present

## 2019-11-29 DIAGNOSIS — Z23 Encounter for immunization: Secondary | ICD-10-CM | POA: Diagnosis not present

## 2019-12-05 DIAGNOSIS — I129 Hypertensive chronic kidney disease with stage 1 through stage 4 chronic kidney disease, or unspecified chronic kidney disease: Secondary | ICD-10-CM | POA: Diagnosis not present

## 2019-12-05 DIAGNOSIS — I2510 Atherosclerotic heart disease of native coronary artery without angina pectoris: Secondary | ICD-10-CM | POA: Diagnosis not present

## 2019-12-05 DIAGNOSIS — G2581 Restless legs syndrome: Secondary | ICD-10-CM | POA: Diagnosis not present

## 2019-12-05 DIAGNOSIS — M810 Age-related osteoporosis without current pathological fracture: Secondary | ICD-10-CM | POA: Diagnosis not present

## 2019-12-05 DIAGNOSIS — I700 Atherosclerosis of aorta: Secondary | ICD-10-CM | POA: Diagnosis not present

## 2019-12-05 DIAGNOSIS — R69 Illness, unspecified: Secondary | ICD-10-CM | POA: Diagnosis not present

## 2019-12-05 DIAGNOSIS — N1832 Chronic kidney disease, stage 3b: Secondary | ICD-10-CM | POA: Diagnosis not present

## 2019-12-05 DIAGNOSIS — M150 Primary generalized (osteo)arthritis: Secondary | ICD-10-CM | POA: Diagnosis not present

## 2019-12-05 DIAGNOSIS — I442 Atrioventricular block, complete: Secondary | ICD-10-CM | POA: Diagnosis not present

## 2019-12-05 DIAGNOSIS — I270 Primary pulmonary hypertension: Secondary | ICD-10-CM | POA: Diagnosis not present

## 2019-12-06 DIAGNOSIS — I700 Atherosclerosis of aorta: Secondary | ICD-10-CM | POA: Diagnosis not present

## 2019-12-06 DIAGNOSIS — I270 Primary pulmonary hypertension: Secondary | ICD-10-CM | POA: Diagnosis not present

## 2019-12-06 DIAGNOSIS — G2581 Restless legs syndrome: Secondary | ICD-10-CM | POA: Diagnosis not present

## 2019-12-06 DIAGNOSIS — I442 Atrioventricular block, complete: Secondary | ICD-10-CM | POA: Diagnosis not present

## 2019-12-06 DIAGNOSIS — M150 Primary generalized (osteo)arthritis: Secondary | ICD-10-CM | POA: Diagnosis not present

## 2019-12-06 DIAGNOSIS — M810 Age-related osteoporosis without current pathological fracture: Secondary | ICD-10-CM | POA: Diagnosis not present

## 2019-12-06 DIAGNOSIS — I129 Hypertensive chronic kidney disease with stage 1 through stage 4 chronic kidney disease, or unspecified chronic kidney disease: Secondary | ICD-10-CM | POA: Diagnosis not present

## 2019-12-06 DIAGNOSIS — R69 Illness, unspecified: Secondary | ICD-10-CM | POA: Diagnosis not present

## 2019-12-06 DIAGNOSIS — N1832 Chronic kidney disease, stage 3b: Secondary | ICD-10-CM | POA: Diagnosis not present

## 2019-12-06 DIAGNOSIS — I2510 Atherosclerotic heart disease of native coronary artery without angina pectoris: Secondary | ICD-10-CM | POA: Diagnosis not present

## 2019-12-12 DIAGNOSIS — I700 Atherosclerosis of aorta: Secondary | ICD-10-CM | POA: Diagnosis not present

## 2019-12-12 DIAGNOSIS — M810 Age-related osteoporosis without current pathological fracture: Secondary | ICD-10-CM | POA: Diagnosis not present

## 2019-12-12 DIAGNOSIS — N1832 Chronic kidney disease, stage 3b: Secondary | ICD-10-CM | POA: Diagnosis not present

## 2019-12-12 DIAGNOSIS — I270 Primary pulmonary hypertension: Secondary | ICD-10-CM | POA: Diagnosis not present

## 2019-12-12 DIAGNOSIS — I129 Hypertensive chronic kidney disease with stage 1 through stage 4 chronic kidney disease, or unspecified chronic kidney disease: Secondary | ICD-10-CM | POA: Diagnosis not present

## 2019-12-12 DIAGNOSIS — G2581 Restless legs syndrome: Secondary | ICD-10-CM | POA: Diagnosis not present

## 2019-12-12 DIAGNOSIS — M150 Primary generalized (osteo)arthritis: Secondary | ICD-10-CM | POA: Diagnosis not present

## 2019-12-12 DIAGNOSIS — I2510 Atherosclerotic heart disease of native coronary artery without angina pectoris: Secondary | ICD-10-CM | POA: Diagnosis not present

## 2019-12-12 DIAGNOSIS — R69 Illness, unspecified: Secondary | ICD-10-CM | POA: Diagnosis not present

## 2019-12-12 DIAGNOSIS — I442 Atrioventricular block, complete: Secondary | ICD-10-CM | POA: Diagnosis not present

## 2019-12-13 DIAGNOSIS — I700 Atherosclerosis of aorta: Secondary | ICD-10-CM | POA: Diagnosis not present

## 2019-12-13 DIAGNOSIS — N1832 Chronic kidney disease, stage 3b: Secondary | ICD-10-CM | POA: Diagnosis not present

## 2019-12-13 DIAGNOSIS — I2510 Atherosclerotic heart disease of native coronary artery without angina pectoris: Secondary | ICD-10-CM | POA: Diagnosis not present

## 2019-12-13 DIAGNOSIS — I270 Primary pulmonary hypertension: Secondary | ICD-10-CM | POA: Diagnosis not present

## 2019-12-13 DIAGNOSIS — G2581 Restless legs syndrome: Secondary | ICD-10-CM | POA: Diagnosis not present

## 2019-12-13 DIAGNOSIS — I129 Hypertensive chronic kidney disease with stage 1 through stage 4 chronic kidney disease, or unspecified chronic kidney disease: Secondary | ICD-10-CM | POA: Diagnosis not present

## 2019-12-13 DIAGNOSIS — I442 Atrioventricular block, complete: Secondary | ICD-10-CM | POA: Diagnosis not present

## 2019-12-13 DIAGNOSIS — R69 Illness, unspecified: Secondary | ICD-10-CM | POA: Diagnosis not present

## 2019-12-13 DIAGNOSIS — M810 Age-related osteoporosis without current pathological fracture: Secondary | ICD-10-CM | POA: Diagnosis not present

## 2019-12-13 DIAGNOSIS — M150 Primary generalized (osteo)arthritis: Secondary | ICD-10-CM | POA: Diagnosis not present

## 2019-12-28 ENCOUNTER — Telehealth: Payer: Self-pay

## 2019-12-28 NOTE — Telephone Encounter (Signed)
I gave the pt caregiver verbal instructions on how to send a transmission. I gave her number to the device clinic to call to get help to send the transmissions as well.

## 2020-01-01 ENCOUNTER — Ambulatory Visit (INDEPENDENT_AMBULATORY_CARE_PROVIDER_SITE_OTHER): Payer: Medicare HMO

## 2020-01-01 DIAGNOSIS — I442 Atrioventricular block, complete: Secondary | ICD-10-CM | POA: Diagnosis not present

## 2020-01-02 LAB — CUP PACEART REMOTE DEVICE CHECK
Battery Remaining Longevity: 119 mo
Battery Remaining Percentage: 95.5 %
Battery Voltage: 3.02 V
Brady Statistic AP VP Percent: 1 %
Brady Statistic AP VS Percent: 1 %
Brady Statistic AS VP Percent: 99 %
Brady Statistic AS VS Percent: 1 %
Brady Statistic RA Percent Paced: 1 %
Brady Statistic RV Percent Paced: 99 %
Date Time Interrogation Session: 20211102131412
Implantable Lead Implant Date: 20200326
Implantable Lead Implant Date: 20200326
Implantable Lead Location: 753859
Implantable Lead Location: 753860
Implantable Pulse Generator Implant Date: 20200326
Lead Channel Impedance Value: 390 Ohm
Lead Channel Impedance Value: 400 Ohm
Lead Channel Pacing Threshold Amplitude: 0.625 V
Lead Channel Pacing Threshold Amplitude: 0.875 V
Lead Channel Pacing Threshold Pulse Width: 0.5 ms
Lead Channel Pacing Threshold Pulse Width: 0.5 ms
Lead Channel Sensing Intrinsic Amplitude: 2.8 mV
Lead Channel Sensing Intrinsic Amplitude: 7 mV
Lead Channel Setting Pacing Amplitude: 0.875
Lead Channel Setting Pacing Amplitude: 1.875
Lead Channel Setting Pacing Pulse Width: 0.5 ms
Lead Channel Setting Sensing Sensitivity: 2 mV
Pulse Gen Model: 2272
Pulse Gen Serial Number: 3299681

## 2020-01-03 NOTE — Progress Notes (Signed)
Remote pacemaker transmission.   

## 2020-04-01 ENCOUNTER — Ambulatory Visit (INDEPENDENT_AMBULATORY_CARE_PROVIDER_SITE_OTHER): Payer: Medicare HMO

## 2020-04-01 DIAGNOSIS — I442 Atrioventricular block, complete: Secondary | ICD-10-CM | POA: Diagnosis not present

## 2020-04-01 LAB — CUP PACEART REMOTE DEVICE CHECK
Battery Remaining Longevity: 117 mo
Battery Remaining Percentage: 95.5 %
Battery Voltage: 3.02 V
Brady Statistic AP VP Percent: 1 %
Brady Statistic AP VS Percent: 1 %
Brady Statistic AS VP Percent: 98 %
Brady Statistic AS VS Percent: 1.1 %
Brady Statistic RA Percent Paced: 1 %
Brady Statistic RV Percent Paced: 99 %
Date Time Interrogation Session: 20220201020012
Implantable Lead Implant Date: 20200326
Implantable Lead Implant Date: 20200326
Implantable Lead Location: 753859
Implantable Lead Location: 753860
Implantable Pulse Generator Implant Date: 20200326
Lead Channel Impedance Value: 390 Ohm
Lead Channel Impedance Value: 400 Ohm
Lead Channel Pacing Threshold Amplitude: 0.75 V
Lead Channel Pacing Threshold Amplitude: 1.875 V
Lead Channel Pacing Threshold Pulse Width: 0.5 ms
Lead Channel Pacing Threshold Pulse Width: 0.5 ms
Lead Channel Sensing Intrinsic Amplitude: 1.5 mV
Lead Channel Sensing Intrinsic Amplitude: 8.3 mV
Lead Channel Setting Pacing Amplitude: 1 V
Lead Channel Setting Pacing Amplitude: 3.375
Lead Channel Setting Pacing Pulse Width: 0.5 ms
Lead Channel Setting Sensing Sensitivity: 2 mV
Pulse Gen Model: 2272
Pulse Gen Serial Number: 3299681

## 2020-04-09 NOTE — Progress Notes (Signed)
Remote pacemaker transmission.   

## 2020-05-01 DIAGNOSIS — M150 Primary generalized (osteo)arthritis: Secondary | ICD-10-CM | POA: Diagnosis not present

## 2020-05-01 DIAGNOSIS — I2510 Atherosclerotic heart disease of native coronary artery without angina pectoris: Secondary | ICD-10-CM | POA: Diagnosis not present

## 2020-05-01 DIAGNOSIS — K5730 Diverticulosis of large intestine without perforation or abscess without bleeding: Secondary | ICD-10-CM | POA: Diagnosis not present

## 2020-05-01 DIAGNOSIS — R69 Illness, unspecified: Secondary | ICD-10-CM | POA: Diagnosis not present

## 2020-05-01 DIAGNOSIS — N1832 Chronic kidney disease, stage 3b: Secondary | ICD-10-CM | POA: Diagnosis not present

## 2020-05-01 DIAGNOSIS — E782 Mixed hyperlipidemia: Secondary | ICD-10-CM | POA: Diagnosis not present

## 2020-05-01 DIAGNOSIS — I270 Primary pulmonary hypertension: Secondary | ICD-10-CM | POA: Diagnosis not present

## 2020-05-15 DIAGNOSIS — J180 Bronchopneumonia, unspecified organism: Secondary | ICD-10-CM | POA: Diagnosis not present

## 2020-05-15 DIAGNOSIS — R410 Disorientation, unspecified: Secondary | ICD-10-CM | POA: Diagnosis not present

## 2020-05-19 DIAGNOSIS — J189 Pneumonia, unspecified organism: Secondary | ICD-10-CM | POA: Diagnosis not present

## 2020-05-22 DIAGNOSIS — I700 Atherosclerosis of aorta: Secondary | ICD-10-CM | POA: Diagnosis not present

## 2020-05-22 DIAGNOSIS — G2581 Restless legs syndrome: Secondary | ICD-10-CM | POA: Diagnosis not present

## 2020-05-22 DIAGNOSIS — I70208 Unspecified atherosclerosis of native arteries of extremities, other extremity: Secondary | ICD-10-CM | POA: Diagnosis not present

## 2020-05-22 DIAGNOSIS — M810 Age-related osteoporosis without current pathological fracture: Secondary | ICD-10-CM | POA: Diagnosis not present

## 2020-05-22 DIAGNOSIS — R32 Unspecified urinary incontinence: Secondary | ICD-10-CM | POA: Diagnosis not present

## 2020-05-22 DIAGNOSIS — R69 Illness, unspecified: Secondary | ICD-10-CM | POA: Diagnosis not present

## 2020-05-22 DIAGNOSIS — I129 Hypertensive chronic kidney disease with stage 1 through stage 4 chronic kidney disease, or unspecified chronic kidney disease: Secondary | ICD-10-CM | POA: Diagnosis not present

## 2020-05-22 DIAGNOSIS — E782 Mixed hyperlipidemia: Secondary | ICD-10-CM | POA: Diagnosis not present

## 2020-05-22 DIAGNOSIS — J189 Pneumonia, unspecified organism: Secondary | ICD-10-CM | POA: Diagnosis not present

## 2020-05-22 DIAGNOSIS — N1832 Chronic kidney disease, stage 3b: Secondary | ICD-10-CM | POA: Diagnosis not present

## 2020-05-22 DIAGNOSIS — I272 Pulmonary hypertension, unspecified: Secondary | ICD-10-CM | POA: Diagnosis not present

## 2020-05-27 DIAGNOSIS — N1832 Chronic kidney disease, stage 3b: Secondary | ICD-10-CM | POA: Diagnosis not present

## 2020-05-27 DIAGNOSIS — I129 Hypertensive chronic kidney disease with stage 1 through stage 4 chronic kidney disease, or unspecified chronic kidney disease: Secondary | ICD-10-CM | POA: Diagnosis not present

## 2020-05-27 DIAGNOSIS — R32 Unspecified urinary incontinence: Secondary | ICD-10-CM | POA: Diagnosis not present

## 2020-05-27 DIAGNOSIS — J189 Pneumonia, unspecified organism: Secondary | ICD-10-CM | POA: Diagnosis not present

## 2020-05-27 DIAGNOSIS — I272 Pulmonary hypertension, unspecified: Secondary | ICD-10-CM | POA: Diagnosis not present

## 2020-05-27 DIAGNOSIS — R69 Illness, unspecified: Secondary | ICD-10-CM | POA: Diagnosis not present

## 2020-05-27 DIAGNOSIS — M810 Age-related osteoporosis without current pathological fracture: Secondary | ICD-10-CM | POA: Diagnosis not present

## 2020-05-27 DIAGNOSIS — I70208 Unspecified atherosclerosis of native arteries of extremities, other extremity: Secondary | ICD-10-CM | POA: Diagnosis not present

## 2020-05-27 DIAGNOSIS — G2581 Restless legs syndrome: Secondary | ICD-10-CM | POA: Diagnosis not present

## 2020-05-27 DIAGNOSIS — I700 Atherosclerosis of aorta: Secondary | ICD-10-CM | POA: Diagnosis not present

## 2020-05-28 DIAGNOSIS — N1832 Chronic kidney disease, stage 3b: Secondary | ICD-10-CM | POA: Diagnosis not present

## 2020-05-28 DIAGNOSIS — J189 Pneumonia, unspecified organism: Secondary | ICD-10-CM | POA: Diagnosis not present

## 2020-05-28 DIAGNOSIS — M810 Age-related osteoporosis without current pathological fracture: Secondary | ICD-10-CM | POA: Diagnosis not present

## 2020-05-28 DIAGNOSIS — I70208 Unspecified atherosclerosis of native arteries of extremities, other extremity: Secondary | ICD-10-CM | POA: Diagnosis not present

## 2020-05-28 DIAGNOSIS — I129 Hypertensive chronic kidney disease with stage 1 through stage 4 chronic kidney disease, or unspecified chronic kidney disease: Secondary | ICD-10-CM | POA: Diagnosis not present

## 2020-05-28 DIAGNOSIS — I272 Pulmonary hypertension, unspecified: Secondary | ICD-10-CM | POA: Diagnosis not present

## 2020-05-28 DIAGNOSIS — I442 Atrioventricular block, complete: Secondary | ICD-10-CM | POA: Diagnosis not present

## 2020-05-28 DIAGNOSIS — R32 Unspecified urinary incontinence: Secondary | ICD-10-CM | POA: Diagnosis not present

## 2020-05-28 DIAGNOSIS — R69 Illness, unspecified: Secondary | ICD-10-CM | POA: Diagnosis not present

## 2020-05-28 DIAGNOSIS — R531 Weakness: Secondary | ICD-10-CM | POA: Diagnosis not present

## 2020-05-28 DIAGNOSIS — I700 Atherosclerosis of aorta: Secondary | ICD-10-CM | POA: Diagnosis not present

## 2020-05-28 DIAGNOSIS — G2581 Restless legs syndrome: Secondary | ICD-10-CM | POA: Diagnosis not present

## 2020-05-29 DIAGNOSIS — I129 Hypertensive chronic kidney disease with stage 1 through stage 4 chronic kidney disease, or unspecified chronic kidney disease: Secondary | ICD-10-CM | POA: Diagnosis not present

## 2020-05-29 DIAGNOSIS — M810 Age-related osteoporosis without current pathological fracture: Secondary | ICD-10-CM | POA: Diagnosis not present

## 2020-05-29 DIAGNOSIS — R69 Illness, unspecified: Secondary | ICD-10-CM | POA: Diagnosis not present

## 2020-05-29 DIAGNOSIS — N1832 Chronic kidney disease, stage 3b: Secondary | ICD-10-CM | POA: Diagnosis not present

## 2020-05-29 DIAGNOSIS — I70208 Unspecified atherosclerosis of native arteries of extremities, other extremity: Secondary | ICD-10-CM | POA: Diagnosis not present

## 2020-05-29 DIAGNOSIS — I272 Pulmonary hypertension, unspecified: Secondary | ICD-10-CM | POA: Diagnosis not present

## 2020-05-29 DIAGNOSIS — R531 Weakness: Secondary | ICD-10-CM | POA: Diagnosis not present

## 2020-05-29 DIAGNOSIS — R32 Unspecified urinary incontinence: Secondary | ICD-10-CM | POA: Diagnosis not present

## 2020-05-29 DIAGNOSIS — G2581 Restless legs syndrome: Secondary | ICD-10-CM | POA: Diagnosis not present

## 2020-05-29 DIAGNOSIS — J189 Pneumonia, unspecified organism: Secondary | ICD-10-CM | POA: Diagnosis not present

## 2020-05-29 DIAGNOSIS — I442 Atrioventricular block, complete: Secondary | ICD-10-CM | POA: Diagnosis not present

## 2020-05-29 DIAGNOSIS — I700 Atherosclerosis of aorta: Secondary | ICD-10-CM | POA: Diagnosis not present

## 2020-05-31 DIAGNOSIS — R69 Illness, unspecified: Secondary | ICD-10-CM | POA: Diagnosis not present

## 2020-05-31 DIAGNOSIS — I700 Atherosclerosis of aorta: Secondary | ICD-10-CM | POA: Diagnosis not present

## 2020-05-31 DIAGNOSIS — R32 Unspecified urinary incontinence: Secondary | ICD-10-CM | POA: Diagnosis not present

## 2020-05-31 DIAGNOSIS — G2581 Restless legs syndrome: Secondary | ICD-10-CM | POA: Diagnosis not present

## 2020-05-31 DIAGNOSIS — I70208 Unspecified atherosclerosis of native arteries of extremities, other extremity: Secondary | ICD-10-CM | POA: Diagnosis not present

## 2020-05-31 DIAGNOSIS — M810 Age-related osteoporosis without current pathological fracture: Secondary | ICD-10-CM | POA: Diagnosis not present

## 2020-05-31 DIAGNOSIS — I272 Pulmonary hypertension, unspecified: Secondary | ICD-10-CM | POA: Diagnosis not present

## 2020-05-31 DIAGNOSIS — I129 Hypertensive chronic kidney disease with stage 1 through stage 4 chronic kidney disease, or unspecified chronic kidney disease: Secondary | ICD-10-CM | POA: Diagnosis not present

## 2020-05-31 DIAGNOSIS — J189 Pneumonia, unspecified organism: Secondary | ICD-10-CM | POA: Diagnosis not present

## 2020-05-31 DIAGNOSIS — N1832 Chronic kidney disease, stage 3b: Secondary | ICD-10-CM | POA: Diagnosis not present

## 2020-06-02 DIAGNOSIS — I700 Atherosclerosis of aorta: Secondary | ICD-10-CM | POA: Diagnosis not present

## 2020-06-02 DIAGNOSIS — I129 Hypertensive chronic kidney disease with stage 1 through stage 4 chronic kidney disease, or unspecified chronic kidney disease: Secondary | ICD-10-CM | POA: Diagnosis not present

## 2020-06-02 DIAGNOSIS — M810 Age-related osteoporosis without current pathological fracture: Secondary | ICD-10-CM | POA: Diagnosis not present

## 2020-06-02 DIAGNOSIS — J189 Pneumonia, unspecified organism: Secondary | ICD-10-CM | POA: Diagnosis not present

## 2020-06-02 DIAGNOSIS — N1832 Chronic kidney disease, stage 3b: Secondary | ICD-10-CM | POA: Diagnosis not present

## 2020-06-02 DIAGNOSIS — G2581 Restless legs syndrome: Secondary | ICD-10-CM | POA: Diagnosis not present

## 2020-06-02 DIAGNOSIS — R69 Illness, unspecified: Secondary | ICD-10-CM | POA: Diagnosis not present

## 2020-06-02 DIAGNOSIS — I272 Pulmonary hypertension, unspecified: Secondary | ICD-10-CM | POA: Diagnosis not present

## 2020-06-02 DIAGNOSIS — I70208 Unspecified atherosclerosis of native arteries of extremities, other extremity: Secondary | ICD-10-CM | POA: Diagnosis not present

## 2020-06-02 DIAGNOSIS — R32 Unspecified urinary incontinence: Secondary | ICD-10-CM | POA: Diagnosis not present

## 2020-06-03 ENCOUNTER — Encounter: Payer: Self-pay | Admitting: *Deleted

## 2020-06-03 DIAGNOSIS — R69 Illness, unspecified: Secondary | ICD-10-CM | POA: Diagnosis not present

## 2020-06-03 DIAGNOSIS — I700 Atherosclerosis of aorta: Secondary | ICD-10-CM | POA: Diagnosis not present

## 2020-06-03 DIAGNOSIS — I70208 Unspecified atherosclerosis of native arteries of extremities, other extremity: Secondary | ICD-10-CM | POA: Diagnosis not present

## 2020-06-03 DIAGNOSIS — I272 Pulmonary hypertension, unspecified: Secondary | ICD-10-CM | POA: Diagnosis not present

## 2020-06-03 DIAGNOSIS — G2581 Restless legs syndrome: Secondary | ICD-10-CM | POA: Diagnosis not present

## 2020-06-03 DIAGNOSIS — R32 Unspecified urinary incontinence: Secondary | ICD-10-CM | POA: Diagnosis not present

## 2020-06-03 DIAGNOSIS — M810 Age-related osteoporosis without current pathological fracture: Secondary | ICD-10-CM | POA: Diagnosis not present

## 2020-06-03 DIAGNOSIS — N1832 Chronic kidney disease, stage 3b: Secondary | ICD-10-CM | POA: Diagnosis not present

## 2020-06-03 DIAGNOSIS — I129 Hypertensive chronic kidney disease with stage 1 through stage 4 chronic kidney disease, or unspecified chronic kidney disease: Secondary | ICD-10-CM | POA: Diagnosis not present

## 2020-06-03 DIAGNOSIS — J189 Pneumonia, unspecified organism: Secondary | ICD-10-CM | POA: Diagnosis not present

## 2020-06-03 NOTE — Progress Notes (Signed)
    Pt was admitted to [facility] of [facility]in the home on 06/03/20.  Pt will be receiving nurse and social work support through this program.  Please contact us with questions.    Ron[facility] [place] Referral Specialist [phone]

## 2020-06-06 DIAGNOSIS — R69 Illness, unspecified: Secondary | ICD-10-CM | POA: Diagnosis not present

## 2020-06-06 DIAGNOSIS — G2581 Restless legs syndrome: Secondary | ICD-10-CM | POA: Diagnosis not present

## 2020-06-06 DIAGNOSIS — N1832 Chronic kidney disease, stage 3b: Secondary | ICD-10-CM | POA: Diagnosis not present

## 2020-06-06 DIAGNOSIS — I70208 Unspecified atherosclerosis of native arteries of extremities, other extremity: Secondary | ICD-10-CM | POA: Diagnosis not present

## 2020-06-06 DIAGNOSIS — I129 Hypertensive chronic kidney disease with stage 1 through stage 4 chronic kidney disease, or unspecified chronic kidney disease: Secondary | ICD-10-CM | POA: Diagnosis not present

## 2020-06-06 DIAGNOSIS — I700 Atherosclerosis of aorta: Secondary | ICD-10-CM | POA: Diagnosis not present

## 2020-06-06 DIAGNOSIS — M810 Age-related osteoporosis without current pathological fracture: Secondary | ICD-10-CM | POA: Diagnosis not present

## 2020-06-06 DIAGNOSIS — R32 Unspecified urinary incontinence: Secondary | ICD-10-CM | POA: Diagnosis not present

## 2020-06-06 DIAGNOSIS — J189 Pneumonia, unspecified organism: Secondary | ICD-10-CM | POA: Diagnosis not present

## 2020-06-06 DIAGNOSIS — I272 Pulmonary hypertension, unspecified: Secondary | ICD-10-CM | POA: Diagnosis not present

## 2020-06-10 DIAGNOSIS — R32 Unspecified urinary incontinence: Secondary | ICD-10-CM | POA: Diagnosis not present

## 2020-06-10 DIAGNOSIS — I700 Atherosclerosis of aorta: Secondary | ICD-10-CM | POA: Diagnosis not present

## 2020-06-10 DIAGNOSIS — J189 Pneumonia, unspecified organism: Secondary | ICD-10-CM | POA: Diagnosis not present

## 2020-06-10 DIAGNOSIS — G2581 Restless legs syndrome: Secondary | ICD-10-CM | POA: Diagnosis not present

## 2020-06-10 DIAGNOSIS — N1832 Chronic kidney disease, stage 3b: Secondary | ICD-10-CM | POA: Diagnosis not present

## 2020-06-10 DIAGNOSIS — I129 Hypertensive chronic kidney disease with stage 1 through stage 4 chronic kidney disease, or unspecified chronic kidney disease: Secondary | ICD-10-CM | POA: Diagnosis not present

## 2020-06-10 DIAGNOSIS — M810 Age-related osteoporosis without current pathological fracture: Secondary | ICD-10-CM | POA: Diagnosis not present

## 2020-06-10 DIAGNOSIS — R69 Illness, unspecified: Secondary | ICD-10-CM | POA: Diagnosis not present

## 2020-06-10 DIAGNOSIS — I70208 Unspecified atherosclerosis of native arteries of extremities, other extremity: Secondary | ICD-10-CM | POA: Diagnosis not present

## 2020-06-10 DIAGNOSIS — I272 Pulmonary hypertension, unspecified: Secondary | ICD-10-CM | POA: Diagnosis not present

## 2020-06-12 DIAGNOSIS — R69 Illness, unspecified: Secondary | ICD-10-CM | POA: Diagnosis not present

## 2020-06-12 DIAGNOSIS — R32 Unspecified urinary incontinence: Secondary | ICD-10-CM | POA: Diagnosis not present

## 2020-06-12 DIAGNOSIS — I272 Pulmonary hypertension, unspecified: Secondary | ICD-10-CM | POA: Diagnosis not present

## 2020-06-12 DIAGNOSIS — I700 Atherosclerosis of aorta: Secondary | ICD-10-CM | POA: Diagnosis not present

## 2020-06-12 DIAGNOSIS — J189 Pneumonia, unspecified organism: Secondary | ICD-10-CM | POA: Diagnosis not present

## 2020-06-12 DIAGNOSIS — I129 Hypertensive chronic kidney disease with stage 1 through stage 4 chronic kidney disease, or unspecified chronic kidney disease: Secondary | ICD-10-CM | POA: Diagnosis not present

## 2020-06-12 DIAGNOSIS — M810 Age-related osteoporosis without current pathological fracture: Secondary | ICD-10-CM | POA: Diagnosis not present

## 2020-06-12 DIAGNOSIS — N1832 Chronic kidney disease, stage 3b: Secondary | ICD-10-CM | POA: Diagnosis not present

## 2020-06-12 DIAGNOSIS — G2581 Restless legs syndrome: Secondary | ICD-10-CM | POA: Diagnosis not present

## 2020-06-12 DIAGNOSIS — I70208 Unspecified atherosclerosis of native arteries of extremities, other extremity: Secondary | ICD-10-CM | POA: Diagnosis not present

## 2020-06-13 DIAGNOSIS — N1832 Chronic kidney disease, stage 3b: Secondary | ICD-10-CM | POA: Diagnosis not present

## 2020-06-13 DIAGNOSIS — I700 Atherosclerosis of aorta: Secondary | ICD-10-CM | POA: Diagnosis not present

## 2020-06-13 DIAGNOSIS — R32 Unspecified urinary incontinence: Secondary | ICD-10-CM | POA: Diagnosis not present

## 2020-06-13 DIAGNOSIS — I272 Pulmonary hypertension, unspecified: Secondary | ICD-10-CM | POA: Diagnosis not present

## 2020-06-13 DIAGNOSIS — I70208 Unspecified atherosclerosis of native arteries of extremities, other extremity: Secondary | ICD-10-CM | POA: Diagnosis not present

## 2020-06-13 DIAGNOSIS — M810 Age-related osteoporosis without current pathological fracture: Secondary | ICD-10-CM | POA: Diagnosis not present

## 2020-06-13 DIAGNOSIS — I129 Hypertensive chronic kidney disease with stage 1 through stage 4 chronic kidney disease, or unspecified chronic kidney disease: Secondary | ICD-10-CM | POA: Diagnosis not present

## 2020-06-13 DIAGNOSIS — G2581 Restless legs syndrome: Secondary | ICD-10-CM | POA: Diagnosis not present

## 2020-06-13 DIAGNOSIS — J189 Pneumonia, unspecified organism: Secondary | ICD-10-CM | POA: Diagnosis not present

## 2020-06-13 DIAGNOSIS — R69 Illness, unspecified: Secondary | ICD-10-CM | POA: Diagnosis not present

## 2020-06-19 DIAGNOSIS — R69 Illness, unspecified: Secondary | ICD-10-CM | POA: Diagnosis not present

## 2020-06-19 DIAGNOSIS — I129 Hypertensive chronic kidney disease with stage 1 through stage 4 chronic kidney disease, or unspecified chronic kidney disease: Secondary | ICD-10-CM | POA: Diagnosis not present

## 2020-06-19 DIAGNOSIS — R32 Unspecified urinary incontinence: Secondary | ICD-10-CM | POA: Diagnosis not present

## 2020-06-19 DIAGNOSIS — J189 Pneumonia, unspecified organism: Secondary | ICD-10-CM | POA: Diagnosis not present

## 2020-06-19 DIAGNOSIS — N1832 Chronic kidney disease, stage 3b: Secondary | ICD-10-CM | POA: Diagnosis not present

## 2020-06-19 DIAGNOSIS — I272 Pulmonary hypertension, unspecified: Secondary | ICD-10-CM | POA: Diagnosis not present

## 2020-06-19 DIAGNOSIS — I700 Atherosclerosis of aorta: Secondary | ICD-10-CM | POA: Diagnosis not present

## 2020-06-19 DIAGNOSIS — M810 Age-related osteoporosis without current pathological fracture: Secondary | ICD-10-CM | POA: Diagnosis not present

## 2020-06-19 DIAGNOSIS — I70208 Unspecified atherosclerosis of native arteries of extremities, other extremity: Secondary | ICD-10-CM | POA: Diagnosis not present

## 2020-06-19 DIAGNOSIS — G2581 Restless legs syndrome: Secondary | ICD-10-CM | POA: Diagnosis not present

## 2020-06-20 DIAGNOSIS — I129 Hypertensive chronic kidney disease with stage 1 through stage 4 chronic kidney disease, or unspecified chronic kidney disease: Secondary | ICD-10-CM | POA: Diagnosis not present

## 2020-06-20 DIAGNOSIS — I272 Pulmonary hypertension, unspecified: Secondary | ICD-10-CM | POA: Diagnosis not present

## 2020-06-20 DIAGNOSIS — G2581 Restless legs syndrome: Secondary | ICD-10-CM | POA: Diagnosis not present

## 2020-06-20 DIAGNOSIS — N1832 Chronic kidney disease, stage 3b: Secondary | ICD-10-CM | POA: Diagnosis not present

## 2020-06-20 DIAGNOSIS — R69 Illness, unspecified: Secondary | ICD-10-CM | POA: Diagnosis not present

## 2020-06-20 DIAGNOSIS — I70208 Unspecified atherosclerosis of native arteries of extremities, other extremity: Secondary | ICD-10-CM | POA: Diagnosis not present

## 2020-06-20 DIAGNOSIS — R32 Unspecified urinary incontinence: Secondary | ICD-10-CM | POA: Diagnosis not present

## 2020-06-20 DIAGNOSIS — I700 Atherosclerosis of aorta: Secondary | ICD-10-CM | POA: Diagnosis not present

## 2020-06-20 DIAGNOSIS — M810 Age-related osteoporosis without current pathological fracture: Secondary | ICD-10-CM | POA: Diagnosis not present

## 2020-06-20 DIAGNOSIS — J189 Pneumonia, unspecified organism: Secondary | ICD-10-CM | POA: Diagnosis not present

## 2020-06-25 DIAGNOSIS — J189 Pneumonia, unspecified organism: Secondary | ICD-10-CM | POA: Diagnosis not present

## 2020-06-25 DIAGNOSIS — M810 Age-related osteoporosis without current pathological fracture: Secondary | ICD-10-CM | POA: Diagnosis not present

## 2020-06-25 DIAGNOSIS — I272 Pulmonary hypertension, unspecified: Secondary | ICD-10-CM | POA: Diagnosis not present

## 2020-06-25 DIAGNOSIS — G2581 Restless legs syndrome: Secondary | ICD-10-CM | POA: Diagnosis not present

## 2020-06-25 DIAGNOSIS — R69 Illness, unspecified: Secondary | ICD-10-CM | POA: Diagnosis not present

## 2020-06-25 DIAGNOSIS — R32 Unspecified urinary incontinence: Secondary | ICD-10-CM | POA: Diagnosis not present

## 2020-06-25 DIAGNOSIS — I129 Hypertensive chronic kidney disease with stage 1 through stage 4 chronic kidney disease, or unspecified chronic kidney disease: Secondary | ICD-10-CM | POA: Diagnosis not present

## 2020-06-25 DIAGNOSIS — I70208 Unspecified atherosclerosis of native arteries of extremities, other extremity: Secondary | ICD-10-CM | POA: Diagnosis not present

## 2020-06-25 DIAGNOSIS — I700 Atherosclerosis of aorta: Secondary | ICD-10-CM | POA: Diagnosis not present

## 2020-06-25 DIAGNOSIS — N1832 Chronic kidney disease, stage 3b: Secondary | ICD-10-CM | POA: Diagnosis not present

## 2020-06-26 DIAGNOSIS — I129 Hypertensive chronic kidney disease with stage 1 through stage 4 chronic kidney disease, or unspecified chronic kidney disease: Secondary | ICD-10-CM | POA: Diagnosis not present

## 2020-06-26 DIAGNOSIS — R69 Illness, unspecified: Secondary | ICD-10-CM | POA: Diagnosis not present

## 2020-06-26 DIAGNOSIS — I700 Atherosclerosis of aorta: Secondary | ICD-10-CM | POA: Diagnosis not present

## 2020-06-26 DIAGNOSIS — J189 Pneumonia, unspecified organism: Secondary | ICD-10-CM | POA: Diagnosis not present

## 2020-06-26 DIAGNOSIS — I272 Pulmonary hypertension, unspecified: Secondary | ICD-10-CM | POA: Diagnosis not present

## 2020-06-26 DIAGNOSIS — N1832 Chronic kidney disease, stage 3b: Secondary | ICD-10-CM | POA: Diagnosis not present

## 2020-06-26 DIAGNOSIS — I70208 Unspecified atherosclerosis of native arteries of extremities, other extremity: Secondary | ICD-10-CM | POA: Diagnosis not present

## 2020-06-26 DIAGNOSIS — R32 Unspecified urinary incontinence: Secondary | ICD-10-CM | POA: Diagnosis not present

## 2020-06-26 DIAGNOSIS — G2581 Restless legs syndrome: Secondary | ICD-10-CM | POA: Diagnosis not present

## 2020-06-26 DIAGNOSIS — M810 Age-related osteoporosis without current pathological fracture: Secondary | ICD-10-CM | POA: Diagnosis not present

## 2020-06-28 DIAGNOSIS — R531 Weakness: Secondary | ICD-10-CM | POA: Diagnosis not present

## 2020-06-28 DIAGNOSIS — N1832 Chronic kidney disease, stage 3b: Secondary | ICD-10-CM | POA: Diagnosis not present

## 2020-06-28 DIAGNOSIS — M810 Age-related osteoporosis without current pathological fracture: Secondary | ICD-10-CM | POA: Diagnosis not present

## 2020-06-28 DIAGNOSIS — R69 Illness, unspecified: Secondary | ICD-10-CM | POA: Diagnosis not present

## 2020-06-28 DIAGNOSIS — I442 Atrioventricular block, complete: Secondary | ICD-10-CM | POA: Diagnosis not present

## 2020-07-01 ENCOUNTER — Ambulatory Visit (INDEPENDENT_AMBULATORY_CARE_PROVIDER_SITE_OTHER): Payer: Medicare HMO

## 2020-07-01 DIAGNOSIS — R32 Unspecified urinary incontinence: Secondary | ICD-10-CM | POA: Diagnosis not present

## 2020-07-01 DIAGNOSIS — I442 Atrioventricular block, complete: Secondary | ICD-10-CM

## 2020-07-01 DIAGNOSIS — N1832 Chronic kidney disease, stage 3b: Secondary | ICD-10-CM | POA: Diagnosis not present

## 2020-07-01 DIAGNOSIS — R69 Illness, unspecified: Secondary | ICD-10-CM | POA: Diagnosis not present

## 2020-07-01 DIAGNOSIS — J189 Pneumonia, unspecified organism: Secondary | ICD-10-CM | POA: Diagnosis not present

## 2020-07-01 DIAGNOSIS — G2581 Restless legs syndrome: Secondary | ICD-10-CM | POA: Diagnosis not present

## 2020-07-01 DIAGNOSIS — I700 Atherosclerosis of aorta: Secondary | ICD-10-CM | POA: Diagnosis not present

## 2020-07-01 DIAGNOSIS — I272 Pulmonary hypertension, unspecified: Secondary | ICD-10-CM | POA: Diagnosis not present

## 2020-07-01 DIAGNOSIS — I129 Hypertensive chronic kidney disease with stage 1 through stage 4 chronic kidney disease, or unspecified chronic kidney disease: Secondary | ICD-10-CM | POA: Diagnosis not present

## 2020-07-01 DIAGNOSIS — M810 Age-related osteoporosis without current pathological fracture: Secondary | ICD-10-CM | POA: Diagnosis not present

## 2020-07-01 DIAGNOSIS — I70208 Unspecified atherosclerosis of native arteries of extremities, other extremity: Secondary | ICD-10-CM | POA: Diagnosis not present

## 2020-07-01 LAB — CUP PACEART REMOTE DEVICE CHECK
Battery Remaining Longevity: 115 mo
Battery Remaining Percentage: 95.5 %
Battery Voltage: 3.02 V
Brady Statistic AP VP Percent: 1 %
Brady Statistic AP VS Percent: 1 %
Brady Statistic AS VP Percent: 98 %
Brady Statistic AS VS Percent: 1 %
Brady Statistic RA Percent Paced: 1 %
Brady Statistic RV Percent Paced: 99 %
Date Time Interrogation Session: 20220503020024
Implantable Lead Implant Date: 20200326
Implantable Lead Implant Date: 20200326
Implantable Lead Location: 753859
Implantable Lead Location: 753860
Implantable Pulse Generator Implant Date: 20200326
Lead Channel Impedance Value: 390 Ohm
Lead Channel Impedance Value: 390 Ohm
Lead Channel Pacing Threshold Amplitude: 0.875 V
Lead Channel Pacing Threshold Amplitude: 1.5 V
Lead Channel Pacing Threshold Pulse Width: 0.5 ms
Lead Channel Pacing Threshold Pulse Width: 0.5 ms
Lead Channel Sensing Intrinsic Amplitude: 10.8 mV
Lead Channel Sensing Intrinsic Amplitude: 2 mV
Lead Channel Setting Pacing Amplitude: 1.125
Lead Channel Setting Pacing Amplitude: 2.5 V
Lead Channel Setting Pacing Pulse Width: 0.5 ms
Lead Channel Setting Sensing Sensitivity: 2 mV
Pulse Gen Model: 2272
Pulse Gen Serial Number: 3299681

## 2020-07-02 DIAGNOSIS — I129 Hypertensive chronic kidney disease with stage 1 through stage 4 chronic kidney disease, or unspecified chronic kidney disease: Secondary | ICD-10-CM | POA: Diagnosis not present

## 2020-07-02 DIAGNOSIS — N1832 Chronic kidney disease, stage 3b: Secondary | ICD-10-CM | POA: Diagnosis not present

## 2020-07-02 DIAGNOSIS — G2581 Restless legs syndrome: Secondary | ICD-10-CM | POA: Diagnosis not present

## 2020-07-02 DIAGNOSIS — M810 Age-related osteoporosis without current pathological fracture: Secondary | ICD-10-CM | POA: Diagnosis not present

## 2020-07-02 DIAGNOSIS — R32 Unspecified urinary incontinence: Secondary | ICD-10-CM | POA: Diagnosis not present

## 2020-07-02 DIAGNOSIS — I700 Atherosclerosis of aorta: Secondary | ICD-10-CM | POA: Diagnosis not present

## 2020-07-02 DIAGNOSIS — I70208 Unspecified atherosclerosis of native arteries of extremities, other extremity: Secondary | ICD-10-CM | POA: Diagnosis not present

## 2020-07-02 DIAGNOSIS — J189 Pneumonia, unspecified organism: Secondary | ICD-10-CM | POA: Diagnosis not present

## 2020-07-02 DIAGNOSIS — I272 Pulmonary hypertension, unspecified: Secondary | ICD-10-CM | POA: Diagnosis not present

## 2020-07-02 DIAGNOSIS — R69 Illness, unspecified: Secondary | ICD-10-CM | POA: Diagnosis not present

## 2020-07-08 ENCOUNTER — Telehealth: Payer: Self-pay | Admitting: Cardiology

## 2020-07-08 NOTE — Telephone Encounter (Signed)
PT's niece is calling in to make aware that the pt is bed ridden and there are nurses coming in home to take care of her.Please advise

## 2020-07-09 DIAGNOSIS — I700 Atherosclerosis of aorta: Secondary | ICD-10-CM | POA: Diagnosis not present

## 2020-07-09 DIAGNOSIS — I70208 Unspecified atherosclerosis of native arteries of extremities, other extremity: Secondary | ICD-10-CM | POA: Diagnosis not present

## 2020-07-09 DIAGNOSIS — J189 Pneumonia, unspecified organism: Secondary | ICD-10-CM | POA: Diagnosis not present

## 2020-07-09 DIAGNOSIS — N1832 Chronic kidney disease, stage 3b: Secondary | ICD-10-CM | POA: Diagnosis not present

## 2020-07-09 DIAGNOSIS — G2581 Restless legs syndrome: Secondary | ICD-10-CM | POA: Diagnosis not present

## 2020-07-09 DIAGNOSIS — R69 Illness, unspecified: Secondary | ICD-10-CM | POA: Diagnosis not present

## 2020-07-09 DIAGNOSIS — I272 Pulmonary hypertension, unspecified: Secondary | ICD-10-CM | POA: Diagnosis not present

## 2020-07-09 DIAGNOSIS — I129 Hypertensive chronic kidney disease with stage 1 through stage 4 chronic kidney disease, or unspecified chronic kidney disease: Secondary | ICD-10-CM | POA: Diagnosis not present

## 2020-07-09 DIAGNOSIS — M810 Age-related osteoporosis without current pathological fracture: Secondary | ICD-10-CM | POA: Diagnosis not present

## 2020-07-09 DIAGNOSIS — R32 Unspecified urinary incontinence: Secondary | ICD-10-CM | POA: Diagnosis not present

## 2020-07-18 DIAGNOSIS — I70208 Unspecified atherosclerosis of native arteries of extremities, other extremity: Secondary | ICD-10-CM | POA: Diagnosis not present

## 2020-07-18 DIAGNOSIS — G2581 Restless legs syndrome: Secondary | ICD-10-CM | POA: Diagnosis not present

## 2020-07-18 DIAGNOSIS — I129 Hypertensive chronic kidney disease with stage 1 through stage 4 chronic kidney disease, or unspecified chronic kidney disease: Secondary | ICD-10-CM | POA: Diagnosis not present

## 2020-07-18 DIAGNOSIS — J189 Pneumonia, unspecified organism: Secondary | ICD-10-CM | POA: Diagnosis not present

## 2020-07-18 DIAGNOSIS — R32 Unspecified urinary incontinence: Secondary | ICD-10-CM | POA: Diagnosis not present

## 2020-07-18 DIAGNOSIS — I700 Atherosclerosis of aorta: Secondary | ICD-10-CM | POA: Diagnosis not present

## 2020-07-18 DIAGNOSIS — R69 Illness, unspecified: Secondary | ICD-10-CM | POA: Diagnosis not present

## 2020-07-18 DIAGNOSIS — I272 Pulmonary hypertension, unspecified: Secondary | ICD-10-CM | POA: Diagnosis not present

## 2020-07-18 DIAGNOSIS — N1832 Chronic kidney disease, stage 3b: Secondary | ICD-10-CM | POA: Diagnosis not present

## 2020-07-18 DIAGNOSIS — M810 Age-related osteoporosis without current pathological fracture: Secondary | ICD-10-CM | POA: Diagnosis not present

## 2020-07-22 NOTE — Progress Notes (Signed)
Remote pacemaker transmission.   

## 2020-07-24 DIAGNOSIS — I272 Pulmonary hypertension, unspecified: Secondary | ICD-10-CM | POA: Diagnosis not present

## 2020-07-24 DIAGNOSIS — G2581 Restless legs syndrome: Secondary | ICD-10-CM | POA: Diagnosis not present

## 2020-07-24 DIAGNOSIS — E782 Mixed hyperlipidemia: Secondary | ICD-10-CM | POA: Diagnosis not present

## 2020-07-24 DIAGNOSIS — N1832 Chronic kidney disease, stage 3b: Secondary | ICD-10-CM | POA: Diagnosis not present

## 2020-07-24 DIAGNOSIS — I129 Hypertensive chronic kidney disease with stage 1 through stage 4 chronic kidney disease, or unspecified chronic kidney disease: Secondary | ICD-10-CM | POA: Diagnosis not present

## 2020-07-24 DIAGNOSIS — M810 Age-related osteoporosis without current pathological fracture: Secondary | ICD-10-CM | POA: Diagnosis not present

## 2020-07-24 DIAGNOSIS — I700 Atherosclerosis of aorta: Secondary | ICD-10-CM | POA: Diagnosis not present

## 2020-07-24 DIAGNOSIS — I70208 Unspecified atherosclerosis of native arteries of extremities, other extremity: Secondary | ICD-10-CM | POA: Diagnosis not present

## 2020-07-24 DIAGNOSIS — R32 Unspecified urinary incontinence: Secondary | ICD-10-CM | POA: Diagnosis not present

## 2020-07-24 DIAGNOSIS — R69 Illness, unspecified: Secondary | ICD-10-CM | POA: Diagnosis not present

## 2020-07-25 DIAGNOSIS — I272 Pulmonary hypertension, unspecified: Secondary | ICD-10-CM | POA: Diagnosis not present

## 2020-07-25 DIAGNOSIS — I70208 Unspecified atherosclerosis of native arteries of extremities, other extremity: Secondary | ICD-10-CM | POA: Diagnosis not present

## 2020-07-25 DIAGNOSIS — I700 Atherosclerosis of aorta: Secondary | ICD-10-CM | POA: Diagnosis not present

## 2020-07-25 DIAGNOSIS — R32 Unspecified urinary incontinence: Secondary | ICD-10-CM | POA: Diagnosis not present

## 2020-07-25 DIAGNOSIS — N1832 Chronic kidney disease, stage 3b: Secondary | ICD-10-CM | POA: Diagnosis not present

## 2020-07-25 DIAGNOSIS — G2581 Restless legs syndrome: Secondary | ICD-10-CM | POA: Diagnosis not present

## 2020-07-25 DIAGNOSIS — R69 Illness, unspecified: Secondary | ICD-10-CM | POA: Diagnosis not present

## 2020-07-25 DIAGNOSIS — I129 Hypertensive chronic kidney disease with stage 1 through stage 4 chronic kidney disease, or unspecified chronic kidney disease: Secondary | ICD-10-CM | POA: Diagnosis not present

## 2020-07-25 DIAGNOSIS — E782 Mixed hyperlipidemia: Secondary | ICD-10-CM | POA: Diagnosis not present

## 2020-07-25 DIAGNOSIS — M810 Age-related osteoporosis without current pathological fracture: Secondary | ICD-10-CM | POA: Diagnosis not present

## 2020-07-28 DIAGNOSIS — R69 Illness, unspecified: Secondary | ICD-10-CM | POA: Diagnosis not present

## 2020-07-28 DIAGNOSIS — I442 Atrioventricular block, complete: Secondary | ICD-10-CM | POA: Diagnosis not present

## 2020-07-28 DIAGNOSIS — N1832 Chronic kidney disease, stage 3b: Secondary | ICD-10-CM | POA: Diagnosis not present

## 2020-07-29 DIAGNOSIS — N1832 Chronic kidney disease, stage 3b: Secondary | ICD-10-CM | POA: Diagnosis not present

## 2020-07-29 DIAGNOSIS — I129 Hypertensive chronic kidney disease with stage 1 through stage 4 chronic kidney disease, or unspecified chronic kidney disease: Secondary | ICD-10-CM | POA: Diagnosis not present

## 2020-07-29 DIAGNOSIS — E782 Mixed hyperlipidemia: Secondary | ICD-10-CM | POA: Diagnosis not present

## 2020-07-29 DIAGNOSIS — M810 Age-related osteoporosis without current pathological fracture: Secondary | ICD-10-CM | POA: Diagnosis not present

## 2020-07-31 DIAGNOSIS — I129 Hypertensive chronic kidney disease with stage 1 through stage 4 chronic kidney disease, or unspecified chronic kidney disease: Secondary | ICD-10-CM | POA: Diagnosis not present

## 2020-07-31 DIAGNOSIS — M810 Age-related osteoporosis without current pathological fracture: Secondary | ICD-10-CM | POA: Diagnosis not present

## 2020-07-31 DIAGNOSIS — N1832 Chronic kidney disease, stage 3b: Secondary | ICD-10-CM | POA: Diagnosis not present

## 2020-07-31 DIAGNOSIS — R69 Illness, unspecified: Secondary | ICD-10-CM | POA: Diagnosis not present

## 2020-08-06 DIAGNOSIS — J8410 Pulmonary fibrosis, unspecified: Secondary | ICD-10-CM | POA: Diagnosis not present

## 2020-08-06 DIAGNOSIS — R69 Illness, unspecified: Secondary | ICD-10-CM | POA: Diagnosis not present

## 2020-08-14 DIAGNOSIS — R32 Unspecified urinary incontinence: Secondary | ICD-10-CM | POA: Diagnosis not present

## 2020-08-14 DIAGNOSIS — E782 Mixed hyperlipidemia: Secondary | ICD-10-CM | POA: Diagnosis not present

## 2020-08-14 DIAGNOSIS — J8410 Pulmonary fibrosis, unspecified: Secondary | ICD-10-CM | POA: Diagnosis not present

## 2020-08-14 DIAGNOSIS — I129 Hypertensive chronic kidney disease with stage 1 through stage 4 chronic kidney disease, or unspecified chronic kidney disease: Secondary | ICD-10-CM | POA: Diagnosis not present

## 2020-08-14 DIAGNOSIS — M810 Age-related osteoporosis without current pathological fracture: Secondary | ICD-10-CM | POA: Diagnosis not present

## 2020-08-14 DIAGNOSIS — I70208 Unspecified atherosclerosis of native arteries of extremities, other extremity: Secondary | ICD-10-CM | POA: Diagnosis not present

## 2020-08-14 DIAGNOSIS — G2581 Restless legs syndrome: Secondary | ICD-10-CM | POA: Diagnosis not present

## 2020-08-14 DIAGNOSIS — I700 Atherosclerosis of aorta: Secondary | ICD-10-CM | POA: Diagnosis not present

## 2020-08-14 DIAGNOSIS — R69 Illness, unspecified: Secondary | ICD-10-CM | POA: Diagnosis not present

## 2020-08-14 DIAGNOSIS — N1832 Chronic kidney disease, stage 3b: Secondary | ICD-10-CM | POA: Diagnosis not present

## 2020-08-14 DIAGNOSIS — I272 Pulmonary hypertension, unspecified: Secondary | ICD-10-CM | POA: Diagnosis not present

## 2020-08-14 DIAGNOSIS — R058 Other specified cough: Secondary | ICD-10-CM | POA: Diagnosis not present

## 2020-08-19 DIAGNOSIS — I272 Pulmonary hypertension, unspecified: Secondary | ICD-10-CM | POA: Diagnosis not present

## 2020-08-19 DIAGNOSIS — N1832 Chronic kidney disease, stage 3b: Secondary | ICD-10-CM | POA: Diagnosis not present

## 2020-08-19 DIAGNOSIS — R32 Unspecified urinary incontinence: Secondary | ICD-10-CM | POA: Diagnosis not present

## 2020-08-19 DIAGNOSIS — R69 Illness, unspecified: Secondary | ICD-10-CM | POA: Diagnosis not present

## 2020-08-19 DIAGNOSIS — I700 Atherosclerosis of aorta: Secondary | ICD-10-CM | POA: Diagnosis not present

## 2020-08-19 DIAGNOSIS — I129 Hypertensive chronic kidney disease with stage 1 through stage 4 chronic kidney disease, or unspecified chronic kidney disease: Secondary | ICD-10-CM | POA: Diagnosis not present

## 2020-08-19 DIAGNOSIS — G2581 Restless legs syndrome: Secondary | ICD-10-CM | POA: Diagnosis not present

## 2020-08-19 DIAGNOSIS — I70208 Unspecified atherosclerosis of native arteries of extremities, other extremity: Secondary | ICD-10-CM | POA: Diagnosis not present

## 2020-08-19 DIAGNOSIS — M810 Age-related osteoporosis without current pathological fracture: Secondary | ICD-10-CM | POA: Diagnosis not present

## 2020-08-19 DIAGNOSIS — E782 Mixed hyperlipidemia: Secondary | ICD-10-CM | POA: Diagnosis not present

## 2020-08-26 DIAGNOSIS — E782 Mixed hyperlipidemia: Secondary | ICD-10-CM | POA: Diagnosis not present

## 2020-08-26 DIAGNOSIS — N1832 Chronic kidney disease, stage 3b: Secondary | ICD-10-CM | POA: Diagnosis not present

## 2020-08-26 DIAGNOSIS — I70208 Unspecified atherosclerosis of native arteries of extremities, other extremity: Secondary | ICD-10-CM | POA: Diagnosis not present

## 2020-08-26 DIAGNOSIS — I272 Pulmonary hypertension, unspecified: Secondary | ICD-10-CM | POA: Diagnosis not present

## 2020-08-26 DIAGNOSIS — G2581 Restless legs syndrome: Secondary | ICD-10-CM | POA: Diagnosis not present

## 2020-08-26 DIAGNOSIS — R69 Illness, unspecified: Secondary | ICD-10-CM | POA: Diagnosis not present

## 2020-08-26 DIAGNOSIS — R32 Unspecified urinary incontinence: Secondary | ICD-10-CM | POA: Diagnosis not present

## 2020-08-26 DIAGNOSIS — M810 Age-related osteoporosis without current pathological fracture: Secondary | ICD-10-CM | POA: Diagnosis not present

## 2020-08-26 DIAGNOSIS — I129 Hypertensive chronic kidney disease with stage 1 through stage 4 chronic kidney disease, or unspecified chronic kidney disease: Secondary | ICD-10-CM | POA: Diagnosis not present

## 2020-08-26 DIAGNOSIS — I700 Atherosclerosis of aorta: Secondary | ICD-10-CM | POA: Diagnosis not present

## 2020-08-28 DIAGNOSIS — N1832 Chronic kidney disease, stage 3b: Secondary | ICD-10-CM | POA: Diagnosis not present

## 2020-08-28 DIAGNOSIS — R69 Illness, unspecified: Secondary | ICD-10-CM | POA: Diagnosis not present

## 2020-08-28 DIAGNOSIS — I130 Hypertensive heart and chronic kidney disease with heart failure and stage 1 through stage 4 chronic kidney disease, or unspecified chronic kidney disease: Secondary | ICD-10-CM | POA: Diagnosis not present

## 2020-08-28 DIAGNOSIS — I442 Atrioventricular block, complete: Secondary | ICD-10-CM | POA: Diagnosis not present

## 2020-08-28 DIAGNOSIS — I5032 Chronic diastolic (congestive) heart failure: Secondary | ICD-10-CM | POA: Diagnosis not present

## 2020-08-28 DIAGNOSIS — I272 Pulmonary hypertension, unspecified: Secondary | ICD-10-CM | POA: Diagnosis not present

## 2020-09-04 DIAGNOSIS — R69 Illness, unspecified: Secondary | ICD-10-CM | POA: Diagnosis not present

## 2020-09-04 DIAGNOSIS — I272 Pulmonary hypertension, unspecified: Secondary | ICD-10-CM | POA: Diagnosis not present

## 2020-09-04 DIAGNOSIS — E782 Mixed hyperlipidemia: Secondary | ICD-10-CM | POA: Diagnosis not present

## 2020-09-04 DIAGNOSIS — I70208 Unspecified atherosclerosis of native arteries of extremities, other extremity: Secondary | ICD-10-CM | POA: Diagnosis not present

## 2020-09-04 DIAGNOSIS — N1832 Chronic kidney disease, stage 3b: Secondary | ICD-10-CM | POA: Diagnosis not present

## 2020-09-04 DIAGNOSIS — I700 Atherosclerosis of aorta: Secondary | ICD-10-CM | POA: Diagnosis not present

## 2020-09-04 DIAGNOSIS — R32 Unspecified urinary incontinence: Secondary | ICD-10-CM | POA: Diagnosis not present

## 2020-09-04 DIAGNOSIS — I129 Hypertensive chronic kidney disease with stage 1 through stage 4 chronic kidney disease, or unspecified chronic kidney disease: Secondary | ICD-10-CM | POA: Diagnosis not present

## 2020-09-04 DIAGNOSIS — G2581 Restless legs syndrome: Secondary | ICD-10-CM | POA: Diagnosis not present

## 2020-09-04 DIAGNOSIS — M810 Age-related osteoporosis without current pathological fracture: Secondary | ICD-10-CM | POA: Diagnosis not present

## 2020-09-10 DIAGNOSIS — R059 Cough, unspecified: Secondary | ICD-10-CM | POA: Diagnosis not present

## 2020-09-10 DIAGNOSIS — R066 Hiccough: Secondary | ICD-10-CM | POA: Diagnosis not present

## 2020-09-10 DIAGNOSIS — I959 Hypotension, unspecified: Secondary | ICD-10-CM | POA: Diagnosis not present

## 2020-09-15 DIAGNOSIS — R531 Weakness: Secondary | ICD-10-CM | POA: Diagnosis not present

## 2020-09-15 DIAGNOSIS — R0602 Shortness of breath: Secondary | ICD-10-CM | POA: Diagnosis not present

## 2020-09-15 DIAGNOSIS — I272 Pulmonary hypertension, unspecified: Secondary | ICD-10-CM | POA: Diagnosis not present

## 2020-09-15 DIAGNOSIS — J8410 Pulmonary fibrosis, unspecified: Secondary | ICD-10-CM | POA: Diagnosis not present

## 2020-09-15 DIAGNOSIS — R059 Cough, unspecified: Secondary | ICD-10-CM | POA: Diagnosis not present

## 2020-09-15 DIAGNOSIS — R0902 Hypoxemia: Secondary | ICD-10-CM | POA: Diagnosis not present

## 2020-09-15 DIAGNOSIS — M810 Age-related osteoporosis without current pathological fracture: Secondary | ICD-10-CM | POA: Diagnosis not present

## 2020-09-15 DIAGNOSIS — R69 Illness, unspecified: Secondary | ICD-10-CM | POA: Diagnosis not present

## 2020-09-15 DIAGNOSIS — I442 Atrioventricular block, complete: Secondary | ICD-10-CM | POA: Diagnosis not present

## 2020-09-15 DIAGNOSIS — N1832 Chronic kidney disease, stage 3b: Secondary | ICD-10-CM | POA: Diagnosis not present

## 2020-09-15 DIAGNOSIS — I130 Hypertensive heart and chronic kidney disease with heart failure and stage 1 through stage 4 chronic kidney disease, or unspecified chronic kidney disease: Secondary | ICD-10-CM | POA: Diagnosis not present

## 2020-09-15 DIAGNOSIS — I5032 Chronic diastolic (congestive) heart failure: Secondary | ICD-10-CM | POA: Diagnosis not present

## 2020-09-16 DIAGNOSIS — R69 Illness, unspecified: Secondary | ICD-10-CM | POA: Diagnosis not present

## 2020-09-16 DIAGNOSIS — I700 Atherosclerosis of aorta: Secondary | ICD-10-CM | POA: Diagnosis not present

## 2020-09-16 DIAGNOSIS — E782 Mixed hyperlipidemia: Secondary | ICD-10-CM | POA: Diagnosis not present

## 2020-09-16 DIAGNOSIS — I129 Hypertensive chronic kidney disease with stage 1 through stage 4 chronic kidney disease, or unspecified chronic kidney disease: Secondary | ICD-10-CM | POA: Diagnosis not present

## 2020-09-16 DIAGNOSIS — I272 Pulmonary hypertension, unspecified: Secondary | ICD-10-CM | POA: Diagnosis not present

## 2020-09-16 DIAGNOSIS — N1832 Chronic kidney disease, stage 3b: Secondary | ICD-10-CM | POA: Diagnosis not present

## 2020-09-16 DIAGNOSIS — M810 Age-related osteoporosis without current pathological fracture: Secondary | ICD-10-CM | POA: Diagnosis not present

## 2020-09-16 DIAGNOSIS — R32 Unspecified urinary incontinence: Secondary | ICD-10-CM | POA: Diagnosis not present

## 2020-09-16 DIAGNOSIS — G2581 Restless legs syndrome: Secondary | ICD-10-CM | POA: Diagnosis not present

## 2020-09-16 DIAGNOSIS — I70208 Unspecified atherosclerosis of native arteries of extremities, other extremity: Secondary | ICD-10-CM | POA: Diagnosis not present

## 2020-09-28 DIAGNOSIS — I130 Hypertensive heart and chronic kidney disease with heart failure and stage 1 through stage 4 chronic kidney disease, or unspecified chronic kidney disease: Secondary | ICD-10-CM | POA: Diagnosis not present

## 2020-09-28 DIAGNOSIS — I272 Pulmonary hypertension, unspecified: Secondary | ICD-10-CM | POA: Diagnosis not present

## 2020-09-28 DIAGNOSIS — I5032 Chronic diastolic (congestive) heart failure: Secondary | ICD-10-CM | POA: Diagnosis not present

## 2020-09-29 DEATH — deceased

## 2020-10-03 ENCOUNTER — Telehealth: Payer: Self-pay | Admitting: Cardiology

## 2020-10-03 ENCOUNTER — Telehealth: Payer: Self-pay

## 2020-10-03 NOTE — Telephone Encounter (Signed)
Pts niece reaching out to follow up on returning monitor

## 2020-10-03 NOTE — Telephone Encounter (Signed)
I gave the patient niece the number to [facility] tech support to order a return kit for the patient monitor.
